# Patient Record
Sex: Male | Born: 1965 | Race: Black or African American | Hispanic: No | Marital: Single | Smoking: Current some day smoker
Health system: Southern US, Community
[De-identification: ages and names within clinical notes are randomized; demographics above are authoritative.]

## PROBLEM LIST (undated history)

## (undated) ENCOUNTER — Emergency Department (HOSPITAL_COMMUNITY): Payer: Self-pay | Source: Home / Self Care

## (undated) DIAGNOSIS — S9304XA Dislocation of right ankle joint, initial encounter: Secondary | ICD-10-CM

---

## 1997-08-23 ENCOUNTER — Emergency Department (HOSPITAL_COMMUNITY): Admission: EM | Admit: 1997-08-23 | Discharge: 1997-08-23 | Payer: Self-pay | Admitting: Emergency Medicine

## 1997-08-23 ENCOUNTER — Ambulatory Visit (HOSPITAL_BASED_OUTPATIENT_CLINIC_OR_DEPARTMENT_OTHER): Admission: RE | Admit: 1997-08-23 | Discharge: 1997-08-23 | Payer: Self-pay | Admitting: Orthopedic Surgery

## 1998-05-08 ENCOUNTER — Emergency Department (HOSPITAL_COMMUNITY): Admission: EM | Admit: 1998-05-08 | Discharge: 1998-05-08 | Payer: Self-pay | Admitting: Emergency Medicine

## 1998-05-08 ENCOUNTER — Encounter: Payer: Self-pay | Admitting: Emergency Medicine

## 1998-12-19 ENCOUNTER — Encounter: Payer: Self-pay | Admitting: *Deleted

## 1998-12-19 ENCOUNTER — Inpatient Hospital Stay (HOSPITAL_COMMUNITY): Admission: EM | Admit: 1998-12-19 | Discharge: 1998-12-23 | Payer: Self-pay | Admitting: *Deleted

## 1998-12-19 ENCOUNTER — Encounter: Payer: Self-pay | Admitting: General Surgery

## 1998-12-20 ENCOUNTER — Encounter: Payer: Self-pay | Admitting: Surgery

## 1998-12-20 ENCOUNTER — Encounter: Payer: Self-pay | Admitting: General Surgery

## 1998-12-21 ENCOUNTER — Encounter: Payer: Self-pay | Admitting: General Surgery

## 1998-12-22 ENCOUNTER — Encounter: Payer: Self-pay | Admitting: Surgery

## 1998-12-23 ENCOUNTER — Encounter: Payer: Self-pay | Admitting: Surgery

## 2007-03-18 ENCOUNTER — Emergency Department (HOSPITAL_COMMUNITY): Admission: EM | Admit: 2007-03-18 | Discharge: 2007-03-18 | Payer: Self-pay | Admitting: Emergency Medicine
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
## 2012-03-10 ENCOUNTER — Emergency Department (HOSPITAL_COMMUNITY)
Admission: EM | Admit: 2012-03-10 | Discharge: 2012-03-10 | Disposition: A | Discharge status: Home / Self Care | Payer: Self-pay | Attending: Emergency Medicine | Admitting: Emergency Medicine

## 2012-03-10 ENCOUNTER — Encounter (HOSPITAL_COMMUNITY): Payer: Self-pay | Admitting: Emergency Medicine

## 2012-03-10 DIAGNOSIS — H00019 Hordeolum externum unspecified eye, unspecified eyelid: Secondary | ICD-10-CM | POA: Insufficient documentation

## 2012-03-10 MED ORDER — ERYTHROMYCIN 5 MG/GM OP OINT
TOPICAL_OINTMENT | Freq: Four times a day (QID) | OPHTHALMIC | Status: DC
  Administered 2012-03-10: 11:00:00 via OPHTHALMIC
  Filled 2012-03-10: qty 1

## 2012-03-10 NOTE — ED Provider Notes (Signed)
History     CSN: [id number]  Arrival date & time 03/10/12  3086   First MD Initiated Contact with Patient 03/10/12 1000      Chief Complaint  Patient presents with  . Eye Pain    (Consider location/radiation/quality/duration/timing/severity/associated sxs/prior treatment) HPI Comments: 47 year old male presents emergency department complaining of right eyelid swelling worsening for the past 2 weeks. States he's been having intermittent pain on the right eyelid, worse with palpation. He has been trying to apply warm compresses without any change. No discharge. Denies difficulty seeing or any visual disturbance. No eye pain itself. Currently pain rated 4 out of 10 described as achy and dull. He has not tried any over-the-counter medications for his pain. Denies fever or chills.  Patient is a 47 y.o. male presenting with eye pain. The history is provided by the patient.  Eye Pain This is a new problem. The current episode started 1 to 4 weeks ago. The problem occurs constantly. The problem has been unchanged. Pertinent negatives include no visual change. Nothing aggravates the symptoms.    History reviewed. No pertinent past medical history.  History reviewed. No pertinent past surgical history.  No family history on file.  History  Substance Use Topics  . Smoking status: Never Smoker   . Smokeless tobacco: Not on file  . Alcohol Use: No      Review of Systems  Eyes: Negative for pain, discharge, redness and visual disturbance.  All other systems reviewed and are negative.    Allergies  Review of patient's allergies indicates no known allergies.  Home Medications  No current outpatient prescriptions on file.  BP 142/78  Pulse 98  Temp(Src) 97.8 F (36.6 C) (Oral)  SpO2 97%  Physical Exam  Nursing note and vitals reviewed. Constitutional: He is oriented to person, place, and time. He appears well-developed and well-nourished. No distress.  HENT:  Head:  Normocephalic and atraumatic.  Mouth/Throat: Oropharynx is clear and moist.  Eyes: Conjunctivae and EOM are normal. Pupils are equal, round, and reactive to light. Right eye exhibits hordeolum (tender to palpation).  Neck: Normal range of motion.  Cardiovascular: Normal rate, regular rhythm and normal heart sounds.   Pulmonary/Chest: Effort normal and breath sounds normal.  Musculoskeletal: Normal range of motion. He exhibits no edema.  Lymphadenopathy:    He has no cervical adenopathy.  Neurological: He is alert and oriented to person, place, and time.  Skin: Skin is warm and dry.  Psychiatric: He has a normal mood and affect. His behavior is normal.    ED Course  Procedures (including critical care time)  Labs Reviewed - No data to display No results found.   1. Hordeolum       MDM  47 year old male with external hordeolum. No visual disturbance. His eye itself is normal. Erythromycin ophthalmic ointment given. Advised warm compresses. He will followup with ophthalmology. Return precautions discussed. Patient states understanding of plan and is agreeable.       Trevor Mace, PA-C 03/10/12 1056

## 2012-03-10 NOTE — ED Notes (Signed)
Onset 2 weeks ago right eye lid swelling and has been constant with pain intermittent. Pain currently 4/10 achy dull.

## 2012-03-13 NOTE — ED Provider Notes (Signed)
Medical screening examination/treatment/procedure(s) were performed by non-physician practitioner and as supervising physician I was immediately available for consultation/collaboration.   Joya Gaskins, MD 03/13/12 [phone]

## 2018-05-25 ENCOUNTER — Emergency Department (HOSPITAL_COMMUNITY): Payer: No Typology Code available for payment source

## 2018-05-25 ENCOUNTER — Other Ambulatory Visit: Payer: Self-pay

## 2018-05-25 ENCOUNTER — Emergency Department (HOSPITAL_COMMUNITY)
Admission: EM | Admit: 2018-05-25 | Discharge: 2018-05-26 | Disposition: A | Discharge status: Home / Self Care | Payer: No Typology Code available for payment source | Attending: Emergency Medicine | Admitting: Emergency Medicine

## 2018-05-25 ENCOUNTER — Encounter (HOSPITAL_COMMUNITY): Payer: Self-pay

## 2018-05-25 DIAGNOSIS — S82851A Displaced trimalleolar fracture of right lower leg, initial encounter for closed fracture: Secondary | ICD-10-CM

## 2018-05-25 DIAGNOSIS — Y9355 Activity, bike riding: Secondary | ICD-10-CM | POA: Insufficient documentation

## 2018-05-25 DIAGNOSIS — S9304XA Dislocation of right ankle joint, initial encounter: Secondary | ICD-10-CM

## 2018-05-25 DIAGNOSIS — Y907 Blood alcohol level of 200-239 mg/100 ml: Secondary | ICD-10-CM | POA: Diagnosis not present

## 2018-05-25 DIAGNOSIS — M5030 Other cervical disc degeneration, unspecified cervical region: Secondary | ICD-10-CM | POA: Diagnosis not present

## 2018-05-25 DIAGNOSIS — Y999 Unspecified external cause status: Secondary | ICD-10-CM | POA: Diagnosis not present

## 2018-05-25 DIAGNOSIS — F10920 Alcohol use, unspecified with intoxication, uncomplicated: Secondary | ICD-10-CM | POA: Diagnosis not present

## 2018-05-25 DIAGNOSIS — R220 Localized swelling, mass and lump, head: Secondary | ICD-10-CM | POA: Diagnosis not present

## 2018-05-25 DIAGNOSIS — Q899 Congenital malformation, unspecified: Secondary | ICD-10-CM

## 2018-05-25 DIAGNOSIS — Y929 Unspecified place or not applicable: Secondary | ICD-10-CM | POA: Diagnosis not present

## 2018-05-25 DIAGNOSIS — S99911A Unspecified injury of right ankle, initial encounter: Secondary | ICD-10-CM | POA: Diagnosis present

## 2018-05-25 LAB — CBC
HCT: 42.4 % (ref 39.0–52.0)
Hemoglobin: 14.5 g/dL (ref 13.0–17.0)
MCH: 32.3 pg (ref 26.0–34.0)
MCHC: 34.2 g/dL (ref 30.0–36.0)
MCV: 94.4 fL (ref 80.0–100.0)
Platelets: 127 10*3/uL — ABNORMAL LOW (ref 150–400)
RBC: 4.49 MIL/uL (ref 4.22–5.81)
RDW: 13.2 % (ref 11.5–15.5)
WBC: 5.7 10*3/uL (ref 4.0–10.5)
nRBC: 0 % (ref 0.0–0.2)

## 2018-05-25 LAB — BASIC METABOLIC PANEL
Anion gap: 13 (ref 5–15)
BUN: 11 mg/dL (ref 6–20)
CO2: 19 mmol/L — ABNORMAL LOW (ref 22–32)
Calcium: 8.7 mg/dL — ABNORMAL LOW (ref 8.9–10.3)
Chloride: 99 mmol/L (ref 98–111)
Creatinine, Ser: 0.97 mg/dL (ref 0.61–1.24)
GFR calc Af Amer: >60 mL/min (ref >60)
GFR calc non Af Amer: >60 mL/min (ref >60)
Glucose, Bld: 96 mg/dL (ref 70–99)
Potassium: 4.1 mmol/L (ref 3.5–5.1)
Sodium: 131 mmol/L — ABNORMAL LOW (ref 135–145)

## 2018-05-25 LAB — ETHANOL: Alcohol, Ethyl (B): 218 mg/dL — ABNORMAL HIGH (ref <10)

## 2018-05-25 MED ORDER — ETOMIDATE 2 MG/ML IV SOLN
INTRAVENOUS | Status: AC | PRN
  Administered 2018-05-25: 12 mg via INTRAVENOUS

## 2018-05-25 MED ORDER — FENTANYL CITRATE (PF) 100 MCG/2ML IJ SOLN
100.0000 ug | Freq: Once | INTRAMUSCULAR | Status: AC
  Administered 2018-05-25: 100 ug via INTRAVENOUS
  Filled 2018-05-25: qty 2

## 2018-05-25 MED ORDER — FENTANYL CITRATE (PF) 100 MCG/2ML IJ SOLN: 100.0000 ug | Freq: Once | INTRAMUSCULAR | Status: DC

## 2018-05-25 MED ORDER — SODIUM CHLORIDE 0.9 % IV SOLN
INTRAVENOUS | Status: AC | PRN
  Administered 2018-05-25: 1000 mL via INTRAVENOUS

## 2018-05-25 MED ORDER — ETOMIDATE 2 MG/ML IV SOLN
20.0000 mg | Freq: Once | INTRAVENOUS | Status: DC
  Filled 2018-05-25: qty 10
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
## 2018-05-25 NOTE — ED Notes (Signed)
Patient transported to CT 

## 2018-05-25 NOTE — ED Notes (Signed)
Patient transported to X-ray 
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
## 2018-05-26 MED ORDER — OXYCODONE-ACETAMINOPHEN 5-325 MG PO TABS: 1.0000 | ORAL_TABLET | ORAL | 0 refills | Status: DC | PRN

## 2018-05-26 MED ORDER — METHOCARBAMOL 500 MG PO TABS
500.0000 mg | ORAL_TABLET | Freq: Once | ORAL | Status: AC
  Administered 2018-05-26: 500 mg via ORAL
  Filled 2018-05-26: qty 1

## 2018-05-26 MED ORDER — KETOROLAC TROMETHAMINE 30 MG/ML IJ SOLN
30.0000 mg | Freq: Once | INTRAMUSCULAR | Status: AC
  Administered 2018-05-26: 30 mg via INTRAVENOUS
  Filled 2018-05-26: qty 1

## 2018-05-26 MED ORDER — OXYCODONE HCL 5 MG PO TABS
5.0000 mg | ORAL_TABLET | Freq: Once | ORAL | Status: AC
  Administered 2018-05-26: 5 mg via ORAL
  Filled 2018-05-26: qty 1
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
## 2018-05-26 NOTE — ED Notes (Signed)
Patient is resting comfortably. 

## 2018-06-02 ENCOUNTER — Other Ambulatory Visit: Payer: Self-pay | Admitting: Orthopedic Surgery

## 2018-06-03 ENCOUNTER — Ambulatory Visit (HOSPITAL_BASED_OUTPATIENT_CLINIC_OR_DEPARTMENT_OTHER)
Admission: RE | Admit: 2018-06-03 | Discharge: 2018-06-03 | Disposition: A | Discharge status: Home / Self Care | Payer: No Typology Code available for payment source | Source: Ambulatory Visit | Attending: Orthopedic Surgery | Admitting: Orthopedic Surgery

## 2018-06-03 ENCOUNTER — Other Ambulatory Visit: Payer: Self-pay

## 2018-06-03 ENCOUNTER — Encounter (HOSPITAL_BASED_OUTPATIENT_CLINIC_OR_DEPARTMENT_OTHER): Payer: Self-pay | Admitting: *Deleted

## 2018-06-03 ENCOUNTER — Encounter (HOSPITAL_BASED_OUTPATIENT_CLINIC_OR_DEPARTMENT_OTHER)
Admission: RE | Disposition: A | Discharge status: Home / Self Care | Payer: Self-pay | Source: Ambulatory Visit | Attending: Orthopedic Surgery

## 2018-06-03 ENCOUNTER — Ambulatory Visit (HOSPITAL_BASED_OUTPATIENT_CLINIC_OR_DEPARTMENT_OTHER): Payer: No Typology Code available for payment source | Admitting: Certified Registered"

## 2018-06-03 DIAGNOSIS — S82851A Displaced trimalleolar fracture of right lower leg, initial encounter for closed fracture: Secondary | ICD-10-CM | POA: Diagnosis not present

## 2018-06-03 DIAGNOSIS — F17200 Nicotine dependence, unspecified, uncomplicated: Secondary | ICD-10-CM | POA: Insufficient documentation

## 2018-06-03 DIAGNOSIS — Z79899 Other long term (current) drug therapy: Secondary | ICD-10-CM | POA: Insufficient documentation

## 2018-06-03 DIAGNOSIS — S9304XA Dislocation of right ankle joint, initial encounter: Secondary | ICD-10-CM | POA: Diagnosis present

## 2018-06-03 DIAGNOSIS — S93491A Sprain of other ligament of right ankle, initial encounter: Secondary | ICD-10-CM | POA: Insufficient documentation

## 2018-06-03 HISTORY — DX: Dislocation of right ankle joint, initial encounter: S93.04XA

## 2018-06-03 HISTORY — PX: ORIF ANKLE FRACTURE: SHX5408

## 2018-06-03 SURGERY — OPEN REDUCTION INTERNAL FIXATION (ORIF) ANKLE FRACTURE
Anesthesia: General | Site: Ankle | Laterality: Right

## 2018-06-03 MED ORDER — EPHEDRINE SULFATE 50 MG/ML IJ SOLN
INTRAMUSCULAR | Status: DC | PRN
  Administered 2018-06-03 (×3): 10 mg via INTRAVENOUS

## 2018-06-03 MED ORDER — ONDANSETRON HCL 4 MG/2ML IJ SOLN
INTRAMUSCULAR | Status: DC | PRN
  Administered 2018-06-03: 4 mg via INTRAVENOUS

## 2018-06-03 MED ORDER — MIDAZOLAM HCL 2 MG/2ML IJ SOLN
1.0000 mg | INTRAMUSCULAR | Status: DC | PRN
  Administered 2018-06-03: 2 mg via INTRAVENOUS

## 2018-06-03 MED ORDER — LIDOCAINE 2% (20 MG/ML) 5 ML SYRINGE
INTRAMUSCULAR | Status: AC
  Filled 2018-06-03: qty 15

## 2018-06-03 MED ORDER — PROPOFOL 10 MG/ML IV BOLUS
INTRAVENOUS | Status: DC | PRN
  Administered 2018-06-03: 170 mg via INTRAVENOUS

## 2018-06-03 MED ORDER — LIDOCAINE 2% (20 MG/ML) 5 ML SYRINGE
INTRAMUSCULAR | Status: DC | PRN
  Administered 2018-06-03: 80 mg via INTRAVENOUS

## 2018-06-03 MED ORDER — MIDAZOLAM HCL 2 MG/2ML IJ SOLN
INTRAMUSCULAR | Status: AC
  Filled 2018-06-03: qty 2

## 2018-06-03 MED ORDER — MEPERIDINE HCL 25 MG/ML IJ SOLN: 6.2500 mg | INTRAMUSCULAR | Status: DC | PRN

## 2018-06-03 MED ORDER — PROPOFOL 500 MG/50ML IV EMUL
INTRAVENOUS | Status: DC | PRN
  Administered 2018-06-03: 25 ug/kg/min via INTRAVENOUS

## 2018-06-03 MED ORDER — CHLORHEXIDINE GLUCONATE 4 % EX LIQD: 60.0000 mL | Freq: Once | CUTANEOUS | Status: DC

## 2018-06-03 MED ORDER — LACTATED RINGERS IV SOLN
INTRAVENOUS | Status: DC
  Administered 2018-06-03 (×2): via INTRAVENOUS

## 2018-06-03 MED ORDER — FENTANYL CITRATE (PF) 100 MCG/2ML IJ SOLN
INTRAMUSCULAR | Status: AC
  Filled 2018-06-03: qty 2

## 2018-06-03 MED ORDER — ROPIVACAINE HCL 5 MG/ML IJ SOLN
INTRAMUSCULAR | Status: DC | PRN
  Administered 2018-06-03: 50 mL via PERINEURAL

## 2018-06-03 MED ORDER — OXYCODONE HCL 5 MG PO TABS: 5.0000 mg | ORAL_TABLET | Freq: Once | ORAL | Status: DC | PRN

## 2018-06-03 MED ORDER — CEFAZOLIN SODIUM-DEXTROSE 2-4 GM/100ML-% IV SOLN
2.0000 g | INTRAVENOUS | Status: AC
  Administered 2018-06-03: 13:00:00 2 g via INTRAVENOUS

## 2018-06-03 MED ORDER — HYDROMORPHONE HCL 1 MG/ML IJ SOLN: 0.2500 mg | INTRAMUSCULAR | Status: DC | PRN

## 2018-06-03 MED ORDER — SCOPOLAMINE 1 MG/3DAYS TD PT72: 1.0000 | MEDICATED_PATCH | Freq: Once | TRANSDERMAL | Status: DC | PRN

## 2018-06-03 MED ORDER — FENTANYL CITRATE (PF) 100 MCG/2ML IJ SOLN
50.0000 ug | INTRAMUSCULAR | Status: AC | PRN
  Administered 2018-06-03 (×2): 50 ug via INTRAVENOUS
  Administered 2018-06-03: 100 ug via INTRAVENOUS

## 2018-06-03 MED ORDER — OXYCODONE HCL 5 MG/5ML PO SOLN: 5.0000 mg | Freq: Once | ORAL | Status: DC | PRN

## 2018-06-03 MED ORDER — DEXAMETHASONE SODIUM PHOSPHATE 10 MG/ML IJ SOLN
INTRAMUSCULAR | Status: DC | PRN
  Administered 2018-06-03: 10 mg via INTRAVENOUS

## 2018-06-03 MED ORDER — CEFAZOLIN SODIUM-DEXTROSE 2-4 GM/100ML-% IV SOLN
INTRAVENOUS | Status: AC
  Filled 2018-06-03: qty 100

## 2018-06-03 MED ORDER — PROMETHAZINE HCL 25 MG/ML IJ SOLN: 6.2500 mg | INTRAMUSCULAR | Status: DC | PRN

## 2018-06-03 SURGICAL SUPPLY — 94 items
ANKLE SYNDEMOSIS ZIPTIGHT (Ankle) ×3 IMPLANT
BANDAGE ACE 4X5 VEL STRL LF (GAUZE/BANDAGES/DRESSINGS) ×3 IMPLANT
BANDAGE ACE 6X5 VEL STRL LF (GAUZE/BANDAGES/DRESSINGS) ×3 IMPLANT
BANDAGE ESMARK 6X9 LF (GAUZE/BANDAGES/DRESSINGS) ×1 IMPLANT
BIT DRILL 110X2.5XQCK CNCT (BIT) IMPLANT
BIT DRILL 2.5 (BIT) ×3
BIT DRILL 2.7XCANN QCK CNCT (BIT) IMPLANT
BIT DRILL CANN 2.7 (BIT) ×2
BIT DRILL CANN 2.7MM (BIT) ×1
BIT DRILL QC 110 3.5 (BIT) ×1
BIT DRILL QC 110 3.5MM (BIT) IMPLANT
BIT DRL 110X2.5XQCK CNCT (BIT) ×1
BIT DRL 2.7XCANN QCK CNCT (BIT) ×1
BLADE SURG 15 STRL LF DISP TIS (BLADE) ×3 IMPLANT
BLADE SURG 15 STRL SS (BLADE) ×9
BNDG CMPR 9X6 STRL LF SNTH (GAUZE/BANDAGES/DRESSINGS) ×1
BNDG COHESIVE 4X5 TAN STRL (GAUZE/BANDAGES/DRESSINGS) ×3 IMPLANT
BNDG ESMARK 6X9 LF (GAUZE/BANDAGES/DRESSINGS) ×3
CANISTER SUCT 1200ML W/VALVE (MISCELLANEOUS) ×3 IMPLANT
CLOSURE STERI-STRIP 1/2X4 (GAUZE/BANDAGES/DRESSINGS) ×1
CLSR STERI-STRIP ANTIMIC 1/2X4 (GAUZE/BANDAGES/DRESSINGS) ×1 IMPLANT
COVER BACK TABLE REUSABLE LG (DRAPES) ×3 IMPLANT
COVER WAND RF STERILE (DRAPES) IMPLANT
CUFF TOURN SGL QUICK 34 (TOURNIQUET CUFF)
CUFF TRNQT CYL 34X4.125X (TOURNIQUET CUFF) IMPLANT
DECANTER SPIKE VIAL GLASS SM (MISCELLANEOUS) IMPLANT
DRAPE C-ARM 42X72 X-RAY (DRAPES) IMPLANT
DRAPE C-ARMOR (DRAPES) ×2 IMPLANT
DRAPE EXTREMITY T 121X128X90 (DISPOSABLE) ×3 IMPLANT
DRAPE IMP U-DRAPE 54X76 (DRAPES) ×3 IMPLANT
DRAPE INCISE IOBAN 66X45 STRL (DRAPES) ×3 IMPLANT
DRAPE OEC MINIVIEW 54X84 (DRAPES) IMPLANT
DRAPE U-SHAPE 47X51 STRL (DRAPES) ×3 IMPLANT
DRILL BIT QC 110 3.5MM (BIT) ×3
DRSG ADAPTIC 3X8 NADH LF (GAUZE/BANDAGES/DRESSINGS) IMPLANT
DRSG PAD ABDOMINAL 8X10 ST (GAUZE/BANDAGES/DRESSINGS) ×6 IMPLANT
DURAPREP 26ML APPLICATOR (WOUND CARE) ×3 IMPLANT
ELECT REM PT RETURN 9FT ADLT (ELECTROSURGICAL) ×3
ELECTRODE REM PT RTRN 9FT ADLT (ELECTROSURGICAL) ×1 IMPLANT
GAUZE SPONGE 4X4 12PLY STRL (GAUZE/BANDAGES/DRESSINGS) ×3 IMPLANT
GLOVE BIO SURGEON STRL SZ 6 (GLOVE) ×2 IMPLANT
GLOVE BIO SURGEON STRL SZ8 (GLOVE) ×3 IMPLANT
GLOVE BIOGEL M 6.5 STRL (GLOVE) ×3 IMPLANT
GLOVE BIOGEL PI IND STRL 8 (GLOVE) ×2 IMPLANT
GLOVE BIOGEL PI INDICATOR 8 (GLOVE) ×4
GLOVE ECLIPSE 6.5 STRL STRAW (GLOVE) ×2 IMPLANT
GLOVE ORTHO TXT STRL SZ7.5 (GLOVE) ×3 IMPLANT
GOWN STRL REUS W/ TWL LRG LVL3 (GOWN DISPOSABLE) ×1 IMPLANT
GOWN STRL REUS W/ TWL XL LVL3 (GOWN DISPOSABLE) ×2 IMPLANT
GOWN STRL REUS W/TWL LRG LVL3 (GOWN DISPOSABLE) ×3
GOWN STRL REUS W/TWL XL LVL3 (GOWN DISPOSABLE) ×6
K-WIRE ACE 1.6X6 (WIRE) ×3
KWIRE ACE 1.6X6 (WIRE) ×1 IMPLANT
NEEDLE HYPO 25X1 1.5 SAFETY (NEEDLE) IMPLANT
NS IRRIG 1000ML POUR BTL (IV SOLUTION) ×3 IMPLANT
PACK BASIN DAY SURGERY FS (CUSTOM PROCEDURE TRAY) ×3 IMPLANT
PAD CAST 4YDX4 CTTN HI CHSV (CAST SUPPLIES) ×2 IMPLANT
PADDING CAST COTTON 4X4 STRL (CAST SUPPLIES) ×6
PENCIL BUTTON HOLSTER BLD 10FT (ELECTRODE) ×3 IMPLANT
PLATE 7HOLE 1/3 TUBULAR (Plate) ×2 IMPLANT
SCREW CANC 2.5XFT 16X4XST SM (Screw) ×1 IMPLANT
SCREW CANC 4.0X16 (Screw) ×3 IMPLANT
SCREW CANCELLOUS 4.0X18 (Screw) ×2 IMPLANT
SCREW CANN 1/3 THRD RVRS CT (Screw) ×1 IMPLANT
SCREW CANNULATED 4.0X40 (Screw) ×3 IMPLANT
SCREW CORT 2.5X20X3.5XST SM (Screw) ×1 IMPLANT
SCREW CORTICAL 3.5 16MM (Screw) ×6 IMPLANT
SCREW CORTICAL 3.5X20 (Screw) ×3 IMPLANT
SHEET MEDIUM DRAPE 40X70 STRL (DRAPES) IMPLANT
SLEEVE SCD COMPRESS KNEE MED (MISCELLANEOUS) ×3 IMPLANT
SPLINT FAST PLASTER 5X30 (CAST SUPPLIES) ×2
SPLINT PLASTER CAST FAST 5X30 (CAST SUPPLIES) IMPLANT
SPONGE LAP 4X18 RFD (DISPOSABLE) ×3 IMPLANT
STAPLER VISISTAT 35W (STAPLE) IMPLANT
SUCTION FRAZIER HANDLE 10FR (MISCELLANEOUS) ×2
SUCTION TUBE FRAZIER 10FR DISP (MISCELLANEOUS) ×1 IMPLANT
SUT ETHILON 3 0 PS 1 (SUTURE) IMPLANT
SUT ETHILON 4 0 PS 2 18 (SUTURE) IMPLANT
SUT MNCRL AB 4-0 PS2 18 (SUTURE) IMPLANT
SUT VIC AB 0 CT1 27 (SUTURE) ×3
SUT VIC AB 0 CT1 27XBRD ANBCTR (SUTURE) IMPLANT
SUT VIC AB 2-0 SH 18 (SUTURE) IMPLANT
SUT VIC AB 3-0 SH 27 (SUTURE)
SUT VIC AB 3-0 SH 27X BRD (SUTURE) IMPLANT
SUT VICRYL 3-0 CR8 SH (SUTURE) ×2 IMPLANT
SYR BULB 3OZ (MISCELLANEOUS) ×3 IMPLANT
SYR CONTROL 10ML LL (SYRINGE) IMPLANT
SYSTEM FIXATN ANKL SYNDESMOSIS (Ankle) IMPLANT
TOWEL GREEN STERILE FF (TOWEL DISPOSABLE) ×3 IMPLANT
TUBE CONNECTING 20'X1/4 (TUBING) ×1
TUBE CONNECTING 20X1/4 (TUBING) ×2 IMPLANT
UNDERPAD 30X30 (UNDERPADS AND DIAPERS) ×3 IMPLANT
WASHER SM (Washer) ×2 IMPLANT
YANKAUER SUCT BULB TIP NO VENT (SUCTIONS) ×3 IMPLANT

## 2018-06-03 NOTE — Discharge Instructions (Signed)
Diet: As you were doing prior to hospitalization   Shower:  May shower but keep the wounds dry, use an occlusive plastic wrap, NO SOAKING IN TUB.  If the bandage gets wet, change with a clean dry gauze.  If you have a splint on, leave the splint in place and keep the splint dry with a plastic bag.  Dressing:  You may change your dressing 3-5 days after surgery, unless you have a splint.  If you have a splint, then just leave the splint in place and we will change your bandages during your first follow-up appointment.    If you had hand or foot surgery, we will plan to remove your stitches in about 2 weeks in the office.  For all other surgeries, there are sticky tapes (steri-strips) on your wounds and all the stitches are absorbable.  Leave the steri-strips in place when changing your dressings, they will peel off with time, usually 2-3 weeks.  Activity:  Increase activity slowly as tolerated, but follow the weight bearing instructions below.  The rules on driving is that you can not be taking narcotics while you drive, and you must feel in control of the vehicle.    Weight Bearing:   No weight on right leg, keep elevated at all times.    To prevent constipation: you may use a stool softener such as -  Colace (over the counter) 100 mg by mouth twice a day  Drink plenty of fluids (prune juice may be helpful) and high fiber foods Miralax (over the counter) for constipation as needed.    Itching:  If you experience itching with your medications, try taking only a single pain pill, or even half a pain pill at a time.  You may take up to 10 pain pills per day, and you can also use benadryl over the counter for itching or also to help with sleep.   Precautions:  If you experience chest pain or shortness of breath - call 911 immediately for transfer to the hospital emergency department!!  If you develop a fever greater that 101 F, purulent drainage from wound, increased redness or drainage from wound,  or calf pain -- Call the office at [phone]                                                Follow- Up Appointment:  Please call for an appointment to [place] [phone]     Post Anesthesia Home Care Instructions  Activity: Get plenty of rest for the remainder of the day. A responsible individual must stay with you for 24 hours following the procedure.  For the next 24 hours, DO NOT: -Drive a car -Advertising copywriterperate machinery -Drink alcoholic beverages -Take any medication unless instructed by your physician -Make any legal decisions or sign important papers.  Meals: Start with liquid foods such as gelatin or soup. Progress to regular foods as tolerated. Avoid greasy, spicy, heavy foods. If nausea and/or vomiting occur, drink only clear liquids until the nausea and/or vomiting subsides. Call your physician if vomiting continues.  Special Instructions/Symptoms: Your throat may feel dry or sore from the anesthesia or the breathing tube placed in your throat during surgery. If this causes discomfort, gargle with warm salt water. The discomfort should disappear within 24 hours.  If you had a scopolamine patch  placed behind your ear for the management of post- operative nausea and/or vomiting:  1. The medication in the patch is effective for 72 hours, after which it should be removed.  Wrap patch in a tissue and discard in the trash. Wash hands thoroughly with soap and water. 2. You may remove the patch earlier than 72 hours if you experience unpleasant side effects which may include dry mouth, dizziness or visual disturbances. 3. Avoid touching the patch. Wash your hands with soap and water after contact with the patch.     Regional Anesthesia Blocks  1. Numbness or the inability to move the "blocked" extremity may last from 3-48 hours after placement. The length of time depends on the medication injected and your individual response to the medication. If the numbness  is not going away after 48 hours, call your surgeon.  2. The extremity that is blocked will need to be protected until the numbness is gone and the  Strength has returned. Because you cannot feel it, you will need to take extra care to avoid injury. Because it may be weak, you may have difficulty moving it or using it. You may not know what position it is in without looking at it while the block is in effect.  3. For blocks in the legs and feet, returning to weight bearing and walking needs to be done carefully. You will need to wait until the numbness is entirely gone and the strength has returned. You should be able to move your leg and foot normally before you try and bear weight or walk. You will need someone to be with you when you first try to ensure you do not fall and possibly risk injury.  4. Bruising and tenderness at the needle site are common side effects and will resolve in a few days.  5. Persistent numbness or new problems with movement should be communicated to the surgeon or the [facility] [phone] [facility] [phone]).

## 2018-06-03 NOTE — Transfer of Care (Signed)
Immediate Anesthesia Transfer of Care Note  Patient: Russell [name]  Procedure(s) Performed: OPEN REDUCTION INTERNAL FIXATION (ORIF) TRIMALLEOLAR ANKLE FRACTURE WITH SYNDESMOSIS (Right Ankle)  Patient Location: PACU  Anesthesia Type:GA combined with regional for post-op pain  Level of Consciousness: drowsy and patient cooperative  Airway & Oxygen Therapy: Patient Spontanous Breathing and Patient connected to nasal cannula oxygen  Post-op Assessment: Report given to RN and Post -op Vital signs reviewed and stable  Post vital signs: Reviewed and stable  Last Vitals:  Vitals Value Taken Time  BP    Temp    Pulse 83 06/03/2018  2:56 PM  Resp    SpO2 100 % 06/03/2018  2:56 PM  Vitals shown include unvalidated device data.  Last Pain:  Vitals:   06/03/18 1123  TempSrc: Oral  PainSc: 10-Worst pain ever         Complications: No apparent anesthesia complications
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
## 2018-06-03 NOTE — Anesthesia Procedure Notes (Signed)
Anesthesia Regional Block: Adductor canal block   Pre-Anesthetic Checklist: ,, timeout performed, Correct Patient, Correct Site, Correct Laterality, Correct Procedure, Correct Position, site marked, Risks and benefits discussed,  Surgical consent,  Pre-op evaluation,  At surgeon's request and post-op pain management  Laterality: Right  Prep: chloraprep       Needles:  Injection technique: Single-shot  Needle Type: Stimiplex     Needle Length: 9cm  Needle Gauge: 21     Additional Needles:   Procedures:,,,, ultrasound used (permanent image in chart),,,,  Narrative:  Start time: 06/03/2018 12:02 PM End time: 06/03/2018 12:07 PM Injection made incrementally with aspirations every 5 mL.  Performed by: Personally  Anesthesiologist: Lowella Curb, MD
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
## 2018-06-03 NOTE — Progress Notes (Signed)
Assisted Dr. Miller with right, ultrasound guided, popliteal, adductor canal block. Side rails up, monitors on throughout procedure. See vital signs in flow sheet. Tolerated Procedure well. 

## 2018-06-03 NOTE — Anesthesia Procedure Notes (Signed)
Procedure Name: LMA Insertion Date/Time: 06/03/2018 1:10 PM Performed by: Gar Gibbon, CRNA Pre-anesthesia Checklist: Patient identified, Emergency Drugs available, Suction available and Patient being monitored Patient Re-evaluated:Patient Re-evaluated prior to induction Oxygen Delivery Method: Circle system utilized Preoxygenation: Pre-oxygenation with 100% oxygen Induction Type: IV induction Ventilation: Mask ventilation without difficulty LMA: LMA inserted LMA Size: 4.0 Number of attempts: 1 Airway Equipment and Method: Bite block Placement Confirmation: positive ETCO2 Tube secured with: Tape Dental Injury: Teeth and Oropharynx as per pre-operative assessment

## 2018-06-03 NOTE — Anesthesia Procedure Notes (Signed)
Anesthesia Regional Block: Popliteal block   Pre-Anesthetic Checklist: ,, timeout performed, Correct Patient, Correct Site, Correct Laterality, Correct Procedure, Correct Position, site marked, Risks and benefits discussed,  Surgical consent,  Pre-op evaluation,  At surgeon's request and post-op pain management  Laterality: Right  Prep: chloraprep       Needles:  Injection technique: Single-shot  Needle Type: Stimiplex     Needle Length: 9cm  Needle Gauge: 21     Additional Needles:   Procedures:,,,, ultrasound used (permanent image in chart),,,,  Narrative:  Start time: 06/03/2018 12:03 PM End time: 06/03/2018 12:08 PM Injection made incrementally with aspirations every 5 mL.  Performed by: Personally  Anesthesiologist: Lowella Curb, MD

## 2018-06-03 NOTE — Anesthesia Preprocedure Evaluation (Signed)
Anesthesia Evaluation  Patient identified by MRN, date of birth, ID band Patient awake    Reviewed: Allergy & Precautions, NPO status , Patient's Chart, lab work & pertinent test results  Airway Mallampati: II  TM Distance: >3 FB Neck ROM: Full    Dental no notable dental hx.    Pulmonary neg pulmonary ROS, Current Smoker,    Pulmonary exam normal breath sounds clear to auscultation       Cardiovascular negative cardio ROS Normal cardiovascular exam Rhythm:Regular Rate:Normal     Neuro/Psych negative neurological ROS  negative psych ROS   GI/Hepatic negative GI ROS, Neg liver ROS,   Endo/Other  negative endocrine ROS  Renal/GU negative Renal ROS  negative genitourinary   Musculoskeletal negative musculoskeletal ROS (+)   Abdominal   Peds negative pediatric ROS (+)  Hematology negative hematology ROS (+)   Anesthesia Other Findings   Reproductive/Obstetrics negative OB ROS                             Anesthesia Physical Anesthesia Plan  ASA: II  Anesthesia Plan: General   Post-op Pain Management:  Regional for Post-op pain   Induction: Intravenous  PONV Risk Score and Plan: 1 and Ondansetron  Airway Management Planned: LMA  Additional Equipment:   Intra-op Plan:   Post-operative Plan: Extubation in OR  Informed Consent: I have reviewed the patients History and Physical, chart, labs and discussed the procedure including the risks, benefits and alternatives for the proposed anesthesia with the patient or authorized representative who has indicated his/her understanding and acceptance.     Dental advisory given  Plan Discussed with: CRNA  Anesthesia Plan Comments:         Anesthesia Quick Evaluation

## 2018-06-03 NOTE — Anesthesia Postprocedure Evaluation (Signed)
Anesthesia Post Note  Patient: Russell [name]  Procedure(s) Performed: OPEN REDUCTION INTERNAL FIXATION (ORIF) TRIMALLEOLAR ANKLE FRACTURE WITH SYNDESMOSIS (Right Ankle)     Patient location during evaluation: PACU Anesthesia Type: General Level of consciousness: awake and alert Pain management: pain level controlled Vital Signs Assessment: post-procedure vital signs reviewed and stable Respiratory status: spontaneous breathing, nonlabored ventilation and respiratory function stable Cardiovascular status: blood pressure returned to baseline and stable Postop Assessment: no apparent nausea or vomiting Anesthetic complications: no    Last Vitals:  Vitals:   06/03/18 1500 06/03/18 1515  BP: 108/64 120/72  Pulse: 77 73  Resp: 13 12  Temp:    SpO2: 100% 100%    Last Pain:  Vitals:   06/03/18 1515  TempSrc:   PainSc: Asleep                 Lowella Curb

## 2018-06-04 ENCOUNTER — Encounter (HOSPITAL_BASED_OUTPATIENT_CLINIC_OR_DEPARTMENT_OTHER): Payer: Self-pay | Admitting: Orthopedic Surgery

## 2019-12-04 ENCOUNTER — Encounter (HOSPITAL_COMMUNITY): Payer: Self-pay | Admitting: Emergency Medicine

## 2019-12-04 ENCOUNTER — Ambulatory Visit (HOSPITAL_COMMUNITY)
Admission: EM | Admit: 2019-12-04 | Discharge: 2019-12-04 | Disposition: A | Discharge status: Home / Self Care | Payer: Self-pay | Attending: Urgent Care | Admitting: Urgent Care

## 2019-12-04 ENCOUNTER — Other Ambulatory Visit: Payer: Self-pay

## 2019-12-04 DIAGNOSIS — K1379 Other lesions of oral mucosa: Secondary | ICD-10-CM

## 2019-12-04 DIAGNOSIS — B029 Zoster without complications: Secondary | ICD-10-CM

## 2019-12-04 DIAGNOSIS — R519 Headache, unspecified: Secondary | ICD-10-CM

## 2019-12-04 DIAGNOSIS — R21 Rash and other nonspecific skin eruption: Secondary | ICD-10-CM

## 2019-12-04 MED ORDER — NAPROXEN 500 MG PO TABS: 500.0000 mg | ORAL_TABLET | Freq: Two times a day (BID) | ORAL | 0 refills | Status: DC

## 2019-12-04 MED ORDER — VALACYCLOVIR HCL 1 G PO TABS: 1000.0000 mg | ORAL_TABLET | Freq: Three times a day (TID) | ORAL | 0 refills | Status: DC

## 2019-12-04 NOTE — ED Triage Notes (Signed)
Pt presents with right sided tooth pain xs 3 days. Pt also complains of rash on face xs 3 days. States "took pills that a friend gave him for pain and rash started after taking medication"

## 2019-12-04 NOTE — ED Provider Notes (Signed)
[facility]   MRN: [id number] DOB: 1965-09-30  Subjective:   ODYN TURKO is a 54 y.o. male presenting for 2-3 day history of acute onset right-sided dental pain.  Patient states that he took "pills that a friend gave him throat pain" and developed a rash thereafter.  Denies difficulty chewing but admits that the rash is painful and not itchy.  Denies ear pain, eye pain, cough, sore throat.  Patient did have chickenpox as a child.  No current facility-administered medications for this encounter.  Current Outpatient Medications:    acetaminophen (TYLENOL) 500 MG tablet, Take 1,000 mg by mouth every 6 (six) hours as needed., Disp: , Rfl:    oxyCODONE-acetaminophen (PERCOCET) 5-325 MG tablet, Take 1 tablet by mouth every 4 (four) hours as needed., Disp: 20 tablet, Rfl: 0   No Known Allergies  Past Medical History:  Diagnosis Date   Closed dislocation of right ankle 06/03/2018     Past Surgical History:  Procedure Laterality Date   ORIF ANKLE FRACTURE Right 06/03/2018   Procedure: OPEN REDUCTION INTERNAL FIXATION (ORIF) TRIMALLEOLAR ANKLE FRACTURE WITH SYNDESMOSIS;  Surgeon: Teryl Lucy, MD;  Location: [facility];  Service: Orthopedics;  Laterality: Right;    History reviewed. No pertinent family history.  Social History   Tobacco Use   Smoking status: Current Some Day Smoker   Smokeless tobacco: Never Used  Vaping Use   Vaping Use: Never used  Substance Use Topics   Alcohol use: Yes    Comment: beer daily   Drug use: Yes    Types: Marijuana    Comment: last was last week    ROS   Objective:   Vitals: BP 140/77 (BP Location: Left Arm)    Pulse 84    Temp 99.3 F (37.4 C) (Oral)    Resp 18    SpO2 100%   Physical Exam Constitutional:      General: He is not in acute distress.    Appearance: Normal appearance. He is well-developed and normal weight. He is not ill-appearing, toxic-appearing or diaphoretic.  HENT:      Head: Normocephalic and atraumatic.     Right Ear: Tympanic membrane, ear canal and external ear normal. There is no impacted cerumen.     Left Ear: Tympanic membrane, ear canal and external ear normal. There is no impacted cerumen.     Nose: Nose normal. No congestion or rhinorrhea.     Mouth/Throat:     Mouth: Mucous membranes are moist.     Dentition: Dental caries present. No dental tenderness, gingival swelling or dental abscesses.     Pharynx: Oropharynx is clear. No oropharyngeal exudate or posterior oropharyngeal erythema.  Eyes:     General: No scleral icterus.       Right eye: No discharge.        Left eye: No discharge.     Extraocular Movements: Extraocular movements intact.     Conjunctiva/sclera: Conjunctivae normal.     Pupils: Pupils are equal, round, and reactive to light.  Cardiovascular:     Rate and Rhythm: Normal rate.  Pulmonary:     Effort: Pulmonary effort is normal.  Musculoskeletal:     Cervical back: Normal range of motion and neck supple. No rigidity. No muscular tenderness.  Skin:    General: Skin is warm and dry.     Comments: Multiple clusters of vesicular lesions over right side of lower face extending internally into the right side of  his mouth not crossing the midline but comes right up to it.  He has 2 separate clusters over the superior surface of his scalp bordering the ear and temporal area on the right side.  Neurological:     General: No focal deficit present.     Mental Status: He is alert and oriented to person, place, and time.  Psychiatric:        Mood and Affect: Mood normal.        Behavior: Behavior normal.        Thought Content: Thought content normal.        Judgment: Judgment normal.             Assessment and Plan :   PDMP not reviewed this encounter.  1. Herpes zoster without complication   2. Facial pain   3. Oral pain   4. Facial rash     Suspect the patient is experiencing pain from his shingles rash as  opposed to a dental source.  Counseled that he could try to contact the dental practice to establish care and obtain a consult, information provided to him.  Patient reported that he had limited resources and wants to limit the amount of medications that he would take.  As such I emphasized need to start Valtrex, naproxen. Counseled patient on potential for adverse effects with medications prescribed/recommended today, ER and return-to-clinic precautions discussed, patient verbalized understanding.    Wallis Bamberg, PA-C 12/04/19 1530

## 2019-12-04 NOTE — Discharge Instructions (Addendum)
[facility] [phone] extension [phone]  Dr[facility] Civils [phone] [address]  [facility] [phone] [address]  [facility] [phone] extension [phone] [place], [place], [address] First come first serve for the first 10 clients.  May do simple extractions only, no wisdom teeth or surgery.  You may try the second for Thursday of the month starting at 6:30 AM.  [employer] You may call the school to see if they are still helping to provide dental care for emergent cases.  
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
## 2022-10-15 ENCOUNTER — Ambulatory Visit (INDEPENDENT_AMBULATORY_CARE_PROVIDER_SITE_OTHER): Payer: Medicaid Other

## 2022-10-15 ENCOUNTER — Encounter (HOSPITAL_COMMUNITY): Payer: Self-pay

## 2022-10-15 ENCOUNTER — Ambulatory Visit (HOSPITAL_COMMUNITY)
Admission: EM | Admit: 2022-10-15 | Discharge: 2022-10-15 | Disposition: A | Discharge status: Home / Self Care | Payer: Medicaid Other | Attending: Sports Medicine | Admitting: Sports Medicine

## 2022-10-15 DIAGNOSIS — S52202A Unspecified fracture of shaft of left ulna, initial encounter for closed fracture: Secondary | ICD-10-CM

## 2022-10-15 DIAGNOSIS — S5292XA Unspecified fracture of left forearm, initial encounter for closed fracture: Secondary | ICD-10-CM

## 2022-10-15 NOTE — Discharge Instructions (Signed)
You have 2 fractures in the ulna bone of your left forearm.  You are placed in a immobilizing sugar-tong splint today and need to keep this arm immobilized until your transition to a cast.  The timeframe to transition to a cast is in the next 5 to 7 days.  I have referred you to [facility] their contact information is below.  If you have not heard anything from them today or tomorrow please call them to schedule a follow-up visit for early next week to transition to a cast.  I recommend taking Tylenol and ibuprofen from a pain control standpoint.  [facility] Address: [address], [place], [place] [address] Phone: [phone]
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
## 2022-10-15 NOTE — Progress Notes (Signed)
Orthopedic Tech Progress Note Patient Details:  Russell [name] 1965-07-26 [id number]  Ortho Devices Type of Ortho Device: Sugartong splint, Arm sling Ortho Device/Splint Location: LUE Ortho Device/Splint Interventions: Ordered, Application, Adjustment   Post Interventions Patient Tolerated: Well Instructions Provided: Care of device  [name] 10/15/2022, 1:37 PM

## 2022-12-09 ENCOUNTER — Emergency Department (HOSPITAL_BASED_OUTPATIENT_CLINIC_OR_DEPARTMENT_OTHER)
Admission: EM | Admit: 2022-12-09 | Discharge: 2022-12-09 | Discharge status: Against Medical Advice | Payer: Commercial Managed Care - HMO | Attending: Emergency Medicine | Admitting: Emergency Medicine

## 2022-12-09 ENCOUNTER — Other Ambulatory Visit: Payer: Self-pay

## 2022-12-09 ENCOUNTER — Encounter (HOSPITAL_BASED_OUTPATIENT_CLINIC_OR_DEPARTMENT_OTHER): Payer: Self-pay | Admitting: Emergency Medicine

## 2022-12-09 DIAGNOSIS — R059 Cough, unspecified: Secondary | ICD-10-CM | POA: Insufficient documentation

## 2022-12-09 DIAGNOSIS — R0602 Shortness of breath: Secondary | ICD-10-CM | POA: Insufficient documentation

## 2022-12-09 DIAGNOSIS — Z5321 Procedure and treatment not carried out due to patient leaving prior to being seen by health care provider: Secondary | ICD-10-CM | POA: Diagnosis not present

## 2022-12-09 NOTE — ED Triage Notes (Signed)
Short of breath has been going on for weeks. No fevers, pt has coughing , nonproductive.

## 2022-12-09 NOTE — ED Triage Notes (Signed)
Pt would not continue questions for triage. Pt states he wants to be in and out,.

## 2023-02-03 ENCOUNTER — Other Ambulatory Visit: Payer: Self-pay

## 2023-02-03 ENCOUNTER — Emergency Department (HOSPITAL_COMMUNITY): Payer: Commercial Managed Care - HMO

## 2023-02-03 ENCOUNTER — Inpatient Hospital Stay (HOSPITAL_COMMUNITY)
Admission: EM | Admit: 2023-02-03 | Discharge: 2023-04-04 | DRG: 974 | Disposition: A | Discharge status: Home / Self Care | Payer: Commercial Managed Care - HMO | Attending: Internal Medicine | Admitting: Internal Medicine

## 2023-02-03 ENCOUNTER — Inpatient Hospital Stay (HOSPITAL_COMMUNITY): Payer: Commercial Managed Care - HMO

## 2023-02-03 ENCOUNTER — Encounter (HOSPITAL_COMMUNITY): Payer: Self-pay

## 2023-02-03 DIAGNOSIS — E871 Hypo-osmolality and hyponatremia: Secondary | ICD-10-CM | POA: Diagnosis present

## 2023-02-03 DIAGNOSIS — Z560 Unemployment, unspecified: Secondary | ICD-10-CM

## 2023-02-03 DIAGNOSIS — G9341 Metabolic encephalopathy: Secondary | ICD-10-CM | POA: Diagnosis present

## 2023-02-03 DIAGNOSIS — I700 Atherosclerosis of aorta: Secondary | ICD-10-CM | POA: Diagnosis present

## 2023-02-03 DIAGNOSIS — I50810 Right heart failure, unspecified: Secondary | ICD-10-CM | POA: Diagnosis present

## 2023-02-03 DIAGNOSIS — R6521 Severe sepsis with septic shock: Secondary | ICD-10-CM

## 2023-02-03 DIAGNOSIS — A528 Late syphilis, latent: Secondary | ICD-10-CM | POA: Diagnosis present

## 2023-02-03 DIAGNOSIS — R319 Hematuria, unspecified: Secondary | ICD-10-CM | POA: Insufficient documentation

## 2023-02-03 DIAGNOSIS — R569 Unspecified convulsions: Secondary | ICD-10-CM

## 2023-02-03 DIAGNOSIS — I272 Pulmonary hypertension, unspecified: Secondary | ICD-10-CM | POA: Diagnosis not present

## 2023-02-03 DIAGNOSIS — F1020 Alcohol dependence, uncomplicated: Secondary | ICD-10-CM

## 2023-02-03 DIAGNOSIS — L210 Seborrhea capitis: Secondary | ICD-10-CM | POA: Diagnosis present

## 2023-02-03 DIAGNOSIS — A403 Sepsis due to Streptococcus pneumoniae: Secondary | ICD-10-CM | POA: Diagnosis present

## 2023-02-03 DIAGNOSIS — B182 Chronic viral hepatitis C: Secondary | ICD-10-CM | POA: Diagnosis present

## 2023-02-03 DIAGNOSIS — B1920 Unspecified viral hepatitis C without hepatic coma: Secondary | ICD-10-CM | POA: Diagnosis not present

## 2023-02-03 DIAGNOSIS — F1410 Cocaine abuse, uncomplicated: Secondary | ICD-10-CM | POA: Diagnosis present

## 2023-02-03 DIAGNOSIS — E440 Moderate protein-calorie malnutrition: Secondary | ICD-10-CM | POA: Insufficient documentation

## 2023-02-03 DIAGNOSIS — K7460 Unspecified cirrhosis of liver: Secondary | ICD-10-CM | POA: Diagnosis present

## 2023-02-03 DIAGNOSIS — J13 Pneumonia due to Streptococcus pneumoniae: Secondary | ICD-10-CM | POA: Diagnosis not present

## 2023-02-03 DIAGNOSIS — Z1152 Encounter for screening for COVID-19: Secondary | ICD-10-CM

## 2023-02-03 DIAGNOSIS — R768 Other specified abnormal immunological findings in serum: Secondary | ICD-10-CM

## 2023-02-03 DIAGNOSIS — D638 Anemia in other chronic diseases classified elsewhere: Secondary | ICD-10-CM | POA: Diagnosis present

## 2023-02-03 DIAGNOSIS — I110 Hypertensive heart disease with heart failure: Secondary | ICD-10-CM | POA: Diagnosis present

## 2023-02-03 DIAGNOSIS — E785 Hyperlipidemia, unspecified: Secondary | ICD-10-CM | POA: Diagnosis present

## 2023-02-03 DIAGNOSIS — F0282 Dementia in other diseases classified elsewhere, unspecified severity, with psychotic disturbance: Secondary | ICD-10-CM | POA: Diagnosis present

## 2023-02-03 DIAGNOSIS — F0390 Unspecified dementia without behavioral disturbance: Secondary | ICD-10-CM | POA: Diagnosis not present

## 2023-02-03 DIAGNOSIS — E872 Acidosis, unspecified: Secondary | ICD-10-CM | POA: Diagnosis present

## 2023-02-03 DIAGNOSIS — B953 Streptococcus pneumoniae as the cause of diseases classified elsewhere: Secondary | ICD-10-CM

## 2023-02-03 DIAGNOSIS — I5189 Other ill-defined heart diseases: Secondary | ICD-10-CM

## 2023-02-03 DIAGNOSIS — D696 Thrombocytopenia, unspecified: Secondary | ICD-10-CM | POA: Diagnosis not present

## 2023-02-03 DIAGNOSIS — Z5900 Homelessness unspecified: Secondary | ICD-10-CM | POA: Diagnosis not present

## 2023-02-03 DIAGNOSIS — Z5901 Sheltered homelessness: Secondary | ICD-10-CM

## 2023-02-03 DIAGNOSIS — G9340 Encephalopathy, unspecified: Secondary | ICD-10-CM | POA: Diagnosis present

## 2023-02-03 DIAGNOSIS — Z5320 Procedure and treatment not carried out because of patient's decision for unspecified reasons: Secondary | ICD-10-CM | POA: Diagnosis present

## 2023-02-03 DIAGNOSIS — D6959 Other secondary thrombocytopenia: Secondary | ICD-10-CM | POA: Diagnosis present

## 2023-02-03 DIAGNOSIS — A530 Latent syphilis, unspecified as early or late: Secondary | ICD-10-CM | POA: Diagnosis present

## 2023-02-03 DIAGNOSIS — I2721 Secondary pulmonary arterial hypertension: Secondary | ICD-10-CM | POA: Diagnosis present

## 2023-02-03 DIAGNOSIS — F17210 Nicotine dependence, cigarettes, uncomplicated: Secondary | ICD-10-CM | POA: Diagnosis not present

## 2023-02-03 DIAGNOSIS — H1089 Other conjunctivitis: Secondary | ICD-10-CM | POA: Diagnosis present

## 2023-02-03 DIAGNOSIS — A419 Sepsis, unspecified organism: Secondary | ICD-10-CM | POA: Diagnosis present

## 2023-02-03 DIAGNOSIS — N179 Acute kidney failure, unspecified: Secondary | ICD-10-CM | POA: Diagnosis present

## 2023-02-03 DIAGNOSIS — B351 Tinea unguium: Secondary | ICD-10-CM

## 2023-02-03 DIAGNOSIS — R451 Restlessness and agitation: Secondary | ICD-10-CM

## 2023-02-03 DIAGNOSIS — G319 Degenerative disease of nervous system, unspecified: Secondary | ICD-10-CM | POA: Diagnosis not present

## 2023-02-03 DIAGNOSIS — J154 Pneumonia due to other streptococci: Secondary | ICD-10-CM | POA: Diagnosis not present

## 2023-02-03 DIAGNOSIS — J189 Pneumonia, unspecified organism: Principal | ICD-10-CM | POA: Diagnosis present

## 2023-02-03 DIAGNOSIS — L219 Seborrheic dermatitis, unspecified: Secondary | ICD-10-CM | POA: Diagnosis present

## 2023-02-03 DIAGNOSIS — Q631 Lobulated, fused and horseshoe kidney: Secondary | ICD-10-CM | POA: Diagnosis not present

## 2023-02-03 DIAGNOSIS — R7881 Bacteremia: Secondary | ICD-10-CM | POA: Diagnosis not present

## 2023-02-03 DIAGNOSIS — B20 Human immunodeficiency virus [HIV] disease: Secondary | ICD-10-CM | POA: Diagnosis present

## 2023-02-03 DIAGNOSIS — Z79899 Other long term (current) drug therapy: Secondary | ICD-10-CM

## 2023-02-03 DIAGNOSIS — Z801 Family history of malignant neoplasm of trachea, bronchus and lung: Secondary | ICD-10-CM

## 2023-02-03 DIAGNOSIS — Q809 Congenital ichthyosis, unspecified: Secondary | ICD-10-CM | POA: Diagnosis not present

## 2023-02-03 DIAGNOSIS — W19XXXA Unspecified fall, initial encounter: Secondary | ICD-10-CM | POA: Diagnosis present

## 2023-02-03 DIAGNOSIS — Z6820 Body mass index (BMI) 20.0-20.9, adult: Secondary | ICD-10-CM

## 2023-02-03 DIAGNOSIS — K8020 Calculus of gallbladder without cholecystitis without obstruction: Secondary | ICD-10-CM | POA: Diagnosis present

## 2023-02-03 DIAGNOSIS — I10 Essential (primary) hypertension: Secondary | ICD-10-CM | POA: Diagnosis not present

## 2023-02-03 DIAGNOSIS — F0280 Dementia in other diseases classified elsewhere without behavioral disturbance: Secondary | ICD-10-CM | POA: Diagnosis not present

## 2023-02-03 LAB — CBC WITH DIFFERENTIAL/PLATELET
Abs Immature Granulocytes: 0.07 10*3/uL (ref 0.00–0.07)
Basophils Absolute: 0 10*3/uL (ref 0.0–0.1)
Basophils Relative: 0 %
Eosinophils Absolute: 0 10*3/uL (ref 0.0–0.5)
Eosinophils Relative: 0 %
HCT: 47 % (ref 39.0–52.0)
Hemoglobin: 16.1 g/dL (ref 13.0–17.0)
Immature Granulocytes: 1 %
Lymphocytes Relative: 6 %
Lymphs Abs: 0.5 10*3/uL — ABNORMAL LOW (ref 0.7–4.0)
MCH: 35.5 pg — ABNORMAL HIGH (ref 26.0–34.0)
MCHC: 34.3 g/dL (ref 30.0–36.0)
MCV: 103.5 fL — ABNORMAL HIGH (ref 80.0–100.0)
Monocytes Absolute: 0.8 10*3/uL (ref 0.1–1.0)
Monocytes Relative: 9 %
Neutro Abs: 7.3 10*3/uL (ref 1.7–7.7)
Neutrophils Relative %: 84 %
Platelets: 58 10*3/uL — ABNORMAL LOW (ref 150–400)
RBC: 4.54 MIL/uL (ref 4.22–5.81)
RDW: 14 % (ref 11.5–15.5)
WBC: 8.7 10*3/uL (ref 4.0–10.5)
nRBC: 0 % (ref 0.0–0.2)

## 2023-02-03 LAB — COMPREHENSIVE METABOLIC PANEL
ALT: 24 U/L (ref 0–44)
AST: 85 U/L — ABNORMAL HIGH (ref 15–41)
Albumin: 2.2 g/dL — ABNORMAL LOW (ref 3.5–5.0)
Alkaline Phosphatase: 80 U/L (ref 38–126)
Anion gap: 11 (ref 5–15)
BUN: 16 mg/dL (ref 6–20)
CO2: 14 mmol/L — ABNORMAL LOW (ref 22–32)
Calcium: 8.2 mg/dL — ABNORMAL LOW (ref 8.9–10.3)
Chloride: 106 mmol/L (ref 98–111)
Creatinine, Ser: 1.45 mg/dL — ABNORMAL HIGH (ref 0.61–1.24)
GFR, Estimated: 56 mL/min — ABNORMAL LOW (ref >60)
Glucose, Bld: 108 mg/dL — ABNORMAL HIGH (ref 70–99)
Potassium: 4 mmol/L (ref 3.5–5.1)
Sodium: 131 mmol/L — ABNORMAL LOW (ref 135–145)
Total Bilirubin: 2.9 mg/dL — ABNORMAL HIGH (ref 0.0–1.2)
Total Protein: 10.6 g/dL — ABNORMAL HIGH (ref 6.5–8.1)

## 2023-02-03 LAB — RAPID URINE DRUG SCREEN, HOSP PERFORMED
Amphetamines: NOT DETECTED
Barbiturates: NOT DETECTED
Benzodiazepines: NOT DETECTED
Cocaine: NOT DETECTED
Opiates: NOT DETECTED
Tetrahydrocannabinol: NOT DETECTED

## 2023-02-03 LAB — URINALYSIS, W/ REFLEX TO CULTURE (INFECTION SUSPECTED)
Bilirubin Urine: NEGATIVE
Glucose, UA: NEGATIVE mg/dL
Ketones, ur: NEGATIVE mg/dL
Leukocytes,Ua: NEGATIVE
Nitrite: NEGATIVE
Protein, ur: 100 mg/dL — AB
Specific Gravity, Urine: >1.046 — ABNORMAL HIGH (ref 1.005–1.030)
pH: 5 (ref 5.0–8.0)

## 2023-02-03 LAB — I-STAT CG4 LACTIC ACID, ED
Lactic Acid, Venous: 5 mmol/L (ref 0.5–1.9)
Lactic Acid, Venous: 5 mmol/L (ref 0.5–1.9)
Lactic Acid, Venous: 2.7 mmol/L (ref 0.5–1.9)
Lactic Acid, Venous: 1.8 mmol/L (ref 0.5–1.9)

## 2023-02-03 LAB — TECHNOLOGIST SMEAR REVIEW: Plt Morphology: DECREASED

## 2023-02-03 LAB — HEPATITIS B SURFACE ANTIGEN: Hepatitis B Surface Ag: NONREACTIVE

## 2023-02-03 LAB — HIV ANTIBODY (ROUTINE TESTING W REFLEX): HIV Screen 4th Generation wRfx: REACTIVE — AB

## 2023-02-03 LAB — CRYPTOCOCCAL ANTIGEN: Crypto Ag: NEGATIVE

## 2023-02-03 LAB — SALICYLATE LEVEL: Salicylate Lvl: <7 mg/dL — ABNORMAL LOW (ref 7.0–30.0)

## 2023-02-03 LAB — IMMATURE PLATELET FRACTION: Immature Platelet Fraction: 11.3 % — ABNORMAL HIGH (ref 1.2–8.6)

## 2023-02-03 LAB — ETHANOL: Alcohol, Ethyl (B): <10 mg/dL (ref <10)

## 2023-02-03 LAB — BETA-HYDROXYBUTYRIC ACID: Beta-Hydroxybutyric Acid: 0.11 mmol/L (ref 0.05–0.27)

## 2023-02-03 LAB — TSH: TSH: 2.154 u[IU]/mL (ref 0.350–4.500)

## 2023-02-03 LAB — OSMOLALITY: Osmolality: 303 mosm/kg — ABNORMAL HIGH (ref 275–295)

## 2023-02-03 LAB — ACETAMINOPHEN LEVEL: Acetaminophen (Tylenol), Serum: <10 ug/mL — ABNORMAL LOW (ref 10–30)

## 2023-02-03 MED ORDER — SODIUM CHLORIDE 0.9 % IV SOLN
2.0000 g | Freq: Two times a day (BID) | INTRAVENOUS | Status: DC
  Administered 2023-02-04: 2 g via INTRAVENOUS
  Filled 2023-02-03: qty 12.5

## 2023-02-03 MED ORDER — LORAZEPAM 1 MG PO TABS: 1.0000 mg | ORAL_TABLET | ORAL | Status: DC | PRN

## 2023-02-03 MED ORDER — LACTATED RINGERS IV BOLUS
1000.0000 mL | Freq: Once | INTRAVENOUS | Status: AC
  Administered 2023-02-03: 1000 mL via INTRAVENOUS

## 2023-02-03 MED ORDER — SODIUM CHLORIDE 0.9% FLUSH
3.0000 mL | Freq: Two times a day (BID) | INTRAVENOUS | Status: DC
  Administered 2023-02-03 – 2023-02-26 (×40): 3 mL via INTRAVENOUS

## 2023-02-03 MED ORDER — FOLIC ACID 1 MG PO TABS
1.0000 mg | ORAL_TABLET | Freq: Every day | ORAL | Status: DC
  Administered 2023-02-04 – 2023-04-03 (×58): 1 mg via ORAL
  Filled 2023-02-03 (×59): qty 1

## 2023-02-03 MED ORDER — THIAMINE HCL 100 MG/ML IJ SOLN
100.0000 mg | Freq: Every day | INTRAMUSCULAR | Status: DC
  Administered 2023-02-03 – 2023-02-09 (×2): 100 mg via INTRAVENOUS
  Filled 2023-02-03 (×5): qty 2

## 2023-02-03 MED ORDER — SODIUM CHLORIDE 0.9% FLUSH: 3.0000 mL | INTRAVENOUS | Status: DC | PRN

## 2023-02-03 MED ORDER — LACTATED RINGERS IV SOLN: INTRAVENOUS | Status: AC

## 2023-02-03 MED ORDER — FENTANYL CITRATE PF 50 MCG/ML IJ SOSY: 25.0000 ug | PREFILLED_SYRINGE | Freq: Once | INTRAMUSCULAR | Status: DC

## 2023-02-03 MED ORDER — IOHEXOL 350 MG/ML SOLN
75.0000 mL | Freq: Once | INTRAVENOUS | Status: AC | PRN
  Administered 2023-02-03: 75 mL via INTRAVENOUS

## 2023-02-03 MED ORDER — LORAZEPAM 2 MG/ML IJ SOLN
1.0000 mg | Freq: Once | INTRAMUSCULAR | Status: AC | PRN
  Administered 2023-02-03: 1 mg via INTRAVENOUS
  Filled 2023-02-03: qty 1

## 2023-02-03 MED ORDER — THIAMINE MONONITRATE 100 MG PO TABS
100.0000 mg | ORAL_TABLET | Freq: Every day | ORAL | Status: DC
  Administered 2023-02-04 – 2023-02-23 (×18): 100 mg via ORAL
  Filled 2023-02-03 (×20): qty 1

## 2023-02-03 MED ORDER — VANCOMYCIN HCL 1.5 G IV SOLR
1500.0000 mg | Freq: Once | INTRAVENOUS | Status: AC
  Administered 2023-02-03: 1500 mg via INTRAVENOUS
  Filled 2023-02-03: qty 30

## 2023-02-03 MED ORDER — LORAZEPAM 2 MG/ML IJ SOLN
1.0000 mg | INTRAMUSCULAR | Status: DC | PRN
  Administered 2023-02-03 – 2023-02-04 (×3): 2 mg via INTRAVENOUS
  Administered 2023-02-04: 1 mg via INTRAVENOUS
  Filled 2023-02-03 (×4): qty 1

## 2023-02-03 MED ORDER — VANCOMYCIN HCL IN DEXTROSE 1-5 GM/200ML-% IV SOLN: 1000.0000 mg | INTRAVENOUS | Status: DC

## 2023-02-03 MED ORDER — SODIUM CHLORIDE 0.9 % IV SOLN
2.0000 g | Freq: Once | INTRAVENOUS | Status: AC
  Administered 2023-02-03: 2 g via INTRAVENOUS
  Filled 2023-02-03: qty 12.5

## 2023-02-03 MED ORDER — ADULT MULTIVITAMIN W/MINERALS CH
1.0000 | ORAL_TABLET | Freq: Every day | ORAL | Status: DC
  Administered 2023-02-04 – 2023-04-03 (×53): 1 via ORAL
  Filled 2023-02-03 (×58): qty 1

## 2023-02-03 NOTE — Progress Notes (Signed)
 EEG complete - results pending
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
## 2023-02-03 NOTE — H&P (Addendum)
 Date: 02/03/2023               Patient Name:  Russell [name] MRN: [id number]  DOB: 1965-02-07 Age / Sex: 58 y.o., male   PCP: Patient, No Pcp Per              Medical Service: Internal Medicine Teaching Service              Attending Physician: Dr. Rosan, [place] BROCKS, DO    First Contact: Damien Gin, MS3 Pager: [phone]  Second Contact: Dr. Damien Lease, DO Pager: [phone]  Third Contact Dr. Ozell Kung, MD Pager: [phone]       After Hours (After 5p/  First Contact Pager: [phone]  weekends / holidays): Second Contact Pager: [phone]   Chief Complaint: Altered Mental Status   History of Present Illness: Russell [name] is a 58 y.o. male with possible remote cocaine and alcohol use and no other known significant past medical history presenting with altered mental status.    Patient seen and evaluated while resting in bed. He was unable to provide history given his altered mental status.   Our team called patient's sister, Russell [name] and obtained the following history. Per sister, he has been acting strange for the past couple days. She said he has been staying with her on and off for 3 years as she reports she does not want him to be living on the streets. She said he starting having problem holding his stools a few days ago and has not had issues with incontinence before. She said at baseline, he watches tv, does daily house chores like taking out the trash, doesn't really go anywhere and has been having trouble with breathing. She said he was able to take out the trash yesterday and started talking erratic yesterday.   When asked, she said his last time being really normal has been a minute. This morning, she said he went to the back door and she heard a thump and then found him on the floor around 9:30 AM. She said he was coughing on and off all night the night prior. She said he coughs some at baseline. She said last week was when he was more normal (Wednesday or Thursday). On a  good day, she said he sometimes still says things that are strange. She said he doesn't like to get his feet wet in the shower. She doesn't think he has been this sick before.   Of note, she said that he has not been to a doctor recently and that he just received Medicaid.   Meds: None reported per sister No outpatient medications have been marked as taking for the 02/03/23 encounter [facility] Encounter).   Allergies: Allergies as of 02/03/2023   (No Known Allergies)   Past Medical History:  Diagnosis Date   Closed dislocation of right ankle 06/03/2018   Patient's sister reports no history of surgeries, heart disease, stroke or kidney problems. She reports no liver or lung disease or history of cancer that she knows of. No mental health disorders known per sister.   Family History: Patient's sister reports patient's uncle had lung cancer and passed away about 3 years ago.  Social History: Patient's sister reports he used to drink alcohol and use crack/cocaine. She said he has not drank heavily or used drugs recently. Sister reports he has been unemployed for the past 4 years.  Review of Systems: ROS was unable to be obtained given patient's altered mental status.  Physical Exam: Blood pressure (!) 190/113, pulse (!) 114, temperature 97.9 F (36.6 C), temperature source Oral, resp. rate 17, SpO2 97%. General: disheveled, appears uncomfortable, moving in bed, urine present on pants Cardiovascular: regular rhythm, tachycardic, no murmurs, rubs or gallops Pulmonary: normal work of breathing, diffuse crackles bilaterally Abdominal: soft, non-tender  Skin: warm, very dry skin on bilateral legs Extremities: able to move all four extremities Feet: unkept, long toe nails Neurological: alert but unable to follow commands, not oriented to person, place or time  EKG: personally reviewed my interpretation is sinus tachycardia  CXR: personally reviewed my interpretation is increased interstitial  markings  CT Chest Abdomen Pelvis  IMPRESSION: 1. Left lower lobe Pneumonia. No pleural effusion. 2. No other acute or inflammatory process identified in the chest,abdomen, or pelvis. 3. Cholelithiasis. Horseshoe kidney (normal variant). Aortic Atherosclerosis (ICD10-I70.0).  CT Head w/o Contrast  IMPRESSION: 1. No evidence of acute intracranial abnormality. 2. Mild chronic small vessel ischemic disease. 3. Moderately advanced cerebral atrophy.  Pertinent Labs CBC    Component Value Date/Time   WBC 8.7 02/03/2023 1013   RBC 4.54 02/03/2023 1013   HGB 16.1 02/03/2023 1013   HCT 47.0 02/03/2023 1013   PLT 58 (L) 02/03/2023 1013   MCV 103.5 (H) 02/03/2023 1013   MCH 35.5 (H) 02/03/2023 1013   MCHC 34.3 02/03/2023 1013   RDW 14.0 02/03/2023 1013   LYMPHSABS 0.5 (L) 02/03/2023 1013   MONOABS 0.8 02/03/2023 1013   EOSABS 0.0 02/03/2023 1013   BASOSABS 0.0 02/03/2023 1013      Latest Ref Rng & Units 02/03/2023   10:13 AM 05/25/2018    9:15 PM  CMP  Glucose 70 - 99 mg/dL 891  96   BUN 6 - 20 mg/dL 16  11   Creatinine 9.38 - 1.24 mg/dL 8.54  9.02   Sodium 864 - 145 mmol/L 131  131   Potassium 3.5 - 5.1 mmol/L 4.0  4.1   Chloride 98 - 111 mmol/L 106  99   CO2 22 - 32 mmol/L 14  19   Calcium  8.9 - 10.3 mg/dL 8.2  8.7   Total Protein 6.5 - 8.1 g/dL 89.3    Total Bilirubin 0.0 - 1.2 mg/dL 2.9    Alkaline Phos 38 - 126 U/L 80    AST 15 - 41 U/L 85    ALT 0 - 44 U/L 24      UA Hgb urine dipstick: Large Protein: 100 Spec Gravity: >1.046  Tox, Urine: Unremarkable  Acetaminophen  level: <10 Salicylate level: <7.0 Beta-hydroxybutyric acid: 0.11 TSH: 2.154  Assessment & Plan by Problem: Principal Problem:   Sepsis (HCC) Active Problems:   Fall   Encephalopathy   Community acquired pneumonia   Thrombocytopenia (HCC)  Russell [name] is a 58 y.o. male with possible remote cocaine and alcohol use and no other known significant past medical history presenting with altered  mental status and admitted for encephalopathy.    Encephalopathy  Sepsis rule out Community acquired pneumonia Patient unable to provide history information or follow simple commands; however, the team was able to talk with his sister. Differential diagnosis includes infectious, substance-induced, metabolic or electrolyte abnormalities. Patient hyponatremic but do not think this caused a seizure as patient's presentation does not appear to be post-ictal. Patient is not hypoglycemic on exam, so metabolic less likely. High suspicion for infectious etiology given patient's pneumonia revealed on CT chest. High index of suspicion for meningitis and encephalitis. Legionella and syphilis also considered.  Patient's sister does not think he drank much alcohol or used drugs recently, so substance-induced etiology less likely. Alcohol levels low on admission; however alcohol withdrawal is possible. Urine tox screen negative. Isopropyl alcohol poisoning on the differential as patient does not have an anion gap. Will obtain additional toxic, infectious and metabolic labs as seen below.   Plan: - LR bolus - IV cefepime  - IV Vancomycin  - Legionella urinary antigen - Step pneumoniae urinary antigen - Ammonia level - Acetone, ethanol, isopropyl and methanol labs - EEG - CIWA monitoring with Ativan  - High-dose thiamine  - Folate - Multivitamin - Blood cultures pending - Immature platelet fraction - HIV  - RPR - Osmolality  Thrombocytopenia Patient's platelets 58. Suspect thrombocytopenia is secondary to sepsis. Will continue to trend CBC.   Code: Full VTE Prophylaxis: None Diet: NPO Fluids: LR bolus  Dispo: Admit patient to Inpatient with expected length of stay greater than 2 midnights.  Signed: Saunders Damien MATSU, Medical Student 02/03/2023, 4:57 PM  Pager: [phone]  Attestation for Student Documentation:  I personally was present and performed or re-performed the history, physical exam and  medical decision-making activities of this service and have verified that the service and findings are accurately documented in the student's note.  58 year old with remote history of cocaine use disorder and housing instability presents from his sister's house, where he is currently living, with deteriorating mental status over the course of last 4 to 5 days, admitted for encephalopathy and possible sepsis.  Encephalopathy Really dense delirium at present, unable to attend to conversation.  Does not answer questions appropriately.  Very agitated.  Suspect this is due to septic encephalopathy, but I think a withdrawal syndrome is also on the differential.  Of note, when we reassessed him after empiric treatment for alcohol withdrawal, he was more comfortable, not tachypneic, heart rate was better.  I think lower on the differential is a meningitis or encephalitis as there is really a paucity of infectious signs in this person.  Will continue treating infection, simultaneously with empiric treatment for alcohol withdrawal symptom via symptom triggered Ativan .  Will also test for volatile alcohols and acetaminophen  and salicylate.  Sepsis Infectious source is pneumonia as seen on CT.  He was tachycardic, tachypneic, and delirious on admission.  Vancomycin  and cefepime  were started in the emergency department, which we will continue at least until his clinical status improves.  Admit to progressive floor with cardiac monitoring.  Community-acquired pneumonia Report of cough and the imaging findings support this.  He is also noted to have pretty coarse breath sounds on exam.  No fever or leukocytosis.  Treat with broad-spectrum antibiotics for now, per above.  The consolidation on the chest CT is somewhat masslike.  I think it would be worthwhile to follow-up after he is better to ensure there is no underlying mass.  Thrombocytopenia May be due to sepsis.  Will get an immature platelet fraction and blood  smear to evaluate for this.  Doubt DIC or other consumptive/destructive platelet process.  Fall The presenting concern, did not appear to be injured from this.  Was essentially found down by his sister in his bedroom but think this is related to encephalopathy.  Ozell Kung MD 02/03/2023, 6:21 PM

## 2023-02-03 NOTE — Procedures (Signed)
 Patient Name: Russell [name]  MRN: [id number]  Epilepsy Attending: Arlin MALVA Krebs  Referring Physician/Provider: Norrine Sharper, MD  Date: 02/03/2023 Duration: 23.21 mins  Patient history: 58yo M with ams getting eeg to evaluate for seizure  Level of alertness: Awake  AEDs during EEG study: Ativan   Technical aspects: This EEG study was done with scalp electrodes positioned according to the 10-20 International system of electrode placement. Electrical activity was reviewed with band pass filter of 1-70Hz , sensitivity of 7 uV/mm, display speed of 22mm/sec with a 60Hz  notched filter applied as appropriate. EEG data were recorded continuously and digitally stored.  Video monitoring was available and reviewed as appropriate.  Description: The posterior dominant rhythm consists of 7.5 Hz activity of moderate voltage (25-35 uV) seen predominantly in posterior head regions, symmetric and reactive to eye opening and eye closing. EEG showed continuous generalized 5 to 7 Hz theta slowing. Hyperventilation and photic stimulation were not performed.     ABNORMALITY - Continuous slow, generalized  IMPRESSION: This study is suggestive of moderate diffuse encephalopathy. No seizures or epileptiform discharges were seen throughout the recording.  Russell [name]

## 2023-02-03 NOTE — Progress Notes (Signed)
 ED Pharmacy Antibiotic Sign Off An antibiotic consult was received from an ED provider for vancomycin  and cefepime  per pharmacy dosing for sepsis. A chart review was completed to assess appropriateness.   The following one time order(s) were placed:  Vancomycin  1500 mg IV x 1 Cefepime  2g IV x 1  Further antibiotic and/or antibiotic pharmacy consults should be ordered by the admitting provider if indicated.   Thank you for allowing pharmacy to be a part of this patient's care.   Dorn Poot, [facility]  Clinical Pharmacist 02/03/23 10:42 AM

## 2023-02-03 NOTE — Progress Notes (Signed)
 Pharmacy Antibiotic Note  Russell [name] is a 58 y.o. male admitted on 02/03/2023 with sepsis, possibly 2/2 pneumonia.  Pharmacy has been consulted for vancomycin  and cefepime  dosing. Received 2g cefepime  and 1500mg  vancomycin  ~1200 today.  Plan: Vancomycin  1000mg  IV every 24 hours (eAUC 528.3, Scr 1.45). Goal AUC 400-550. Obtain vanc levels as indicated Start cefepime  2g IV Q12H F/u renal function, clinical progression, cultures, and MRSA swab  Temp (24hrs), Avg:98.4 F (36.9 C), Min:97.9 F (36.6 C), Max:98.9 F (37.2 C)  Recent Labs  Lab 02/03/23 1013 02/03/23 1026 02/03/23 1514  WBC 8.7  --   --   CREATININE 1.45*  --   --   LATICACIDVEN  --  5.0* 5.0*    CrCl cannot be calculated (Unknown ideal weight.).    No Known Allergies  Thank you for allowing pharmacy to be a part of this patient's care.  Harlene VEAR Mandril 02/03/2023 6:13 PM

## 2023-02-03 NOTE — ED Notes (Signed)
Got patient into a gown on the monitor patient is resting with call bell in reach and family at bedside ?

## 2023-02-03 NOTE — Hospital Course (Addendum)
 Patient presented to the ED via EMS after sister found him after a fall on the ground with altered mental status. Blood cultures grew streptococcus pneumoniae, RPR reactive with 1:2 titer. T. Pallidum antibody resulted negative. HIV Screen 4th Generation reactive with HIV 1 Ab reactive. CD4 count of 116. Patient transitioned to ceftriaxone , biktarvy  and bactrim . Patient's mentation improved. Despite resolution of his severe sepsis, the patient remained confused, alert to only self and place. This is thought to be likely related to his chronic untreated HIV. Psychiatry assessed him and he does not have capacity to make his own decisions. Attempted to leave AMA and was agitated on 1/15. Repeat blood cultures, CXR and respiratory panel were negative. Pt was IVC'ed due to encephalopathy and risk to self with reduced safety awareness. He requires assistance with all ADLs and IDLs.  Started on Zyprexa  and has remained easily redirectable since. IVC was discontinued.   HIV/AIDS Last CD4 count 03/19/2023 - 167. Will continue Biktarvy  and continue bactrim  for PJP prophylaxis. Will need to follow with ID. Scheduled to see Dr. Efrain on 04/22/2023 @ 10:45am.    Reduced right ventricular systolic fxn Pulmonary HTN Per cardiology pulmonary hypertension/RV dysfunction-most likely etiology is HIV with possible contribution from history of substance abuse. Workup including PE study, V/Q scan, and autoimmune workup was negative. Per cardiology - not necessary to follow up, not a candidate for cath at this time.   HCV Early Cirrhosis  Thrombocytopenia Patient is positive for hepatitis C with a viral load of 19,300,000 and imaging showing early cirrhosis. Patient will require HCC monitoring and outpatient follow up with ID. Thrombocytopenia was stable during hospitalization.  Cognitive impairment c/f HIV Dementia Significant cognitive impairment and limited orientation. He has been pleasant and redirectable following  acute stabilization of his medical commodities and  initation of antipsychotic medications. ALF placement was perused, but unsuccessfully due to insurance / logistical reasons. Recommended to continue Zyprexa  5 mg BID with additional 10 mg at bedtime. Social work as has up patient with ACTT team and resources to assist patient in community.   Anemia of Chronic Disease Hemoglobin stable around 12.  Aortic atherosclerosis  HLD Finding found on CT A/P on admission. Lipid panel unremarkable other than low HDL of 14. Crestor  5 mg started. Recommend outpatient follow-up.  HTN Well controlled on amlodipine  5 mg.   Cholelithiasis without cholecystitis  Horseshoe kidney Incidental finding found on CT A/P on admission.

## 2023-02-03 NOTE — ED Notes (Signed)
 Pt sister Revonda Standard requesting update on POC attending MD notified.   Revonda Standard [phone]

## 2023-02-03 NOTE — ED Notes (Signed)
 EEG at bedside.

## 2023-02-03 NOTE — ED Provider Notes (Signed)
 [place] EMERGENCY DEPARTMENT AT [facility] Provider Note  CSN: [id number] Arrival date & time: 02/03/23 9044  Chief Complaint(s) No chief complaint on file.  HPI Russell [name] is a 58 y.o. male with no significant past medical history who presents emergency room for evaluation of altered mental status.  History obtained from patient's sister who states that over the last 72 hours patient's behavior has changed.  He has become more erratic, asking about family members who are deceased, urinating and defecating on himself.  States that he does have a previous history of cocaine use but has not used in a very long time.  On arrival, patient is encephalopathic attempting to pull cardiac leads off but will answer yes and no to questions.  Additional history unable to obtain in the setting of patient's current altered mental status.  Patient arrives tachycardic and hypertensive.   Past Medical History Past Medical History:  Diagnosis Date   Closed dislocation of right ankle 06/03/2018   Patient Active Problem List   Diagnosis Date Noted   Closed dislocation of right ankle 06/03/2018   Home Medication(s) Prior to Admission medications   Medication Sig Start Date End Date Taking? Authorizing Provider  acetaminophen  (TYLENOL ) 500 MG tablet Take 1,000 mg by mouth every 6 (six) hours as needed.    [provider]  naproxen  (NAPROSYN ) 500 MG tablet Take 1 tablet (500 mg total) by mouth 2 (two) times daily with a meal. 12/04/19   Christopher Savannah, PA-C  oxyCODONE -acetaminophen  (PERCOCET) 5-325 MG tablet Take 1 tablet by mouth every 4 (four) hours as needed. 05/26/18   Jarold Olam HERO, PA-C  valACYclovir  (VALTREX ) 1000 MG tablet Take 1 tablet (1,000 mg total) by mouth 3 (three) times daily. 12/04/19   Christopher Savannah, PA-C                                                                                                                                    Past Surgical History Past Surgical  History:  Procedure Laterality Date   ORIF ANKLE FRACTURE Right 06/03/2018   Procedure: OPEN REDUCTION INTERNAL FIXATION (ORIF) TRIMALLEOLAR ANKLE FRACTURE WITH SYNDESMOSIS;  Surgeon: Josefina Chew, MD;  Location: [facility];  Service: Orthopedics;  Laterality: Right;   Family History History reviewed. No pertinent family history.  Social History Social History   Tobacco Use   Smoking status: Some Days   Smokeless tobacco: Never  Vaping Use   Vaping status: Never Used  Substance Use Topics   Alcohol use: Yes    Comment: 6 pack a day   Drug use: Yes    Types: Marijuana    Comment: daily   Allergies Patient has no known allergies.  Review of Systems Review of Systems  Unable to perform ROS: Mental status change    Physical Exam Vital Signs  I have reviewed the triage vital signs BP (!) 182/122 (BP Location: Left  Arm)   Pulse (!) 121   Temp 98.9 F (37.2 C) (Axillary)   Resp 16   SpO2 97%   Physical Exam Constitutional:      General: He is not in acute distress.    Appearance: Normal appearance.  HENT:     Head: Normocephalic and atraumatic.     Nose: No congestion or rhinorrhea.  Eyes:     General:        Right eye: No discharge.        Left eye: No discharge.     Extraocular Movements: Extraocular movements intact.     Pupils: Pupils are equal, round, and reactive to light.  Cardiovascular:     Rate and Rhythm: Regular rhythm. Tachycardia present.     Heart sounds: No murmur heard. Pulmonary:     Effort: No respiratory distress.     Breath sounds: No wheezing or rales.  Abdominal:     General: There is no distension.     Tenderness: There is no abdominal tenderness.  Musculoskeletal:        General: Normal range of motion.     Cervical back: Normal range of motion.  Skin:    General: Skin is warm and dry.  Neurological:     General: No focal deficit present.     Mental Status: He is alert. He is disoriented.     ED Results and  Treatments Labs (all labs ordered are listed, but only abnormal results are displayed) Labs Reviewed  I-STAT CG4 LACTIC ACID, ED - Abnormal; Notable for the following components:      Result Value   Lactic Acid, Venous 5.0 (*)    All other components within normal limits  CULTURE, BLOOD (ROUTINE X 2)  CULTURE, BLOOD (ROUTINE X 2)  COMPREHENSIVE METABOLIC PANEL  CBC WITH DIFFERENTIAL/PLATELET  URINALYSIS, W/ REFLEX TO CULTURE (INFECTION SUSPECTED)  RAPID URINE DRUG SCREEN, HOSP PERFORMED  ETHANOL                                                                                                                          Radiology No results found.  Pertinent labs & imaging results that were available during my care of the patient were reviewed by me and considered in my medical decision making (see MDM for details).  Medications Ordered in ED Medications  lactated ringers  bolus 1,000 mL (has no administration in time range)  LORazepam  (ATIVAN ) injection 1 mg (has no administration in time range)  Procedures .Critical Care  Performed by: Albertina Dixon, MD Authorized by: Albertina Dixon, MD   Critical care provider statement:    Critical care time (minutes):  30   Critical care was necessary to treat or prevent imminent or life-threatening deterioration of the following conditions:  Sepsis   Critical care was time spent personally by me on the following activities:  Development of treatment plan with patient or surrogate, discussions with consultants, evaluation of patient's response to treatment, examination of patient, ordering and review of laboratory studies, ordering and review of radiographic studies, ordering and performing treatments and interventions, pulse oximetry, re-evaluation of patient's condition and review of old charts   (including  critical care time)  Medical Decision Making / ED Course   This patient presents to the ED for concern of altered mental status, this involves an extensive number of treatment options, and is a complaint that carries with it a high risk of complications and morbidity.  The differential diagnosis includes infection, metabolic/toxic encephalopathy, hypoglycemia, malperfusion, hypoxia, trauma or other intracranial process  MDM: Patient seen emerged part for evaluation of altered mental status.  Physical exam reveals a tachycardia and disoriented patient but is otherwise unremarkable.  Laboratory evaluation with a significant lactate elevation to 5.0 and fluid resuscitation begun.,  Creatinine 1.54, albumin  2.2, total bili 2.9.  Blood cultures obtained and broad-spectrum antibiotics initiated as patient does meet SIRS criteria.  Imaging including CT head, CT chest abdomen pelvis showing left lower lobe pneumonia.  Patient will require hospital admission for altered mental status, elevated lactate in the setting of a new pneumonia.   Additional history obtained: -Additional history obtained from sister -External records from outside source obtained and reviewed including: Chart review including previous notes, labs, imaging, consultation notes   Lab Tests: -I ordered, reviewed, and interpreted labs.   The pertinent results include:   Labs Reviewed  I-STAT CG4 LACTIC ACID, ED - Abnormal; Notable for the following components:      Result Value   Lactic Acid, Venous 5.0 (*)    All other components within normal limits  CULTURE, BLOOD (ROUTINE X 2)  CULTURE, BLOOD (ROUTINE X 2)  COMPREHENSIVE METABOLIC PANEL  CBC WITH DIFFERENTIAL/PLATELET  URINALYSIS, W/ REFLEX TO CULTURE (INFECTION SUSPECTED)  RAPID URINE DRUG SCREEN, HOSP PERFORMED  ETHANOL      EKG   EKG Interpretation Date/Time:  Tuesday February 03 2023 10:26:03 EST Ventricular Rate:  118 PR Interval:  176 QRS Duration:  104 QT  Interval:  324 QTC Calculation: 454 R Axis:   115  Text Interpretation: Sinus tachycardia Possible Left atrial enlargement Right axis deviation Pulmonary disease pattern Abnormal ECG When compared with ECG of 03-Feb-2023 10:25, PREVIOUS ECG IS PRESENT Confirmed by Aletha Allebach ([id number]) on 02/03/2023 10:35:55 AM         Imaging Studies ordered: I ordered imaging studies including chest x-ray, CT head, CT chest abdomen pelvis I independently visualized and interpreted imaging. I agree with the radiologist interpretation   Medicines ordered and prescription drug management: Meds ordered this encounter  Medications   lactated ringers  bolus 1,000 mL   LORazepam  (ATIVAN ) injection 1 mg    -I have reviewed the patients home medicines and have made adjustments as needed  Critical interventions Fluids, antibiotics   Cardiac Monitoring: The patient was maintained on a cardiac monitor.  I personally viewed and interpreted the cardiac monitored which showed an underlying rhythm of: Sinus tachycardia  Social Determinants of Health:  Factors impacting patients care include:  Distant history of cocaine use   Reevaluation: After the interventions noted above, I reevaluated the patient and found that they have :improved  Co morbidities that complicate the patient evaluation  Past Medical History:  Diagnosis Date   Closed dislocation of right ankle 06/03/2018      Dispostion: I considered admission for this patient, and patient require hospital admission for pneumonia and concern for bacteremia with altered mental status     Final Clinical Impression(s) / ED Diagnoses Final diagnoses:  None     @PCDICTATION @    Albertina Dixon, MD 02/03/23 1728

## 2023-02-04 ENCOUNTER — Encounter (HOSPITAL_COMMUNITY): Payer: Self-pay | Admitting: Internal Medicine

## 2023-02-04 ENCOUNTER — Telehealth (HOSPITAL_COMMUNITY): Payer: Self-pay | Admitting: Pharmacy Technician

## 2023-02-04 ENCOUNTER — Other Ambulatory Visit (HOSPITAL_COMMUNITY): Payer: Self-pay

## 2023-02-04 DIAGNOSIS — B953 Streptococcus pneumoniae as the cause of diseases classified elsewhere: Secondary | ICD-10-CM

## 2023-02-04 DIAGNOSIS — J13 Pneumonia due to Streptococcus pneumoniae: Secondary | ICD-10-CM

## 2023-02-04 DIAGNOSIS — B182 Chronic viral hepatitis C: Secondary | ICD-10-CM

## 2023-02-04 DIAGNOSIS — F1020 Alcohol dependence, uncomplicated: Secondary | ICD-10-CM | POA: Diagnosis not present

## 2023-02-04 DIAGNOSIS — B20 Human immunodeficiency virus [HIV] disease: Secondary | ICD-10-CM | POA: Diagnosis not present

## 2023-02-04 DIAGNOSIS — Z5900 Homelessness unspecified: Secondary | ICD-10-CM

## 2023-02-04 DIAGNOSIS — A403 Sepsis due to Streptococcus pneumoniae: Secondary | ICD-10-CM | POA: Diagnosis not present

## 2023-02-04 DIAGNOSIS — Q631 Lobulated, fused and horseshoe kidney: Secondary | ICD-10-CM

## 2023-02-04 DIAGNOSIS — I700 Atherosclerosis of aorta: Secondary | ICD-10-CM | POA: Diagnosis present

## 2023-02-04 DIAGNOSIS — K8020 Calculus of gallbladder without cholecystitis without obstruction: Secondary | ICD-10-CM

## 2023-02-04 DIAGNOSIS — F17210 Nicotine dependence, cigarettes, uncomplicated: Secondary | ICD-10-CM

## 2023-02-04 DIAGNOSIS — R7881 Bacteremia: Secondary | ICD-10-CM | POA: Diagnosis present

## 2023-02-04 DIAGNOSIS — A530 Latent syphilis, unspecified as early or late: Secondary | ICD-10-CM | POA: Diagnosis present

## 2023-02-04 DIAGNOSIS — G319 Degenerative disease of nervous system, unspecified: Secondary | ICD-10-CM

## 2023-02-04 HISTORY — DX: Lobulated, fused and horseshoe kidney: Q63.1

## 2023-02-04 HISTORY — DX: Streptococcus pneumoniae as the cause of diseases classified elsewhere: B95.3

## 2023-02-04 HISTORY — DX: Calculus of gallbladder without cholecystitis without obstruction: K80.20

## 2023-02-04 HISTORY — DX: Atherosclerosis of aorta: I70.0

## 2023-02-04 HISTORY — DX: Degenerative disease of nervous system, unspecified: G31.9

## 2023-02-04 LAB — VOLATILES,BLD-ACETONE,ETHANOL,ISOPROP,METHANOL
Acetone, blood: <0.01 g/dL (ref 0.000–0.010)
Ethanol, blood: <0.01 g/dL (ref 0.000–0.010)
Isopropanol, blood: <0.01 g/dL (ref 0.000–0.010)
Methanol, blood: <0.01 g/dL (ref 0.000–0.010)

## 2023-02-04 LAB — TSH: TSH: 4.14 u[IU]/mL (ref 0.350–4.500)

## 2023-02-04 LAB — BLOOD CULTURE ID PANEL (REFLEXED) - BCID2

## 2023-02-04 LAB — CBC
HCT: 38.2 % — ABNORMAL LOW (ref 39.0–52.0)
Hemoglobin: 13.1 g/dL (ref 13.0–17.0)
MCH: 33.9 pg (ref 26.0–34.0)
MCHC: 34.3 g/dL (ref 30.0–36.0)
MCV: 99 fL (ref 80.0–100.0)
Platelets: 46 10*3/uL — ABNORMAL LOW (ref 150–400)
RBC: 3.86 MIL/uL — ABNORMAL LOW (ref 4.22–5.81)
RDW: 13.7 % (ref 11.5–15.5)
WBC: 8.9 10*3/uL (ref 4.0–10.5)
nRBC: 0 % (ref 0.0–0.2)

## 2023-02-04 LAB — BASIC METABOLIC PANEL
Anion gap: 7 (ref 5–15)
BUN: 18 mg/dL (ref 6–20)
CO2: 18 mmol/L — ABNORMAL LOW (ref 22–32)
Calcium: 7.6 mg/dL — ABNORMAL LOW (ref 8.9–10.3)
Chloride: 108 mmol/L (ref 98–111)
Creatinine, Ser: 1.38 mg/dL — ABNORMAL HIGH (ref 0.61–1.24)
GFR, Estimated: 59 mL/min — ABNORMAL LOW (ref >60)
Glucose, Bld: 94 mg/dL (ref 70–99)
Potassium: 3.7 mmol/L (ref 3.5–5.1)
Sodium: 133 mmol/L — ABNORMAL LOW (ref 135–145)

## 2023-02-04 LAB — CD4/CD8 (T-HELPER/T-SUPPRESSOR CELL)
CD4 absolute: 116 /uL — ABNORMAL LOW (ref 400–1790)
CD4%: 11.84 % — ABNORMAL LOW (ref 33–65)
CD8 T Cell Abs: 702 /uL (ref 190–1000)
CD8tox: 71.63 % — ABNORMAL HIGH (ref 12–40)
Ratio: 0.17 — ABNORMAL LOW (ref 1.0–3.0)
Total lymphocyte count: 979 /uL — ABNORMAL LOW (ref 1000–4000)

## 2023-02-04 LAB — RPR
RPR Ser Ql: REACTIVE — AB
RPR Titer: 1:2 {titer}

## 2023-02-04 LAB — STREP PNEUMONIAE URINARY ANTIGEN: Strep Pneumo Urinary Antigen: NEGATIVE

## 2023-02-04 LAB — HEPATITIS A ANTIBODY, TOTAL: hep A Total Ab: NONREACTIVE

## 2023-02-04 LAB — AMMONIA: Ammonia: 36 umol/L — ABNORMAL HIGH (ref 9–35)

## 2023-02-04 MED ORDER — BICTEGRAVIR-EMTRICITAB-TENOFOV 50-200-25 MG PO TABS
1.0000 | ORAL_TABLET | Freq: Every day | ORAL | Status: DC
  Administered 2023-02-04 – 2023-04-03 (×58): 1 via ORAL
  Filled 2023-02-04 (×59): qty 1

## 2023-02-04 MED ORDER — SODIUM CHLORIDE 0.9 % IV SOLN
2.0000 g | INTRAVENOUS | Status: DC
  Administered 2023-02-04 – 2023-02-06 (×3): 2 g via INTRAVENOUS
  Filled 2023-02-04 (×3): qty 20

## 2023-02-04 NOTE — Discharge Instructions (Addendum)

## 2023-02-04 NOTE — Evaluation (Signed)
 Occupational Therapy Evaluation Patient Details Name: Russell [name] MRN: [id number] DOB: [birth year] Today's Date: 02/04/2023   History of Present Illness Pt is a 58 yo male who presented to [facility] ED on 02/03/23 with a chief complaint of fall and AMS, being worked up for sepsis possibly due to pneumonia. PMH remote cocaine use and alcohol use.   Clinical Impression   Pt admitted for above, does get agitated easily especially with questions. Pt demonstrating very poor ability to sequence and limited initiation in ADLs, currently needing Max A for all ADLs except feeding. Pt also required Max A to get to EOB, not able to stand due to pt not processing well enough to safely attempted, even attempted to facilitate the movement but it was not registering for pt despite repetitive discussions on effort to stand. OT to continue following pt acutely to address deficits and help transition to next level of care. Patient would benefit from post acute skilled rehab facility with <3 hours of therapy and 24/7 support       If plan is discharge home, recommend the following: Supervision due to cognitive status;Direct supervision/assist for financial management;Direct supervision/assist for medications management;A lot of help with walking and/or transfers;A lot of help with bathing/dressing/bathroom;Assistance with cooking/housework    Functional Status Assessment  Patient has had a recent decline in their functional status and demonstrates the ability to make significant improvements in function in a reasonable and predictable amount of time.  Equipment Recommendations  Other (comment) (TBD with further assesement)    Recommendations for Other Services       Precautions / Restrictions Precautions Precautions: Fall Restrictions Weight Bearing Restrictions Per Provider Order: No      Mobility Bed Mobility Overal bed mobility: Needs Assistance Bed Mobility: Supine to Sit, Sit to Supine     Supine to  sit: Max assist Sit to supine: Max assist   General bed mobility comments: minimal initiation from pt, not sequencing tasks. Places himself in long sitting but not following command to get to EOB    Transfers                   General transfer comment: Pt not initiating any effort to stand, tried to give pt multiple cues but none of them clicking. deferred stand from stretcher as it would simply involve forceful jerking of pt into standing and could likely just agitate him if he is unaware of what is going on      Balance Overall balance assessment: Needs assistance Sitting-balance support: Feet unsupported, No upper extremity supported Sitting balance-Leahy Scale: Fair                                     ADL either performed or assessed with clinical judgement   ADL                                         General ADL Comments: Pt very limited on eval, he demonstrates some capacity to initiate ADLs but due to impaired processing and sequencing he needs Max A for ADLs (including UBD). Pt fixated on removing crumbs from EOB     Vision         Perception         Praxis  Pertinent Vitals/Pain Pain Assessment Pain Assessment: 0-10 Pain Score: 4  Pain Location: BLEs and BUEs with movement to EOB Pain Descriptors / Indicators: Moaning, Grimacing Pain Intervention(s): Limited activity within patient's tolerance, Monitored during session     Extremity/Trunk Assessment Upper Extremity Assessment Upper Extremity Assessment: Generalized weakness;Difficult to assess due to impaired cognition   Lower Extremity Assessment Lower Extremity Assessment: Defer to PT evaluation       Communication Communication Communication: No apparent difficulties Cueing Techniques: Verbal cues;Tactile cues   Cognition Arousal: Alert Behavior During Therapy: Agitated, Flat affect Overall Cognitive Status: Impaired/Different from baseline Area  of Impairment: Following commands, Memory, Safety/judgement, Awareness, Problem solving                     Memory: Decreased short-term memory Following Commands: Follows one step commands inconsistently, Follows one step commands with increased time Safety/Judgement: Decreased awareness of safety, Decreased awareness of deficits Awareness: Intellectual Problem Solving: Difficulty sequencing, Slow processing, Decreased initiation, Requires verbal cues, Requires tactile cues General Comments: Pt reports he just wants to get out of here, agitated with questions     General Comments  VSs per monitor    Exercises     Shoulder Instructions      Home Living Family/patient expects to be discharged to:: Private residence Living Arrangements: Other relatives;Children (sister) Available Help at Discharge: Family;Available PRN/intermittently Type of Home: House                           Additional Comments: pt confused, reports he is tired of all of the questions. pt getting increasingly agitated with further questions regarding home seutp      Prior Functioning/Environment               Mobility Comments: Pt reports he does not know          OT Problem List: Decreased strength;Decreased cognition      OT Treatment/Interventions: Self-care/ADL training;Therapeutic exercise;Therapeutic activities;Patient/family education;Cognitive remediation/compensation;DME and/or AE instruction;Balance training    OT Goals(Current goals can be found in the care plan section) Acute Rehab OT Goals OT Goal Formulation: Patient unable to participate in goal setting Time For Goal Achievement: 02/18/23 Potential to Achieve Goals: Fair ADL Goals Pt Will Perform Grooming: sitting;with set-up Pt Will Perform Upper Body Dressing: sitting;with supervision Pt Will Perform Lower Body Dressing: with supervision;sit to/from stand Pt Will Transfer to Toilet: bedside commode;with  contact guard assist;stand pivot transfer Additional ADL Goal #1: Pt will follow 90% of verbal commands during functional activity to demonstrate improving cognition  OT Frequency: Min 1X/week    Co-evaluation              AM-PAC OT 6 Clicks Daily Activity     Outcome Measure Help from another person eating meals?: None Help from another person taking care of personal grooming?: A Lot Help from another person toileting, which includes using toliet, bedpan, or urinal?: A Lot Help from another person bathing (including washing, rinsing, drying)?: A Lot Help from another person to put on and taking off regular upper body clothing?: A Lot Help from another person to put on and taking off regular lower body clothing?: A Lot 6 Click Score: 14   End of Session Equipment Utilized During Treatment: Gait belt Nurse Communication: Mobility status (not following commands to get OOB, bed pan rec)  Activity Tolerance: Other (comment) (limited by poor cognition) Patient left: in bed;with call bell/phone within reach;with  bed alarm set  OT Visit Diagnosis: Other symptoms and signs involving cognitive function                Time: 1118-1140 OT Time Calculation (min): 22 min Charges:  OT General Charges $OT Visit: 1 Visit OT Evaluation $OT Eval Low Complexity: 1 Low  02/04/2023  AB, OTR/L  Acute Rehabilitation Services  Office: [phone]   Curtistine JONETTA Das 02/04/2023, 1:21 PM

## 2023-02-04 NOTE — Plan of Care (Signed)

## 2023-02-04 NOTE — Evaluation (Signed)
 Clinical/Bedside Swallow Evaluation Patient Details  Name: Russell [name] MRN: [id number] Date of Birth: [birth year]  Today's Date: 02/04/2023 Time: SLP Start Time (ACUTE ONLY): 0855 SLP Stop Time (ACUTE ONLY): 0908 SLP Time Calculation (min) (ACUTE ONLY): 13 min  Past Medical History:  Past Medical History:  Diagnosis Date   Closed dislocation of right ankle 06/03/2018   Past Surgical History:  Past Surgical History:  Procedure Laterality Date   ORIF ANKLE FRACTURE Right 06/03/2018   Procedure: OPEN REDUCTION INTERNAL FIXATION (ORIF) TRIMALLEOLAR ANKLE FRACTURE WITH SYNDESMOSIS;  Surgeon: Josefina Chew, MD;  Location: [facility];  Service: Orthopedics;  Laterality: Right;   HPI:  Russell [name] is a 58 y.o. male who presented to ED following a fall at home with altered mental status. Pt with possible remote cocaine and alcohol use and no other known significant past medical history.    Assessment / Plan / Recommendation  Clinical Impression  Pt presents with functional swallowing as assessed clincally. Pt tolerated all consistencies trialed without any clinical s/s of aspiration, including serial straw sips of thin liquid.  Oral clearance of solids seemingly adequate based on limited visualization of oral cavity.  Pt is seemingly very confused and did not follow directions.  CXR is concerning for LLL pna.    Pt would likely not participate in MBS at this time given how difficult it was to get him to accept PO trials today.  Recommend SLP follow for tolerance.   Formal cognitive-linguistic assessment to follow.  Pt appears to have deficits with attention and orientation.  Baseline level of function unknown.  SLP Visit Diagnosis: Dysphagia, unspecified (R13.10)    Aspiration Risk  No limitations    Diet Recommendation Regular;Thin liquid    Liquid Administration via: Cup;Straw Medication Administration: Whole meds with liquid (as tolerated) Compensations: Slow rate;Small  sips/bites Postural Changes: Seated upright at 90 degrees    Other  Recommendations Oral Care Recommendations: Oral care BID    Recommendations for follow up therapy are one component of a multi-disciplinary discharge planning process, led by the attending physician.  Recommendations may be updated based on patient status, additional functional criteria and insurance authorization.  Follow up Recommendations No SLP follow up      Assistance Recommended at Discharge  N/A  Functional Status Assessment Patient has had a recent decline in their functional status and demonstrates the ability to make significant improvements in function in a reasonable and predictable amount of time.  Frequency and Duration min 2x/week  2 weeks       Prognosis Prognosis for improved oropharyngeal function: Good      Swallow Study   General Date of Onset: 02/03/23 HPI: Russell [name] is a 58 y.o. male who presented to ED following a fall at home with altered mental status. Pt with possible remote cocaine and alcohol use and no other known significant past medical history. Type of Study: Bedside Swallow Evaluation Previous Swallow Assessment: none Diet Prior to this Study: NPO Temperature Spikes Noted: No History of Recent Intubation: No Behavior/Cognition: Alert;Requires cueing;Doesn't follow directions;Distractible;Confused Oral Cavity Assessment:  (Unabl to assess) Oral Care Completed by SLP: No Oral Cavity - Dentition: Adequate natural dentition Vision: Functional for self-feeding Self-Feeding Abilities: Able to feed self Patient Positioning: Upright in bed Baseline Vocal Quality: Normal Volitional Cough: Cognitively unable to elicit Volitional Swallow: Unable to elicit    Oral/Motor/Sensory Function Overall Oral Motor/Sensory Function:  (Unable to assess)   Ice Chips Ice chips: Not tested  Thin Liquid Thin Liquid: Within functional limits Presentation: Straw    Nectar Thick Nectar Thick  Liquid: Not tested   Honey Thick Honey Thick Liquid: Not tested   Puree Puree: Within functional limits Presentation: Spoon   Solid     Solid: Within functional limits Presentation: Self Fed (SLP fed)      Anette FORBES Grippe, MA, CCC-SLP Acute Rehabilitation Services Office: [phone] 02/04/2023,9:21 AM

## 2023-02-04 NOTE — Progress Notes (Signed)
 CSW added substance abuse resources to patient's AVS.  Edwin Dada, MSW, LCSW Transitions of Care  Clinical Social Worker II [phone]

## 2023-02-04 NOTE — ED Notes (Signed)
 ED TO INPATIENT HANDOFF REPORT  ED Nurse Name and Phone #: [phone] Everson Mott RN   S Name/Age/Gender Russell [name] 58 y.o. male Room/Bed: 045C/045C  Code Status   Code Status: Full Code  Home/SNF/Other Home Patient oriented to: self, place, time, and situation Is this baseline? Yes   Triage Complete: Triage complete  Chief Complaint Sepsis [facility]) [A41.9]  Triage Note Patient arrived by [facility] from home after his sister called for fall. Patient reportedly not himself x 2-3 days. Doesn't take daily meds. Patient will acknowledge to name and state name but confused to time and place. Denies pain   Allergies No Known Allergies  Level of Care/Admitting Diagnosis ED Disposition     ED Disposition  Admit   Condition  --   Comment  Hospital Area: [facility] [[id number]]  Level of Care: Progressive [102]  Admit to Progressive based on following criteria: MULTISYSTEM THREATS such as stable sepsis, metabolic/electrolyte imbalance with or without encephalopathy that is responding to early treatment.  May admit patient to [facility] or [facility] if equivalent level of care is available:: No  Covid Evaluation: Asymptomatic - no recent exposure (last 10 days) testing not required  Diagnosis: Sepsis [facility]) [[id number]]  Admitting Physician: ROSAN DAYTON JAYSON LORINE  Attending Physician: ROSAN DAYTON JAYSON [[id number]]  Certification:: I certify this patient will need inpatient services for at least 2 midnights  Expected Medical Readiness: 02/06/2023          B Medical/Surgery History Past Medical History:  Diagnosis Date   Closed dislocation of right ankle 06/03/2018   Past Surgical History:  Procedure Laterality Date   ORIF ANKLE FRACTURE Right 06/03/2018   Procedure: OPEN REDUCTION INTERNAL FIXATION (ORIF) TRIMALLEOLAR ANKLE FRACTURE WITH SYNDESMOSIS;  Surgeon: Josefina Chew, MD;  Location: [facility];  Service: Orthopedics;  Laterality: Right;     A IV  Location/Drains/Wounds Patient Lines/Drains/Airways Status     Active Line/Drains/Airways     Name Placement date Placement time Site Days   Peripheral IV 02/03/23 20 G Right Antecubital 02/03/23  1210  Antecubital  1   Peripheral IV 02/04/23 20 G Anterior;Right Forearm 02/04/23  --  Forearm  less than 1   External Urinary Catheter 02/04/23  --  --  less than 1            Intake/Output Last 24 hours  Intake/Output Summary (Last 24 hours) at 02/04/2023 1126 Last data filed at 02/04/2023 0616 Gross per 24 hour  Intake 2913.62 ml  Output --  Net 2913.62 ml    Labs/Imaging Results for orders placed or performed during the hospital encounter of 02/03/23 (from the past 48 hours)  Comprehensive metabolic panel     Status: Abnormal   Collection Time: 02/03/23 10:13 AM  Result Value Ref Range   Sodium 131 (L) 135 - 145 mmol/L   Potassium 4.0 3.5 - 5.1 mmol/L   Chloride 106 98 - 111 mmol/L   CO2 14 (L) 22 - 32 mmol/L   Glucose, Bld 108 (H) 70 - 99 mg/dL    Comment: Glucose reference range applies only to samples taken after fasting for at least 8 hours.   BUN 16 6 - 20 mg/dL   Creatinine, Ser 8.54 (H) 0.61 - 1.24 mg/dL   Calcium  8.2 (L) 8.9 - 10.3 mg/dL   Total Protein 89.3 (H) 6.5 - 8.1 g/dL   Albumin  2.2 (L) 3.5 - 5.0 g/dL   AST 85 (H) 15 - 41 U/L  ALT 24 0 - 44 U/L   Alkaline Phosphatase 80 38 - 126 U/L   Total Bilirubin 2.9 (H) 0.0 - 1.2 mg/dL   GFR, Estimated 56 (L) >60 mL/min    Comment: (NOTE) Calculated using the CKD-EPI Creatinine Equation (2021)    Anion gap 11 5 - 15    Comment: Performed at [facility], [address] [place], [place] [address]  CBC with Differential     Status: Abnormal   Collection Time: 02/03/23 10:13 AM  Result Value Ref Range   WBC 8.7 4.0 - 10.5 K/uL   RBC 4.54 4.22 - 5.81 MIL/uL   Hemoglobin 16.1 13.0 - 17.0 g/dL   HCT 52.9 60.9 - 47.9 %   MCV 103.5 (H) 80.0 - 100.0 fL   MCH 35.5 (H) 26.0 - 34.0 pg   MCHC 34.3 30.0 - 36.0  g/dL   RDW 85.9 88.4 - 84.4 %   Platelets 58 (L) 150 - 400 K/uL    Comment: Immature Platelet Fraction may be clinically indicated, consider ordering this additional test [id number] REPEATED TO VERIFY PLATELET COUNT CONFIRMED BY SMEAR    nRBC 0.0 0.0 - 0.2 %   Neutrophils Relative % 84 %   Neutro Abs 7.3 1.7 - 7.7 K/uL   Lymphocytes Relative 6 %   Lymphs Abs 0.5 (L) 0.7 - 4.0 K/uL   Monocytes Relative 9 %   Monocytes Absolute 0.8 0.1 - 1.0 K/uL   Eosinophils Relative 0 %   Eosinophils Absolute 0.0 0.0 - 0.5 K/uL   Basophils Relative 0 %   Basophils Absolute 0.0 0.0 - 0.1 K/uL   Immature Granulocytes 1 %   Abs Immature Granulocytes 0.07 0.00 - 0.07 K/uL    Comment: Performed at [facility], [address] [place], [place] [address]  I-Stat Lactic Acid, ED     Status: Abnormal   Collection Time: 02/03/23 10:26 AM  Result Value Ref Range   Lactic Acid, Venous 5.0 (HH) 0.5 - 1.9 mmol/L   Comment NOTIFIED PHYSICIAN   Blood culture (routine x 2)     Status: None (Preliminary result)   Collection Time: 02/03/23 10:40 AM   Specimen: BLOOD LEFT FOREARM  Result Value Ref Range   Specimen Description BLOOD LEFT FOREARM    Special Requests      BOTTLES DRAWN AEROBIC AND ANAEROBIC Blood Culture adequate volume   Culture      NO GROWTH < 24 HOURS Performed at [facility], [address] [place], [place] [address]    Report Status PENDING   Blood culture (routine x 2)     Status: None (Preliminary result)   Collection Time: 02/03/23 10:45 AM   Specimen: BLOOD  Result Value Ref Range   Specimen Description BLOOD RIGHT ANTECUBITAL    Special Requests      BOTTLES DRAWN AEROBIC AND ANAEROBIC Blood Culture results may not be optimal due to an inadequate volume of blood received in culture bottles   Culture  Setup Time      GRAM POSITIVE COCCI IN PAIRS AEROBIC BOTTLE ONLY CRITICAL RESULT CALLED TO, READ BACK BY AND VERIFIED WITH: V BRYK,PHARMD@0420  02/04/23 MK Performed  at [facility], [address] [place], [place] [address]    Culture GRAM POSITIVE COCCI IN PAIRS    Report Status PENDING   Blood Culture ID Panel (Reflexed)     Status: Abnormal   Collection Time: 02/03/23 10:45 AM  Result Value Ref  Range   Enterococcus faecalis NOT DETECTED NOT DETECTED   Enterococcus Faecium NOT DETECTED NOT DETECTED   Listeria monocytogenes NOT DETECTED NOT DETECTED   Staphylococcus species NOT DETECTED NOT DETECTED   Staphylococcus aureus (BCID) NOT DETECTED NOT DETECTED   Staphylococcus epidermidis NOT DETECTED NOT DETECTED   Staphylococcus lugdunensis NOT DETECTED NOT DETECTED   Streptococcus species DETECTED (A) NOT DETECTED    Comment: CRITICAL RESULT CALLED TO, READ BACK BY AND VERIFIED WITH: V BRYK,PHARMD@0420  02/04/23 MK    Streptococcus agalactiae NOT DETECTED NOT DETECTED   Streptococcus pneumoniae DETECTED (A) NOT DETECTED    Comment: CRITICAL RESULT CALLED TO, READ BACK BY AND VERIFIED WITH: V BRYK,PHARMD@0420  02/04/23 MK    Streptococcus pyogenes NOT DETECTED NOT DETECTED   A.calcoaceticus-baumannii NOT DETECTED NOT DETECTED   Bacteroides fragilis NOT DETECTED NOT DETECTED   Enterobacterales NOT DETECTED NOT DETECTED   Enterobacter cloacae complex NOT DETECTED NOT DETECTED   Escherichia coli NOT DETECTED NOT DETECTED   Klebsiella aerogenes NOT DETECTED NOT DETECTED   Klebsiella oxytoca NOT DETECTED NOT DETECTED   Klebsiella pneumoniae NOT DETECTED NOT DETECTED   Proteus species NOT DETECTED NOT DETECTED   Salmonella species NOT DETECTED NOT DETECTED   Serratia marcescens NOT DETECTED NOT DETECTED   Haemophilus influenzae NOT DETECTED NOT DETECTED   Neisseria meningitidis NOT DETECTED NOT DETECTED   Pseudomonas aeruginosa NOT DETECTED NOT DETECTED   Stenotrophomonas maltophilia NOT DETECTED NOT DETECTED   Candida albicans NOT DETECTED NOT DETECTED   Candida auris NOT DETECTED NOT DETECTED   Candida glabrata NOT DETECTED NOT DETECTED    Candida krusei NOT DETECTED NOT DETECTED   Candida parapsilosis NOT DETECTED NOT DETECTED   Candida tropicalis NOT DETECTED NOT DETECTED   Cryptococcus neoformans/gattii NOT DETECTED NOT DETECTED    Comment: Performed at [facility], [address], [place], [place] [address]  Ethanol     Status: None   Collection Time: 02/03/23 11:07 AM  Result Value Ref Range   Alcohol, Ethyl (B) <10 <10 mg/dL    Comment: (NOTE) Lowest detectable limit for serum alcohol is 10 mg/dL.  For medical purposes only. Performed at [facility], [address] [place], [place] [address]   Urinalysis, w/ Reflex to Culture (Infection Suspected) -Urine, Clean Catch     Status: Abnormal   Collection Time: 02/03/23  2:55 PM  Result Value Ref Range   Specimen Source URINE, CLEAN CATCH    Color, Urine AMBER (A) YELLOW    Comment: BIOCHEMICALS MAY BE AFFECTED BY COLOR   APPearance CLEAR CLEAR   Specific Gravity, Urine >1.046 (H) 1.005 - 1.030   pH 5.0 5.0 - 8.0   Glucose, UA NEGATIVE NEGATIVE mg/dL   Hgb urine dipstick LARGE (A) NEGATIVE   Bilirubin Urine NEGATIVE NEGATIVE   Ketones, ur NEGATIVE NEGATIVE mg/dL   Protein, ur 899 (A) NEGATIVE mg/dL   Nitrite NEGATIVE NEGATIVE   Leukocytes,Ua NEGATIVE NEGATIVE   RBC / HPF 21-50 0 - 5 RBC/hpf   WBC, UA 0-5 0 - 5 WBC/hpf    Comment:        Reflex urine culture not performed if WBC <=10, OR if Squamous epithelial cells >5. If Squamous epithelial cells >5 suggest recollection.    Bacteria, UA RARE (A) NONE SEEN   Squamous Epithelial / HPF 0-5 0 - 5 /HPF   Mucus PRESENT     Comment: Performed at [facility], [address] [place], [place] [address]  Rapid  urine drug screen (hospital performed)     Status: None   Collection Time: 02/03/23  2:55 PM  Result Value Ref Range   Opiates NONE DETECTED NONE DETECTED   Cocaine NONE DETECTED NONE DETECTED   Benzodiazepines NONE DETECTED NONE DETECTED   Amphetamines NONE DETECTED NONE  DETECTED   Tetrahydrocannabinol NONE DETECTED NONE DETECTED   Barbiturates NONE DETECTED NONE DETECTED    Comment: (NOTE) DRUG SCREEN FOR MEDICAL PURPOSES ONLY.  IF CONFIRMATION IS NEEDED FOR ANY PURPOSE, NOTIFY LAB WITHIN 5 DAYS.  LOWEST DETECTABLE LIMITS FOR URINE DRUG SCREEN Drug Class                     Cutoff (ng/mL) Amphetamine and metabolites    1000 Barbiturate and metabolites    200 Benzodiazepine                 200 Opiates and metabolites        300 Cocaine and metabolites        300 THC                            50 Performed at [facility], [address], [place], [place] [address]   HIV Antibody (routine testing w rflx)     Status: Abnormal   Collection Time: 02/03/23  3:03 PM  Result Value Ref Range   HIV Screen 4th Generation wRfx Reactive (A) Non Reactive    Comment: RESULT REPEATED AND VERIFIED (NOTE) Reactive result does not distinguish HIV-1 p24 antigen, HIV-1  antibody, HIV-2 antibody, and HIV-1 group O antibody. Results  reactive by HIV Antigen/Antibody EIA must be confirmed. Sent for  confirmation. Performed at [facility], [address] [place], [place] [address]   RPR     Status: Abnormal   Collection Time: 02/03/23  3:03 PM  Result Value Ref Range   RPR Ser Ql Reactive (A) NON REACTIVE    Comment: SENT FOR CONFIRMATION   RPR Titer 1:2     Comment: Performed at [facility], [address], [place], [place] [address]  Technologist smear review     Status: None   Collection Time: 02/03/23  3:03 PM  Result Value Ref Range   WBC MORPHOLOGY MORPHOLOGY UNREMARKABLE    RBC MORPHOLOGY MORPHOLOGY UNREMARKABLE    Plt Morphology DECREASED    Clinical Information Thrombocytopenia     Comment: Performed at [facility], [address], [place], [place] [address]  Immature Platelet Fraction     Status: Abnormal   Collection Time: 02/03/23  3:03 PM  Result Value Ref Range   Immature Platelet Fraction 11.3 (H) 1.2 - 8.6 %     Comment:        An elevated IPF indicates increased platelet production. A low platelet count with an elevated IPF may be associated with peripheral platelet destruction (e.g. DIC, ITP) or bone marrow recovery (e.g. after chemotherapy or transplant). A low platelet count with a low or non- elevated IPF is consistent with a platelet production disorder. Performed at [facility], [address] [place], [place] [address]   Osmolality     Status: Abnormal   Collection Time: 02/03/23  3:03 PM  Result Value Ref Range   Osmolality 303 (H) 275 - 295 mOsm/kg    Comment: Performed at [facility], [address] [place], [place] [address]  TSH  Status: None   Collection Time: 02/03/23  3:03 PM  Result Value Ref Range   TSH 2.154 0.350 - 4.500 uIU/mL    Comment: Performed by a 3rd Generation assay with a functional sensitivity of <=0.01 uIU/mL. Performed at [facility], [address] [place], [place] [address]   I-Stat Lactic Acid, ED     Status: Abnormal   Collection Time: 02/03/23  3:14 PM  Result Value Ref Range   Lactic Acid, Venous 5.0 (HH) 0.5 - 1.9 mmol/L   Comment NOTIFIED PHYSICIAN   Acetaminophen  level     Status: Abnormal   Collection Time: 02/03/23  3:41 PM  Result Value Ref Range   Acetaminophen  (Tylenol ), Serum <10 (L) 10 - 30 ug/mL    Comment: (NOTE) Therapeutic concentrations vary significantly. A range of 10-30 ug/mL  may be an effective concentration for many patients. However, some  are best treated at concentrations outside of this range. Acetaminophen  concentrations >150 ug/mL at 4 hours after ingestion  and >50 ug/mL at 12 hours after ingestion are often associated with  toxic reactions.  Performed at [facility], [address] [place], [place] [address]   Salicylate level     Status: Abnormal   Collection Time: 02/03/23  3:41 PM  Result Value Ref Range   Salicylate Lvl <7.0 (L) 7.0 - 30.0 mg/dL    Comment: Performed at  [facility], [address], [place], [place] [address]  Beta-hydroxybutyric acid     Status: None   Collection Time: 02/03/23  3:41 PM  Result Value Ref Range   Beta-Hydroxybutyric Acid 0.11 0.05 - 0.27 mmol/L    Comment: Performed at [facility], [address], [place], [place] [address]  I-Stat CG4 Lactic Acid     Status: Abnormal   Collection Time: 02/03/23  8:17 PM  Result Value Ref Range   Lactic Acid, Venous 2.7 (HH) 0.5 - 1.9 mmol/L   Comment NOTIFIED PHYSICIAN   Volatiles,Blood (acetone,ethanol,isoprop,methanol)     Status: None   Collection Time: 02/03/23  9:58 PM  Result Value Ref Range   Acetone, blood <0.010 0.000 - 0.010 g/dL    Comment: (NOTE) This test was developed and its performance characteristics determined by [facility]. It has not been cleared or approved by the Food and Drug Administration.                                Detection Limit = 0.010 Performed At: [facility] [address] [place], [place] [id number] Jennette Shorter MD Ey:[id number]    Ethanol, blood <0.010 0.000 - 0.010 g/dL    Comment: (NOTE) Reported result of <0.010 for all analytes on blood volatiles panel for patient Russell [name] at [facility] on 02/04/23. Spoke to [employer]. in lab at 04:31 on 02/04/23. This test was developed and its performance characteristics determined by [facility]. It has not been cleared or approved by the Food and Drug Administration.                                Detection Limit = 0.010    Isopropanol, blood <0.010 0.000 - 0.010 g/dL    Comment: (NOTE) This test was developed and its performance characteristics determined by [facility]. It has not been cleared or approved by the Food and Drug Administration.  Detection Limit = 0.010    Methanol, blood <0.010 0.000 - 0.010 g/dL    Comment: (NOTE) This test was developed and its performance characteristics determined by [facility]. It has not been cleared or  approved by the Food and Drug Administration.                                Detection Limit = 0.010   Cd4/cd8 (t-helper/t-suppressor cell)     Status: Abnormal   Collection Time: 02/03/23  9:58 PM  Result Value Ref Range   Total lymphocyte count 979 (L) 1,000 - 4,000 /uL   CD4% 11.84 (L) 33 - 65 %   CD4 absolute 116 (L) 400 - 1,790 /uL   CD8tox 71.63 (H) 12 - 40 %   CD8 T Cell Abs 702 190 - 1,000 /uL   Ratio 0.17 (L) 1.0 - 3.0    Comment: Performed at [facility], [address], [place], [place] [address]  Hepatitis B surface antigen     Status: None   Collection Time: 02/03/23  9:58 PM  Result Value Ref Range   Hepatitis B Surface Ag NON REACTIVE NON REACTIVE    Comment: Performed at [facility], [address] [place], [place] [address]  Cryptococcal antigen     Status: None   Collection Time: 02/03/23  9:58 PM  Result Value Ref Range   Crypto Ag NEGATIVE NEGATIVE   Cryptococcal Ag Titer NOT INDICATED NOT INDICATED    Comment: Performed at [facility], [address], [place], [place] [address]  I-Stat CG4 Lactic Acid     Status: None   Collection Time: 02/03/23 10:08 PM  Result Value Ref Range   Lactic Acid, Venous 1.8 0.5 - 1.9 mmol/L  Ammonia     Status: Abnormal   Collection Time: 02/04/23  5:29 AM  Result Value Ref Range   Ammonia 36 (H) 9 - 35 umol/L    Comment: Performed at [facility], [address] [place], [place] [address]  TSH     Status: None   Collection Time: 02/04/23  5:29 AM  Result Value Ref Range   TSH 4.140 0.350 - 4.500 uIU/mL    Comment: Performed by a 3rd Generation assay with a functional sensitivity of <=0.01 uIU/mL. Performed at [facility], [address], [place], [place] [address]   CBC     Status: Abnormal   Collection Time: 02/04/23  5:29 AM  Result Value Ref Range   WBC 8.9 4.0 - 10.5 K/uL   RBC 3.86 (L) 4.22 - 5.81 MIL/uL   Hemoglobin 13.1 13.0 - 17.0 g/dL   HCT 61.7 (L) 60.9 - 47.9  %   MCV 99.0 80.0 - 100.0 fL   MCH 33.9 26.0 - 34.0 pg   MCHC 34.3 30.0 - 36.0 g/dL   RDW 86.2 88.4 - 84.4 %   Platelets 46 (L) 150 - 400 K/uL    Comment: Immature Platelet Fraction may be clinically indicated, consider ordering this additional test [id number] REPEATED TO VERIFY    nRBC 0.0 0.0 - 0.2 %    Comment: Performed at [facility], [address], [place], [place] [address]  Basic metabolic panel     Status: Abnormal   Collection Time: 02/04/23  5:29 AM  Result Value Ref Range   Sodium 133 (L) 135 - 145 mmol/L  Potassium 3.7 3.5 - 5.1 mmol/L   Chloride 108 98 - 111 mmol/L   CO2 18 (L) 22 - 32 mmol/L   Glucose, Bld 94 70 - 99 mg/dL    Comment: Glucose reference range applies only to samples taken after fasting for at least 8 hours.   BUN 18 6 - 20 mg/dL   Creatinine, Ser 8.61 (H) 0.61 - 1.24 mg/dL   Calcium  7.6 (L) 8.9 - 10.3 mg/dL   GFR, Estimated 59 (L) >60 mL/min    Comment: (NOTE) Calculated using the CKD-EPI Creatinine Equation (2021)    Anion gap 7 5 - 15    Comment: Performed at [facility], [address] [place], [place] [address]   EEG adult Result Date: 02/03/2023 Shelton Arlin KIDD, MD     02/03/2023  3:44 PM Patient Name: Russell [name] MRN: [id number] Epilepsy Attending: Arlin KIDD Shelton Referring Physician/Provider: Norrine Sharper, MD Date: 02/03/2023 Duration: 23.21 mins Patient history: 58yo M with ams getting eeg to evaluate for seizure Level of alertness: Awake AEDs during EEG study: Ativan  Technical aspects: This EEG study was done with scalp electrodes positioned according to the 10-20 International system of electrode placement. Electrical activity was reviewed with band pass filter of 1-70Hz , sensitivity of 7 uV/mm, display speed of 68mm/sec with a 60Hz  notched filter applied as appropriate. EEG data were recorded continuously and digitally stored.  Video monitoring was available and reviewed as appropriate. Description: The posterior dominant  rhythm consists of 7.5 Hz activity of moderate voltage (25-35 uV) seen predominantly in posterior head regions, symmetric and reactive to eye opening and eye closing. EEG showed continuous generalized 5 to 7 Hz theta slowing. Hyperventilation and photic stimulation were not performed.   ABNORMALITY - Continuous slow, generalized IMPRESSION: This study is suggestive of moderate diffuse encephalopathy. No seizures or epileptiform discharges were seen throughout the recording. Arlin KIDD Shelton   CT CHEST ABDOMEN PELVIS W CONTRAST Result Date: 02/03/2023 CLINICAL DATA:  58 year old male with sepsis.  Delirium. EXAM: CT CHEST, ABDOMEN, AND PELVIS WITH CONTRAST TECHNIQUE: Multidetector CT imaging of the chest, abdomen and pelvis was performed following the standard protocol during bolus administration of intravenous contrast. RADIATION DOSE REDUCTION: This exam was performed according to the departmental dose-optimization program which includes automated exposure control, adjustment of the mA and/or kV according to patient size and/or use of iterative reconstruction technique. CONTRAST:  75mL OMNIPAQUE  IOHEXOL  350 MG/ML SOLN COMPARISON:  Portable chest 1045 hours today. FINDINGS: CT CHEST FINDINGS Cardiovascular: Calcified coronary artery atherosclerosis (series 505, image 37). Calcified aortic atherosclerosis. Cardiac size at the upper limits of normal. No pericardial effusion. Grossly patent central pulmonary arteries. Mediastinum/Nodes: Negative, no mediastinal mass or lymphadenopathy. Lungs/Pleura: Major airways are patent with mild respiratory motion. Right lung is largely clear. Minimal mosaic attenuation in the right lower lobe probably gas trapping and atelectasis. Confluent opacity in the left lower lobe anterior basal segment and medial basal segment, partially masslike but also indistinct and appears to be inflammatory (series 506, image 118). No left pleural effusion. Elsewhere the left lung is clear.  Musculoskeletal: Partially visible lower cervical spine degeneration. No acute or suspicious osseous abnormality identified in the chest. CT ABDOMEN PELVIS FINDINGS Hepatobiliary: Early phase of liver enhancement. No definite liver abnormality. Layering gravel type gallstones in the gallbladder on series 505, image 71. No pericholecystic inflammation. No bile duct enlargement. Pancreas: Negative. Spleen: Negative. Adrenals/Urinary Tract: Normal adrenal glands. 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 Symmetric renal enhancement,  kidneys appear nonobstructed. No delayed renal images. No nephrolithiasis identified. Diminutive urinary bladder. Stomach/Bowel: Nondilated large and small bowel with retained low-density stool in the distal colon. No oral contrast administered. Appendix not identified. No convincing large bowel inflammation. No free air or free fluid identified. Decompressed stomach. Duodenum motion artifact. Vascular/Lymphatic: Extensive Aortoiliac calcified atherosclerosis. Normal caliber abdominal aorta. Major arterial structures in the abdomen and pelvis remain patent. No lymphadenopathy. Portal venous system not evaluated, no delayed images provided. Reproductive: Negative. Other: No convincing pelvis free fluid. Musculoskeletal: Chronic severe lower lumbar disc and endplate degeneration maximal at L4-L5. No acute or suspicious osseous abnormality identified. IMPRESSION: 1. Left lower lobe Pneumonia. No pleural effusion. 2. No other acute or inflammatory process identified in the chest, abdomen, or pelvis. 3. Cholelithiasis. Horseshoe kidney (normal variant). Aortic Atherosclerosis (ICD10-I70.0). Electronically Signed   By: VEAR Hurst M.D.   On: 02/03/2023 11:58   CT Head Wo Contrast Result Date: 02/03/2023 CLINICAL DATA:  Delirium.  Sepsis. EXAM: CT HEAD WITHOUT CONTRAST TECHNIQUE: Contiguous axial images were obtained from the base of the skull through the vertex without intravenous  contrast. RADIATION DOSE REDUCTION: This exam was performed according to the departmental dose-optimization program which includes automated exposure control, adjustment of the mA and/or kV according to patient size and/or use of iterative reconstruction technique. COMPARISON:  Head CT 05/25/2018 FINDINGS: Brain: There is no evidence of an acute infarct, intracranial hemorrhage, mass, midline shift, or extra-axial fluid collection. Cerebral atrophy has progressed and is moderately advanced for age. Cerebral white matter hypodensities are nonspecific but compatible with mild chronic small vessel ischemic disease. Vascular: Calcified atherosclerosis at the skull base. No hyperdense vessel. Skull: No acute fracture or suspicious osseous lesion. Sinuses/Orbits: Mild mucosal thickening in the left sphenoid sinus. Clear mastoid air cells. Left cataract extraction. Other: None. IMPRESSION: 1. No evidence of acute intracranial abnormality. 2. Mild chronic small vessel ischemic disease. 3. Moderately advanced cerebral atrophy. Electronically Signed   By: Dasie Hamburg M.D.   On: 02/03/2023 11:55   DG Chest Portable 1 View Result Date: 02/03/2023 CLINICAL DATA:  Hypertension.  Altered mental status. EXAM: PORTABLE CHEST 1 VIEW COMPARISON:  None Available. FINDINGS: Low lung volume. Mild apparent increased interstitial markings are nonspecific. No frank pulmonary edema. Bilateral lung fields are clear. No dense consolidation or lung collapse. Bilateral costophrenic angles are clear. Normal cardio-mediastinal silhouette. No acute osseous abnormalities. The soft tissues are within normal limits. IMPRESSION: *Mild apparent increased interstitial markings, nonspecific. Otherwise no acute cardiopulmonary abnormality. Electronically Signed   By: Ree Molt M.D.   On: 02/03/2023 11:00    Pending Labs Unresulted Labs (From admission, onward)     Start     Ordered   02/04/23 1115  RNA, PCR (Graph) rfx/Geno EDI  Once,   R         02/04/23 1114   02/04/23 1115  GenoSure Integrase HIV EDI  Once,   R        02/04/23 1114   02/04/23 1115  Glucose 6 phosphate dehydrogenase  Once,   R        02/04/23 1114   02/04/23 0500  Hepatitis A antibody, total  Tomorrow morning,   R        02/03/23 2145   02/04/23 0500  Hepatitis B surface antibody,quantitative  Tomorrow morning,   R        02/03/23 2145   02/04/23 0500  HCV Ab w Reflex to Quant PCR  Tomorrow morning,  R        02/03/23 2145   02/03/23 2143  HIV-1 RNA quant-no reflex-bld  Once,   R        02/03/23 2145   02/03/23 1824  MRSA Next Gen by PCR, Nasal  (MRSA Screening)  Once,   R        02/03/23 1823   02/03/23 1503  HIV-1/2 AB - differentiation  Once,   AD        02/03/23 1503   02/03/23 1503  T.pallidum Ab, Total  Once,   AD        02/03/23 1503   02/03/23 1432  Legionella Pneumophila Serogp 1 Ur Ag  Once,   R        02/03/23 1432   02/03/23 1432  Strep pneumoniae urinary antigen  Once,   R        02/03/23 1432            Vitals/Pain Today's Vitals   02/04/23 1000 02/04/23 1024 02/04/23 1030 02/04/23 1102  BP: 113/89  116/80   Pulse:  78 77   Resp: (!) 21  19   Temp:    97.7 F (36.5 C)  TempSrc:    Oral  SpO2:   100%   Weight:      Height:      PainSc:        Isolation Precautions No active isolations  Medications Medications  sodium chloride  flush (NS) 0.9 % injection 3 mL (3 mLs Intravenous Given 02/03/23 2155)  sodium chloride  flush (NS) 0.9 % injection 3 mL (has no administration in time range)  thiamine  (VITAMIN B1) tablet 100 mg ( Oral See Alternative 02/03/23 1516)    Or  thiamine  (VITAMIN B1) injection 100 mg (100 mg Intravenous Given 02/03/23 1516)  folic acid  (FOLVITE ) tablet 1 mg (1 mg Oral Not Given 02/03/23 1455)  multivitamin with minerals tablet 1 tablet (0 tablets Oral Hold 02/03/23 1453)  LORazepam  (ATIVAN ) tablet 1-4 mg ( Oral See Alternative 02/04/23 0754)    Or  LORazepam  (ATIVAN ) injection 1-4 mg (1 mg Intravenous Given  02/04/23 0754)  lactated ringers  infusion ( Intravenous Infusion Verify 02/04/23 0616)  cefTRIAXone  (ROCEPHIN ) 2 g in sodium chloride  0.9 % 100 mL IVPB (0 g Intravenous Stopped 02/04/23 1011)  bictegravir-emtricitabine -tenofovir  AF (BIKTARVY ) 50-200-25 MG per tablet 1 tablet (has no administration in time range)  lactated ringers  bolus 1,000 mL ( Intravenous Stopped 02/03/23 1336)  LORazepam  (ATIVAN ) injection 1 mg (1 mg Intravenous Given 02/03/23 1514)  Vancomycin  (VANCOCIN ) 1,500 mg in sodium chloride  0.9 % 500 mL IVPB (0 mg Intravenous Stopped 02/03/23 1401)  ceFEPIme  (MAXIPIME ) 2 g in sodium chloride  0.9 % 100 mL IVPB (0 g Intravenous Stopped 02/03/23 1246)  iohexol  (OMNIPAQUE ) 350 MG/ML injection 75 mL (75 mLs Intravenous Contrast Given 02/03/23 1141)  lactated ringers  bolus 1,000 mL (0 mLs Intravenous Stopped 02/03/23 1929)    Mobility walks     Focused Assessments     R Recommendations: See Admitting Provider Note  Report given to:   Additional Notes: patient is alert oriented to self and place, Patient is not steady to be ambulated, he does not comprehend that he has to stay at the hospital to be treated, he keeps on stating that he needs to go home. Patient has been told multiple times that he is being admitted, but does not understand why even with every provider explaining to him.

## 2023-02-04 NOTE — Telephone Encounter (Signed)
 Pharmacy Patient Advocate Encounter  Insurance verification completed.    The patient is insured through [employer] and [facility] MEDICAID. Patient has [employer], may use a copay card, and/or apply for patient assistance if available.    Ran test claim for Biktarvyand the current 30 day co-pay is $0.00.   This test claim was processed through [facility]- copay amounts may vary at other pharmacies due to pharmacy/plan contracts, or as the patient moves through the different stages of their insurance plan.

## 2023-02-04 NOTE — Progress Notes (Signed)
 PHARMACY - PHYSICIAN COMMUNICATION CRITICAL VALUE ALERT - BLOOD CULTURE IDENTIFICATION (BCID)  Russell [name] is an 58 y.o. male who presented to [facility] on 02/03/2023 with a chief complaint of fall and confusion.  Assessment:  Admitted and started on ABX for sepsis and CAP, now growing Strep pneumo in 1 of 4 blood cx bottles.  Name of physician (or Provider) ContactedBETHA Nordmann MD and APatel DO  Current antibiotics: vancomycin  and cefepime   Changes to prescribed antibiotics recommended:  Pt is covered on current ABX; could narrow to ceftriaxone  if other source is ruled out.  Results for orders placed or performed during the hospital encounter of 02/03/23  Blood Culture ID Panel (Reflexed) (Collected: 02/03/2023 10:45 AM)  Result Value Ref Range   Enterococcus faecalis NOT DETECTED NOT DETECTED   Enterococcus Faecium NOT DETECTED NOT DETECTED   Listeria monocytogenes NOT DETECTED NOT DETECTED   Staphylococcus species NOT DETECTED NOT DETECTED   Staphylococcus aureus (BCID) NOT DETECTED NOT DETECTED   Staphylococcus epidermidis NOT DETECTED NOT DETECTED   Staphylococcus lugdunensis NOT DETECTED NOT DETECTED   Streptococcus species DETECTED (A) NOT DETECTED   Streptococcus agalactiae NOT DETECTED NOT DETECTED   Streptococcus pneumoniae DETECTED (A) NOT DETECTED   Streptococcus pyogenes NOT DETECTED NOT DETECTED   A.calcoaceticus-baumannii NOT DETECTED NOT DETECTED   Bacteroides fragilis NOT DETECTED NOT DETECTED   Enterobacterales NOT DETECTED NOT DETECTED   Enterobacter cloacae complex NOT DETECTED NOT DETECTED   Escherichia coli NOT DETECTED NOT DETECTED   Klebsiella aerogenes NOT DETECTED NOT DETECTED   Klebsiella oxytoca NOT DETECTED NOT DETECTED   Klebsiella pneumoniae NOT DETECTED NOT DETECTED   Proteus species NOT DETECTED NOT DETECTED   Salmonella species NOT DETECTED NOT DETECTED   Serratia marcescens NOT DETECTED NOT DETECTED   Haemophilus influenzae NOT DETECTED  NOT DETECTED   Neisseria meningitidis NOT DETECTED NOT DETECTED   Pseudomonas aeruginosa NOT DETECTED NOT DETECTED   Stenotrophomonas maltophilia NOT DETECTED NOT DETECTED   Candida albicans NOT DETECTED NOT DETECTED   Candida auris NOT DETECTED NOT DETECTED   Candida glabrata NOT DETECTED NOT DETECTED   Candida krusei NOT DETECTED NOT DETECTED   Candida parapsilosis NOT DETECTED NOT DETECTED   Candida tropicalis NOT DETECTED NOT DETECTED   Cryptococcus neoformans/gattii NOT DETECTED NOT DETECTED    Marvetta Dauphin, PharmD, BCPS  02/04/2023  4:44 AM

## 2023-02-04 NOTE — Progress Notes (Signed)
 Summary: Russell [name] is a 58 y.o. male with remote history of cocaine use disorder and housing instability presenting with deteriorating mental status over the course of last 4-5 days and admitted for encephalopathy and possible sepsis.  Hospital Day: 1  Subjective:  Patient seen and evaluated while resting in bed. Patient able to communicate and participate more in today's evaluation. Patient expressed frustration for being in hospital and a desire to leave the hospital. Importance of continuing antibiotics for his infection was discussed with patient. Patient denies headache or neck stiffness.   Objective:  Vital signs in last 24 hours: Vitals:   02/04/23 1300 02/04/23 1330 02/04/23 1339 02/04/23 1400  BP: 119/87 116/81 116/81 120/83  Pulse: 80 82 82 85  Resp: (!) 22 18  17   Temp:    (!) 96.5 F (35.8 C)  TempSrc:    Axillary  SpO2: 99% 98%  99%  Weight:      Height:       Physical Exam: General: disheveled, able to communicate more than yesterday, expressed frustration Cardiovascular: RRR, no murmurs, rubs or gallops Respiratory: normal work of breathing, diffuse crackles bilaterally Extremities: warm, dry skin on bilateral legs; able to move all four extremities  Neurological: alert and oriented to person and place   Weight change:   Intake/Output Summary (Last 24 hours) at 02/04/2023 1529 Last data filed at 02/04/2023 1401 Gross per 24 hour  Intake 3153.62 ml  Output --  Net 3153.62 ml   Pertinent Labs    Latest Ref Rng & Units 02/04/2023    5:29 AM 02/03/2023   10:13 AM 05/25/2018    9:15 PM  CBC  WBC 4.0 - 10.5 K/uL 8.9  8.7  5.7   Hemoglobin 13.0 - 17.0 g/dL 86.8  83.8  85.4   Hematocrit 39.0 - 52.0 % 38.2  47.0  42.4   Platelets 150 - 400 K/uL 46  58  127       Latest Ref Rng & Units 02/04/2023    5:29 AM 02/03/2023   10:13 AM 05/25/2018    9:15 PM  CMP  Glucose 70 - 99 mg/dL 94  891  96   BUN 6 - 20 mg/dL 18  16  11    Creatinine 0.61 - 1.24 mg/dL 8.61   8.54  9.02   Sodium 135 - 145 mmol/L 133  131  131   Potassium 3.5 - 5.1 mmol/L 3.7  4.0  4.1   Chloride 98 - 111 mmol/L 108  106  99   CO2 22 - 32 mmol/L 18  14  19    Calcium  8.9 - 10.3 mg/dL 7.6  8.2  8.7   Total Protein 6.5 - 8.1 g/dL  89.3    Total Bilirubin 0.0 - 1.2 mg/dL  2.9    Alkaline Phos 38 - 126 U/L  80    AST 15 - 41 U/L  85    ALT 0 - 44 U/L  24     TSH: 2.154 -> 4.140 Lactic Acid: 5 -> 2.7 -> 1.8 Osmolality: 303  Immature Platelet Fraction: 11.3  Lymphocyte Subsets CD4 absolute: 116 Total lymphocyte count: 979 CD 4%: 11.84 CD 8 tox: 71.63 CD8 T Cell Abs: 702 Ratio: 0.17  Blood Culture: Streptococcus species: Detected Streptococcus pneumoniae: Detected  RPR: Reactive RPR Titer: 1:2  HIV Screen 4th Gen: Reactive   Assessment/Plan:  Principal Problem:   Pneumococcal bacteremia Active Problems:   Sepsis (HCC)   Fall   Encephalopathy  Community acquired pneumonia   Thrombocytopenia (HCC)   Hematuria   Homelessness   Alcoholism (HCC)   AIDS (acquired immune deficiency syndrome) (HCC)   Positive RPR test   Cholelithiasis without cholecystitis   Aortic atherosclerosis (HCC)   Cerebral atrophy (HCC)   Horseshoe kidney  Septic encephalopathy Severe sepsis Pneumococcal bacteremia 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 Still appears confused but able to communicate during exam today. He expressed frustration with being in hospital. Team discussed with patient the importance of receiving antibiotics with his current infection. Given blood cultures grew strep pneumoniae, antibiotics transitioned to IV ceftriaxone  2 g. HIV test positive with reflex pending. Biktarvy  started for antiretroviral therapy as CD4 count resulted 116. Recommend starting Bactrim  for for PJP prophylaxis with CD4 counts <200. RPR reactive with titers 1:2. Infectious disease following and recommends IM PCN weekly x3 doses if true positive. T pallidium  antibody ordered. Appreciate ID recommendations. Lower suspicion for substance-induced or toxic etiology given unremarkable labs and infectious source identified.   Thrombocytopenia Platelets decreased to 46 from  58 yesterday. Still suspect thrombocytopenia is 2/2 sepsis; however, cannot rule this is due to HIV infection.   Cholelithiasis without cholecystitis  Finding found on CT A/P on admission. Stable. Patient does not endorse RUQ pain. Recommend following outpatient.  Horseshoe kidney Finding found on CT A/P on admission. Stable. Recommend following outpatient.   Aortic atherosclerosis Finding found on CT A/P on admission. Recommend following up with lipid panel outpatient.   Moderately advanced cerebral atrophy Finding found on CT A/P on admission. May be contributing to confusion; however, given infectious sources identified, less likely. Recommend following outpatient.  Code: Full VTE Prophylaxis: None Diet: Regular Fluids: LR   Dispo: Anticipate discharge in 1-2 days.   LOS: 1 day   Saunders Damien MATSU, Medical Student 02/04/2023, 3:29 PM

## 2023-02-04 NOTE — Consult Note (Signed)
 [facility]    Date of Admission:  02/03/2023     Total days of antibiotics 2               Reason for Consult: Pneumococcal Bacteremia / Positive HIV test   Referring Provider: Dr. Rosan  Primary Care Provider: Patient, No Pcp Per   ASSESSMENT:  Russell [name] is a 58 y/o AA male admitted with 72 hours of erratic behavior and fall and found to have Streptococcus pneumoniae bacteremia and pneumonia as well as a  positive 4th generation HIV test and positive RPR titer of 1:2 with confirmatory testing pending. Narrow antibiotics to ceftriaxone  for Pneumococcal bacteremia and pneumonia. HIV antibody and RNA levels drawn and pending for confirmation of HIV status. Will start Biktarvy  preemptively given his increased risk while awaiting confirmatory results. No history of syphilis per [facility] Department and will await confirmatory testing. If positive will need 3 weekly injections of 2.4 million units of Bicillin  or possibly doxycycline. Does endorse drinking a beer daily with possible risk of alcohol withdrawal and will defer to Internal Medicine for CIWA protocol if appropriate. Discussed plan of care including basics of HIV and treatment in the event he is positive. Remaining medical and supportive care per Internal Medicine.   PLAN:  Change antibiotics to ceftriaxone .  Await confirmatory testing of HIV and Syphilis.  Start Biktarvy . Remaining medical and supportive care per Internal Medicine Team.    Principal Problem:   Sepsis [facility]) Active Problems:   Fall   Encephalopathy   Community acquired pneumonia   Thrombocytopenia (HCC)   Hematuria    bictegravir-emtricitabine -tenofovir  AF  1 tablet Oral Daily   folic acid   1 mg Oral Daily   multivitamin with minerals  1 tablet Oral Daily   sodium chloride  flush  3 mL Intravenous Q12H   thiamine   100 mg Oral Daily   Or   thiamine   100 mg Intravenous Daily     HPI: Russell [name] is a 58 y.o. male  with no significant medical history and possible history of substance use (cocaine and alcohol) presenting to the hospital with altered mental and following a fall.   Per Russell [name] family he was observed being more erratic over the past 72 hours prior to admission including urinating and defecating on himself. Afebrile with no leukocytosis. CT head with no intracranial abnormalities. CT chest/abdomen/pelvis with left lower lobe pneumonia and no other acute or inflammatory processes. Creatinine elevated at 1.45 with eGFR 56. AST 85 with ALT normal. Blood cultures positive for Streptococcus pneumoniae bacteremia. HIV 4th generation initial test positive. RPR titer positive at 1:2 with confirmatory testing pending. Cryptococcal antigen and Hepatitis B surface antigen negative. Started on broad spectrum coverage with vancomycin  and cefepime  which has now been narrowed to ceftriaxone .   Russell [name] is confused as to why he is here and asking when he can get out of here and feels all of this is unnecessary. Denies recent drug use although consumes 1 beer daily and would like a beer. Not able to elicit any additional significant information from discussion.    Review of Systems: Review of Systems  Unable to perform ROS: Mental status change     Past Medical History:  Diagnosis Date   Closed dislocation of right ankle 06/03/2018    Social History   Tobacco Use   Smoking status: Some Days   Smokeless tobacco: Never  Vaping Use   Vaping status: Never  Used  Substance Use Topics   Alcohol use: Yes    Comment: 6 pack a day   Drug use: Yes    Types: Marijuana    Comment: daily    History reviewed. No pertinent family history.  No Known Allergies  OBJECTIVE: Blood pressure 113/89, pulse 86, temperature 97.6 F (36.4 C), temperature source Oral, resp. rate (!) 21, height 5' 6 (1.676 m), weight 57.8 kg, SpO2 98%.  Physical Exam Constitutional:      General: He is not in acute distress.     Appearance: He is well-developed.  Cardiovascular:     Rate and Rhythm: Normal rate and regular rhythm.     Heart sounds: Normal heart sounds.  Pulmonary:     Effort: Pulmonary effort is normal.     Breath sounds: Normal breath sounds.  Skin:    General: Skin is warm and dry.  Neurological:     Mental Status: He is alert. He is disoriented.     Lab Results Lab Results  Component Value Date   WBC 8.9 02/04/2023   HGB 13.1 02/04/2023   HCT 38.2 (L) 02/04/2023   MCV 99.0 02/04/2023   PLT 46 (L) 02/04/2023    Lab Results  Component Value Date   CREATININE 1.38 (H) 02/04/2023   BUN 18 02/04/2023   NA 133 (L) 02/04/2023   K 3.7 02/04/2023   CL 108 02/04/2023   CO2 18 (L) 02/04/2023    Lab Results  Component Value Date   ALT 24 02/03/2023   AST 85 (H) 02/03/2023   ALKPHOS 80 02/03/2023   BILITOT 2.9 (H) 02/03/2023     Microbiology: Recent Results (from the past 240 hours)  Blood culture (routine x 2)     Status: None (Preliminary result)   Collection Time: 02/03/23 10:40 AM   Specimen: BLOOD LEFT FOREARM  Result Value Ref Range Status   Specimen Description BLOOD LEFT FOREARM  Final   Special Requests   Final    BOTTLES DRAWN AEROBIC AND ANAEROBIC Blood Culture adequate volume   Culture   Final    NO GROWTH < 24 HOURS Performed at [facility], [address], [place], [place] [address]    Report Status PENDING  Incomplete  Blood culture (routine x 2)     Status: None (Preliminary result)   Collection Time: 02/03/23 10:45 AM   Specimen: BLOOD  Result Value Ref Range Status   Specimen Description BLOOD RIGHT ANTECUBITAL  Final   Special Requests   Final    BOTTLES DRAWN AEROBIC AND ANAEROBIC Blood Culture results may not be optimal due to an inadequate volume of blood received in culture bottles   Culture  Setup Time   Final    GRAM POSITIVE COCCI IN PAIRS AEROBIC BOTTLE ONLY CRITICAL RESULT CALLED TO, READ BACK BY AND VERIFIED WITH: V BRYK,PHARMD@[id number]   02/04/23 MK Performed at [facility], [address] [place], [place] [address]    Culture GRAM POSITIVE COCCI IN PAIRS  Final   Report Status PENDING  Incomplete  Blood Culture ID Panel (Reflexed)     Status: Abnormal   Collection Time: 02/03/23 10:45 AM  Result Value Ref Range Status   Enterococcus faecalis NOT DETECTED NOT DETECTED Final   Enterococcus Faecium NOT DETECTED NOT DETECTED Final   Listeria monocytogenes NOT DETECTED NOT DETECTED Final   Staphylococcus species NOT DETECTED NOT DETECTED Final   Staphylococcus aureus (BCID) NOT DETECTED NOT DETECTED Final   Staphylococcus  epidermidis NOT DETECTED NOT DETECTED Final   Staphylococcus lugdunensis NOT DETECTED NOT DETECTED Final   Streptococcus species DETECTED (A) NOT DETECTED Final    Comment: CRITICAL RESULT CALLED TO, READ BACK BY AND VERIFIED WITH: V BRYK,PHARMD@0420  02/04/23 MK    Streptococcus agalactiae NOT DETECTED NOT DETECTED Final   Streptococcus pneumoniae DETECTED (A) NOT DETECTED Final    Comment: CRITICAL RESULT CALLED TO, READ BACK BY AND VERIFIED WITH: V BRYK,PHARMD@0420  02/04/23 MK    Streptococcus pyogenes NOT DETECTED NOT DETECTED Final   A.calcoaceticus-baumannii NOT DETECTED NOT DETECTED Final   Bacteroides fragilis NOT DETECTED NOT DETECTED Final   Enterobacterales NOT DETECTED NOT DETECTED Final   Enterobacter cloacae complex NOT DETECTED NOT DETECTED Final   Escherichia coli NOT DETECTED NOT DETECTED Final   Klebsiella aerogenes NOT DETECTED NOT DETECTED Final   Klebsiella oxytoca NOT DETECTED NOT DETECTED Final   Klebsiella pneumoniae NOT DETECTED NOT DETECTED Final   Proteus species NOT DETECTED NOT DETECTED Final   Salmonella species NOT DETECTED NOT DETECTED Final   Serratia marcescens NOT DETECTED NOT DETECTED Final   Haemophilus influenzae NOT DETECTED NOT DETECTED Final   Neisseria meningitidis NOT DETECTED NOT DETECTED Final   Pseudomonas aeruginosa NOT DETECTED NOT DETECTED  Final   Stenotrophomonas maltophilia NOT DETECTED NOT DETECTED Final   Candida albicans NOT DETECTED NOT DETECTED Final   Candida auris NOT DETECTED NOT DETECTED Final   Candida glabrata NOT DETECTED NOT DETECTED Final   Candida krusei NOT DETECTED NOT DETECTED Final   Candida parapsilosis NOT DETECTED NOT DETECTED Final   Candida tropicalis NOT DETECTED NOT DETECTED Final   Cryptococcus neoformans/gattii NOT DETECTED NOT DETECTED Final    Comment: Performed at [facility], [address], [place], [place] [address]     Cathlyn July, NP [facility]  02/04/2023  10:19 AM

## 2023-02-04 NOTE — Evaluation (Signed)
 Speech Language Pathology Evaluation Patient Details Name: Russell [name] MRN: [id number] DOB: 25-Jul-1965 Today's Date: 02/04/2023 Time: 0925-0950 SLP Time Calculation (min) (ACUTE ONLY): 25 min  Problem List:  Patient Active Problem List   Diagnosis Date Noted   Sepsis (HCC) 02/03/2023   Fall 02/03/2023   Encephalopathy 02/03/2023   Community acquired pneumonia 02/03/2023   Thrombocytopenia (HCC) 02/03/2023   Hematuria 02/03/2023   Closed dislocation of right ankle 06/03/2018   Past Medical History:  Past Medical History:  Diagnosis Date   Closed dislocation of right ankle 06/03/2018   Past Surgical History:  Past Surgical History:  Procedure Laterality Date   ORIF ANKLE FRACTURE Right 06/03/2018   Procedure: OPEN REDUCTION INTERNAL FIXATION (ORIF) TRIMALLEOLAR ANKLE FRACTURE WITH SYNDESMOSIS;  Surgeon: Josefina Chew, MD;  Location: [facility];  Service: Orthopedics;  Laterality: Right;   HPI:  Russell [name] is a 58 y.o. male who presented to ED following a fall at home with altered mental status. Pt with possible remote cocaine and alcohol use and no other known significant past medical history.   Assessment / Plan / Recommendation Clinical Impression  Pt presents with severe-profound cognitive-linguistic deficits.  He scored 0 of 30 on the SLUMS.  Suspect poor effort/participation as pt frequently did not respond, but pt also exhibited inattention and poor memory.  Pt oriented to self only and city.  Could not name state.  He was not oriented to time.  With naming task, pt generated 3 items in one minute.  He was easily distracted and reorinted several times during this task.  Pt required redirection frequently during evaluation.  He did not benefit from cuing or repetition with memory, naming, attention, calculation, orientation, or visuospatial tasks.  Pt became increasing agitated of course of evaluation.  At present, pt does not appear able to care for himself and  will require 24 hour supervision.  Pt is not a good candidate for speech therapy given level of participation at this time.    Pt's speech is clear and without dysarthria.  His spontaneous speech was grammatically correct and without obvious word finding deficits, but was largely focused on leaving and avoiding participation in this assessment.  SLP will follow remotely for reassessment if confusion and level of engagement improve.    SLP Assessment  SLP Recommendation/Assessment:  (Re-evaluate upon improvement) SLP Visit Diagnosis: Cognitive communication deficit (R41.841)    Recommendations for follow up therapy are one component of a multi-disciplinary discharge planning process, led by the attending physician.  Recommendations may be updated based on patient status, additional functional criteria and insurance authorization.    Follow Up Recommendations  Long-term institutional care without follow-up therapy    Assistance Recommended at Discharge   Recommend 24/7 supervision  Functional Status Assessment Patient has had a recent decline in their functional status and demonstrates the ability to make significant improvements in function in a reasonable and predictable amount of time. (Baseline unknown, pt with severe impairments)  Frequency and Duration  (TBD)         SLP Evaluation Cognition  Overall Cognitive Status: No family/caregiver present to determine baseline cognitive functioning Arousal/Alertness: Awake/alert Orientation Level: Oriented to person Year:  (NR) Month:  (NR) Day of Week:  (NR) Attention: Focused;Sustained Focused Attention: Impaired Focused Attention Impairment: Verbal basic;Functional basic Sustained Attention: Impaired Sustained Attention Impairment: Verbal basic;Functional basic Memory: Impaired Memory Impairment: Storage deficit;Retrieval deficit Awareness: Impaired Problem Solving: Impaired Problem Solving Impairment: Verbal basic Executive  Function: Reasoning Reasoning:  Impaired Behaviors: Poor frustration tolerance Safety/Judgment: Impaired       Comprehension  Auditory Comprehension Overall Auditory Comprehension: Impaired Yes/No Questions: Impaired Basic Immediate Environment Questions: 0-24% accurate Commands: Impaired Conversation: Simple Visual Recognition/Discrimination Discrimination: Not tested Reading Comprehension Reading Status: Not tested    Expression Expression Primary Mode of Expression: Verbal Verbal Expression Overall Verbal Expression: Impaired Automatic Speech: Social Response;Name Level of Generative/Spontaneous Verbalization: Phrase;Sentence Repetition: Impaired Level of Impairment: Word level Naming: Impairment Responsive: 0-25% accurate Interfering Components: Attention   Oral / Motor  Oral Motor/Sensory Function Overall Oral Motor/Sensory Function:  (Could not test) Motor Speech Overall Motor Speech: Appears within functional limits for tasks assessed Respiration: Within functional limits Phonation: Normal Resonance: Within functional limits Articulation: Within functional limitis Intelligibility: Intelligible Motor Planning: Witnin functional limits Motor Speech Errors: Not applicable            Anette FORBES Grippe, MA, CCC-SLP Acute Rehabilitation Services Office: [phone] 02/04/2023, 9:58 AM

## 2023-02-05 ENCOUNTER — Inpatient Hospital Stay (HOSPITAL_COMMUNITY): Payer: Commercial Managed Care - HMO

## 2023-02-05 DIAGNOSIS — J13 Pneumonia due to Streptococcus pneumoniae: Secondary | ICD-10-CM | POA: Diagnosis not present

## 2023-02-05 DIAGNOSIS — A403 Sepsis due to Streptococcus pneumoniae: Secondary | ICD-10-CM | POA: Diagnosis not present

## 2023-02-05 DIAGNOSIS — Z5900 Homelessness unspecified: Secondary | ICD-10-CM | POA: Diagnosis not present

## 2023-02-05 DIAGNOSIS — F1020 Alcohol dependence, uncomplicated: Secondary | ICD-10-CM | POA: Diagnosis not present

## 2023-02-05 DIAGNOSIS — B953 Streptococcus pneumoniae as the cause of diseases classified elsewhere: Secondary | ICD-10-CM | POA: Diagnosis not present

## 2023-02-05 DIAGNOSIS — E440 Moderate protein-calorie malnutrition: Secondary | ICD-10-CM | POA: Insufficient documentation

## 2023-02-05 DIAGNOSIS — B20 Human immunodeficiency virus [HIV] disease: Secondary | ICD-10-CM | POA: Diagnosis not present

## 2023-02-05 DIAGNOSIS — B182 Chronic viral hepatitis C: Secondary | ICD-10-CM | POA: Diagnosis not present

## 2023-02-05 LAB — BASIC METABOLIC PANEL
Anion gap: 9 (ref 5–15)
BUN: 16 mg/dL (ref 6–20)
CO2: 15 mmol/L — ABNORMAL LOW (ref 22–32)
Calcium: 7.8 mg/dL — ABNORMAL LOW (ref 8.9–10.3)
Chloride: 109 mmol/L (ref 98–111)
Creatinine, Ser: 1.06 mg/dL (ref 0.61–1.24)
GFR, Estimated: >60 mL/min (ref >60)
Glucose, Bld: 85 mg/dL (ref 70–99)
Potassium: 3.8 mmol/L (ref 3.5–5.1)
Sodium: 133 mmol/L — ABNORMAL LOW (ref 135–145)

## 2023-02-05 LAB — HIV-1/2 AB - DIFFERENTIATION
HIV 1 Ab: REACTIVE — AB
HIV 2 Ab: UNDETERMINED

## 2023-02-05 LAB — CBC
HCT: 38.9 % — ABNORMAL LOW (ref 39.0–52.0)
Hemoglobin: 13.3 g/dL (ref 13.0–17.0)
MCH: 34 pg (ref 26.0–34.0)
MCHC: 34.2 g/dL (ref 30.0–36.0)
MCV: 99.5 fL (ref 80.0–100.0)
Platelets: 43 10*3/uL — ABNORMAL LOW (ref 150–400)
RBC: 3.91 MIL/uL — ABNORMAL LOW (ref 4.22–5.81)
RDW: 13.6 % (ref 11.5–15.5)
WBC: 3.6 10*3/uL — ABNORMAL LOW (ref 4.0–10.5)
nRBC: 0 % (ref 0.0–0.2)

## 2023-02-05 LAB — HEMATOLOGY COMMENTS:

## 2023-02-05 LAB — HCV RT-PCR, QUANT (NON-GRAPH)

## 2023-02-05 LAB — LIPID PANEL
Cholesterol: 120 mg/dL (ref 0–200)
HDL: 14 mg/dL — ABNORMAL LOW (ref >40)
LDL Cholesterol: 79 mg/dL (ref 0–99)
Total CHOL/HDL Ratio: 8.6 {ratio}
Triglycerides: 136 mg/dL (ref <150)
VLDL: 27 mg/dL (ref 0–40)

## 2023-02-05 LAB — HCV RNA (INTERNATIONAL UNITS)
HCV RNA (International Units): 19300000 [IU]/mL
HCV log10: 7.286 {Log_IU}/mL

## 2023-02-05 LAB — HIV-1 RNA QUANT-NO REFLEX-BLD
HIV 1 RNA Quant: 492000 {copies}/mL
LOG10 HIV-1 RNA: 5.692 {Log_copies}/mL

## 2023-02-05 LAB — HEPATITIS B SURFACE ANTIBODY, QUANTITATIVE: Hep B S AB Quant (Post): <3.5 m[IU]/mL — ABNORMAL LOW

## 2023-02-05 LAB — GLUCOSE 6 PHOSPHATE DEHYDROGENASE
G6PDH: 10.2 U/g{Hb} (ref 5.5–14.2)
Hemoglobin: 13.4 g/dL (ref 13.0–17.7)

## 2023-02-05 LAB — T.PALLIDUM AB, TOTAL: T Pallidum Abs: NONREACTIVE

## 2023-02-05 LAB — MAGNESIUM: Magnesium: 2 mg/dL (ref 1.7–2.4)

## 2023-02-05 LAB — HCV AB W REFLEX TO QUANT PCR: HCV Ab: REACTIVE — AB

## 2023-02-05 MED ORDER — PYRAZINAMIDE 500 MG PO TABS
1500.0000 mg | ORAL_TABLET | Freq: Every day | ORAL | Status: DC
  Filled 2023-02-05: qty 3

## 2023-02-05 MED ORDER — ROSUVASTATIN CALCIUM 5 MG PO TABS
5.0000 mg | ORAL_TABLET | Freq: Every day | ORAL | Status: DC
Start: 2023-02-05 — End: 2023-04-04
  Administered 2023-02-05 – 2023-04-03 (×57): 5 mg via ORAL
  Filled 2023-02-05 (×58): qty 1

## 2023-02-05 MED ORDER — SULFAMETHOXAZOLE-TRIMETHOPRIM 800-160 MG PO TABS: 1.0000 | ORAL_TABLET | Freq: Once | ORAL | Status: DC

## 2023-02-05 MED ORDER — GADOBUTROL 1 MMOL/ML IV SOLN
5.0000 mL | Freq: Once | INTRAVENOUS | Status: AC | PRN
  Administered 2023-02-05: 5 mL via INTRAVENOUS

## 2023-02-05 MED ORDER — ISONIAZID 300 MG PO TABS
300.0000 mg | ORAL_TABLET | Freq: Every day | ORAL | Status: DC
  Filled 2023-02-05: qty 1

## 2023-02-05 MED ORDER — SULFAMETHOXAZOLE-TRIMETHOPRIM 800-160 MG PO TABS
1.0000 | ORAL_TABLET | Freq: Every day | ORAL | Status: DC
  Administered 2023-02-05 – 2023-04-03 (×57): 1 via ORAL
  Filled 2023-02-05 (×59): qty 1

## 2023-02-05 MED ORDER — ETHAMBUTOL HCL 400 MG PO TABS
1200.0000 mg | ORAL_TABLET | Freq: Every day | ORAL | Status: DC
  Filled 2023-02-05: qty 3

## 2023-02-05 MED ORDER — ENSURE ENLIVE PO LIQD
237.0000 mL | Freq: Two times a day (BID) | ORAL | Status: DC
  Administered 2023-02-05 – 2023-04-03 (×83): 237 mL via ORAL

## 2023-02-05 MED ORDER — LORAZEPAM 0.5 MG PO TABS
0.5000 mg | ORAL_TABLET | Freq: Four times a day (QID) | ORAL | Status: AC | PRN
  Administered 2023-02-05 – 2023-02-07 (×4): 0.5 mg via ORAL
  Filled 2023-02-05 (×4): qty 1

## 2023-02-05 MED ORDER — RIFAMPIN 300 MG PO CAPS
10.0000 mg/kg/d | ORAL_CAPSULE | Freq: Every day | ORAL | Status: DC
  Filled 2023-02-05: qty 2

## 2023-02-05 NOTE — Progress Notes (Signed)
  Progress Note   Date: 02/05/2023  Patient Name: Russell [name] DOBOSZ        MRN#: [id number]   I was asked to clarify the significance of calcium  of 7.6,  this is actually normal as his albumin  is 2.2.  There is no hypocalcemia, corrected calcium  is 9  [place], Maciah Schweigert C, DO

## 2023-02-05 NOTE — Progress Notes (Signed)
 Initial Nutrition Assessment  DOCUMENTATION CODES:   Non-severe (moderate) malnutrition in context of chronic illness  INTERVENTION:  Continue regular diet Ensure Plus High Protein po BID, each supplement provides 350 kcal and 20 grams of protein.    NUTRITION DIAGNOSIS:   Moderate Malnutrition related to chronic illness as evidenced by estimated needs, moderate fat depletion, moderate muscle depletion.    GOAL:   Patient will meet greater than or equal to 90% of their needs    MONITOR:   PO intake, Supplement acceptance  REASON FOR ASSESSMENT:   Malnutrition Screening Tool    ASSESSMENT:  58 y.o. M, presented to ED for evaluation of altered mental status. Admitted with Pneumococcal bacteremia. PMH; remote history of ETOH and cocaine abuse. Patient non verbal at time of visit.  Lunch tray has not been touched.  RN reports 100% of breakfast consumed. Pt poor nutrition historian. Able to feed self. Suspected HIV, continues to be confused.    Admit weight: 57.8 kg     Average Meal Intake: 100% x 1 meal (per NT)  Nutritionally Relevant Medications: Scheduled Meds:  folic acid   1 mg Oral Daily   multivitamin with minerals  1 tablet Oral Daily   sulfamethoxazole -trimethoprim   1 tablet Oral Daily   thiamine   100 mg Oral Daily   Or   thiamine   100 mg Intravenous Daily    PRN Meds:.LORazepam , sodium chloride  flush  Labs Reviewed    NUTRITION - FOCUSED PHYSICAL EXAM:  Flowsheet Row Most Recent Value  Orbital Region Moderate depletion  Upper Arm Region Moderate depletion  Thoracic and Lumbar Region Moderate depletion  Buccal Region Mild depletion  Temple Region Mild depletion  Clavicle Bone Region Moderate depletion  Clavicle and Acromion Bone Region Moderate depletion  Scapular Bone Region No depletion  Dorsal Hand Unable to assess  Anterior Thigh Region Mild depletion  Posterior Calf Region Mild depletion  Edema (RD Assessment) None  Hair Reviewed   Eyes Reviewed  Mouth Reviewed  Skin Reviewed  Nails Reviewed       Diet Order:   Diet Order             Diet regular Room service appropriate? No; Fluid consistency: Thin  Diet effective now                   EDUCATION NEEDS:   Not appropriate for education at this time  Skin:  Skin Assessment: Reviewed RN Assessment  Last BM:  1/9 type 6  Height:   Ht Readings from Last 1 Encounters:  02/03/23 5' 6 (1.676 m)    Weight:   Wt Readings from Last 1 Encounters:  02/03/23 57.8 kg    Ideal Body Weight:     BMI:  Body mass index is 20.57 kg/m.  Estimated Nutritional Needs:   Kcal:  1750-2050 kcal  Protein:  75-90 g  Fluid:  78ml/kcal    Jenna Pew RDN, LDN Clinical Dietitian   If unable to reach, please contact RD Inpatient secure chat group between 8 am-4 pm daily

## 2023-02-05 NOTE — Evaluation (Signed)
 Physical Therapy Evaluation Patient Details Name: Russell [name] MRN: [id number] DOB: 07-24-1965 Today's Date: 02/05/2023  History of Present Illness  Pt is a 58 yo male who presented to [facility] ED on 02/03/23 with a chief complaint of fall and AMS, being worked up for sepsis possibly due to pneumonia. PMH remote cocaine use and alcohol use.  Clinical Impression  Pt admitted with above diagnosis. Limited by confusion, restlessness. Required max assist for bed mobility and partial stand. Internally distracted, required multimodal cues throughout. Patient will benefit from continued inpatient follow up therapy, <3 hours/day.  Pt currently with functional limitations due to the deficits listed below (see PT Problem List). Pt will benefit from acute skilled PT to increase their independence and safety with mobility to allow discharge.           If plan is discharge home, recommend the following: A lot of help with bathing/dressing/bathroom;A lot of help with walking and/or transfers;Assistance with cooking/housework;Direct supervision/assist for medications management;Direct supervision/assist for financial management;Assist for transportation;Help with stairs or ramp for entrance;Supervision due to cognitive status   Can travel by private vehicle   No    Equipment Recommendations Other (comment) (TBD next venue)  Recommendations for Other Services       Functional Status Assessment Patient has had a recent decline in their functional status and demonstrates the ability to make significant improvements in function in a reasonable and predictable amount of time.     Precautions / Restrictions Precautions Precautions: Fall Restrictions Weight Bearing Restrictions Per Provider Order: No      Mobility  Bed Mobility Overal bed mobility: Needs Assistance Bed Mobility: Supine to Sit, Sit to Supine     Supine to sit: Max assist Sit to supine: Max assist   General bed mobility comments:  Multimodal cues to facilitate. Somewhat restless and moving in non-productive ways in bed. Difficulty following commands without direct tactile guidance.    Transfers Overall transfer level: Needs assistance Equipment used: 1 person hand held assist Transfers: Sit to/from Stand Sit to Stand: Max assist           General transfer comment: Max assist to facilitate rise, only partially stands then sits back down. Difficulty remaining focused on task at hand and is restless.    Ambulation/Gait               General Gait Details: deferred due to safety concerns  Stairs            Wheelchair Mobility     Tilt Bed    Modified Rankin (Stroke Patients Only)       Balance Overall balance assessment: Needs assistance Sitting-balance support: Feet unsupported, No upper extremity supported Sitting balance-Leahy Scale: Fair                                       Pertinent Vitals/Pain Pain Assessment Pain Assessment: No/denies pain    Home Living Family/patient expects to be discharged to:: Private residence Living Arrangements: Other relatives;Other (Comment) (sister) Available Help at Discharge: Family;Available PRN/intermittently Type of Home: House             Additional Comments: Unreliable responses    Prior Function Prior Level of Function : Patient poor historian/Family not available             Mobility Comments: States he uses SPC at times.       Extremity/Trunk  Assessment   Upper Extremity Assessment Upper Extremity Assessment: Defer to OT evaluation    Lower Extremity Assessment Lower Extremity Assessment: Difficult to assess due to impaired cognition       Communication   Communication Communication: No apparent difficulties Cueing Techniques: Verbal cues;Tactile cues  Cognition Arousal: Alert Behavior During Therapy: Restless Overall Cognitive Status: No family/caregiver present to determine baseline  cognitive functioning                                 General Comments: Does not answer orientation questions. responds to about 20% of questions regarding hx and PLOF. States he lives with sister and they take care of each other. Agreeable to work with PT but unable to follow commands, requires max multimodal cues to facilitate movement.        General Comments      Exercises     Assessment/Plan    PT Assessment Patient needs continued PT services  PT Problem List Decreased strength;Decreased range of motion;Decreased activity tolerance;Decreased balance;Decreased mobility;Decreased coordination;Decreased cognition;Decreased knowledge of use of DME;Decreased safety awareness;Decreased knowledge of precautions       PT Treatment Interventions DME instruction;Gait training;Functional mobility training;Therapeutic exercise;Therapeutic activities;Balance training;Neuromuscular re-education;Cognitive remediation;Patient/family education    PT Goals (Current goals can be found in the Care Plan section)  Acute Rehab PT Goals Patient Stated Goal: none stated PT Goal Formulation: Patient unable to participate in goal setting Time For Goal Achievement: 02/19/23 Potential to Achieve Goals: Fair    Frequency Min 1X/week     Co-evaluation               AM-PAC PT 6 Clicks Mobility  Outcome Measure Help needed turning from your back to your side while in a flat bed without using bedrails?: A Lot Help needed moving from lying on your back to sitting on the side of a flat bed without using bedrails?: A Lot Help needed moving to and from a bed to a chair (including a wheelchair)?: A Lot Help needed standing up from a chair using your arms (e.g., wheelchair or bedside chair)?: Total Help needed to walk in hospital room?: Total Help needed climbing 3-5 steps with a railing? : Total 6 Click Score: 9    End of Session Equipment Utilized During Treatment: Gait  belt Activity Tolerance: Other (comment);Treatment limited secondary to medical complications (Comment) (confusion) Patient left: in bed;with call bell/phone within reach;with bed alarm set (mat in place)   PT Visit Diagnosis: Unsteadiness on feet (R26.81);Muscle weakness (generalized) (M62.81);Difficulty in walking, not elsewhere classified (R26.2);Other symptoms and signs involving the nervous system (R29.898)    Time: 8447-8392 PT Time Calculation (min) (ACUTE ONLY): 15 min   Charges:   PT Evaluation $PT Eval Moderate Complexity: 1 Mod   PT General Charges $$ ACUTE PT VISIT: 1 Visit         Leontine Roads, PT, DPT [facility]  Rehabilitation Services Physical Therapist Office: [phone] Website: [email or web address]   Leontine GORMAN Roads 02/05/2023, 4:42 PM

## 2023-02-05 NOTE — Plan of Care (Addendum)
 0920: Pt more awake, and confused pt stating  I want to leave and go home. RN tried to re-orient pt but pt not understanding he is at the hospital being treated. Pt constantly pulling off telemetry leads and refusing wear them. Pt sometimes refusing to stay in bed. Pt very weak and can barely stand without assistance. RN notified Dr.Hoffman and team. Per MD okay to leave off telemetry for now. MD's came to speak with pt about plan. Pt to stay in hospital. Pt ordered for MRI. Pt took some morning meds but then tried to refuse other medications.

## 2023-02-05 NOTE — Progress Notes (Signed)
 PT Cancellation Note  Patient Details Name: Russell [name] MRN: [id number] DOB: Apr 11, 1965   Cancelled Treatment:    Reason Eval/Treat Not Completed: Other (comment)  Medical team speaking with pt at the moment. Reportedly attempting to leave AMA. Will check back for PT assessment this afternoon as schedule permits.  Leontine Roads, PT, DPT [facility]  Rehabilitation Services Physical Therapist Office: [phone] Website: [email or web address]  Leontine GORMAN Roads 02/05/2023, 10:11 AM
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
## 2023-02-05 NOTE — Progress Notes (Signed)
 Mobility Specialist Progress Note:    02/05/23 1009  Mobility  Activity Stood at bedside  Level of Assistance Moderate assist, patient does 50-74%  Assistive Device Other (Comment) (HHA)  Distance Ambulated (ft) 2 ft  Activity Response Tolerated well  Mobility Referral Yes  Mobility visit 1 Mobility  Mobility Specialist Start Time (ACUTE ONLY) 0940  Mobility Specialist Stop Time (ACUTE ONLY) 1009  Mobility Specialist Time Calculation (min) (ACUTE ONLY) 29 min   Responded to bed alarm, requesting to leave AMA. Received sitting EOB covered in stool, assisted NT with peri care. RN and MD aware. Pt able to stand at bedside x2 with ModA for clean up and linen change. Unable to take steps back to bed, un braked bed to bring closer to pt eager to sit. Left pt in care of RN and MD, all needs met.   Naureen Benton Mobility Specialist Please contact via Special Educational Needs Teacher or  Rehab office at [phone]

## 2023-02-05 NOTE — Progress Notes (Signed)
 [facility]  Date of Admission:  02/03/2023     Total days of antibiotics 3         ASSESSMENT:  Mr. Cuffe CD4 count returned at 116 with remaining HIV and Syphilis confirmatory testing pending in the setting of Streptococcal pneumoniae pneumonia and bacteremia. Continue current dose of ceftiraxone for Streptococcus pneumoniae infection and Biktarvy  with Bactrim  for suspected HIV. Confused on exam and will continue to try to educate. Question underlying ability to make medical decisions and provide care for himself versus acute change. Remaining medical and supportive care per Internal Medicine.   PLAN:  Continue current dose of ceftriaxone . Continue current dose of Biktarvy  and Bactrim .  Monitor confirmatory testing for HIV and Syphilis. Remaining medical and supportive care per Internal Medicine.   Principal Problem:   Pneumococcal bacteremia Active Problems:   Sepsis (HCC)   Fall   Encephalopathy   Community acquired pneumonia   Thrombocytopenia (HCC)   Hematuria   Homelessness   Alcoholism (HCC)   AIDS (acquired immune deficiency syndrome) (HCC)   Positive RPR test   Cholelithiasis without cholecystitis   Aortic atherosclerosis (HCC)   Cerebral atrophy (HCC)   Horseshoe kidney    bictegravir-emtricitabine -tenofovir  AF  1 tablet Oral Daily   folic acid   1 mg Oral Daily   multivitamin with minerals  1 tablet Oral Daily   rosuvastatin   5 mg Oral Daily   sodium chloride  flush  3 mL Intravenous Q12H   sulfamethoxazole -trimethoprim   1 tablet Oral Daily   thiamine   100 mg Oral Daily   Or   thiamine   100 mg Intravenous Daily    SUBJECTIVE:  Afebrile overnight with no acute events. Confused.   No Known Allergies   Review of Systems: Review of Systems  Unable to perform ROS: Mental status change      OBJECTIVE: Vitals:   02/04/23 1400 02/04/23 1839 02/04/23 2032 02/05/23 0752  BP: 120/83 113/74 104/78 126/87  Pulse: 85 87 87  78  Resp: 17 18 17 19   Temp: (!) 96.5 F (35.8 C) 98.1 F (36.7 C) (!) 97.5 F (36.4 C) 97.8 F (36.6 C)  TempSrc: Axillary Axillary Axillary Oral  SpO2: 99% 97% 98% 100%  Weight:      Height:       Body mass index is 20.57 kg/m.  Physical Exam Constitutional:      General: He is not in acute distress.    Appearance: He is well-developed. He is ill-appearing.  Cardiovascular:     Rate and Rhythm: Normal rate and regular rhythm.     Heart sounds: Normal heart sounds.  Pulmonary:     Effort: Pulmonary effort is normal.     Breath sounds: Rhonchi present.  Skin:    General: Skin is warm and dry.  Neurological:     Mental Status: He is disoriented.     Lab Results Lab Results  Component Value Date   WBC 8.9 02/04/2023   HGB 13.1 02/04/2023   HCT 38.2 (L) 02/04/2023   MCV 99.0 02/04/2023   PLT 46 (L) 02/04/2023    Lab Results  Component Value Date   CREATININE 1.38 (H) 02/04/2023   BUN 18 02/04/2023   NA 133 (L) 02/04/2023   K 3.7 02/04/2023   CL 108 02/04/2023   CO2 18 (L) 02/04/2023    Lab Results  Component Value Date   ALT 24 02/03/2023   AST 85 (H) 02/03/2023   ALKPHOS 80 02/03/2023  BILITOT 2.9 (H) 02/03/2023     Microbiology: Recent Results (from the past 240 hours)  Blood culture (routine x 2)     Status: None (Preliminary result)   Collection Time: 02/03/23 10:40 AM   Specimen: BLOOD LEFT FOREARM  Result Value Ref Range Status   Specimen Description BLOOD LEFT FOREARM  Final   Special Requests   Final    BOTTLES DRAWN AEROBIC AND ANAEROBIC Blood Culture adequate volume   Culture   Final    NO GROWTH 2 DAYS Performed at [facility], [address] [place], [place] [address]    Report Status PENDING  Incomplete  Blood culture (routine x 2)     Status: Abnormal (Preliminary result)   Collection Time: 02/03/23 10:45 AM   Specimen: BLOOD  Result Value Ref Range Status   Specimen Description BLOOD RIGHT ANTECUBITAL  Final   Special  Requests   Final    BOTTLES DRAWN AEROBIC AND ANAEROBIC Blood Culture results may not be optimal due to an inadequate volume of blood received in culture bottles   Culture  Setup Time   Final    GRAM POSITIVE COCCI IN PAIRS AEROBIC BOTTLE ONLY CRITICAL RESULT CALLED TO, READ BACK BY AND VERIFIED WITH: V BRYK,PHARMD@0420  02/04/23 MK    Culture (A)  Final    STREPTOCOCCUS PNEUMONIAE SUSCEPTIBILITIES TO FOLLOW Performed at [facility], [address] [place], [place] [address]    Report Status PENDING  Incomplete  Blood Culture ID Panel (Reflexed)     Status: Abnormal   Collection Time: 02/03/23 10:45 AM  Result Value Ref Range Status   Enterococcus faecalis NOT DETECTED NOT DETECTED Final   Enterococcus Faecium NOT DETECTED NOT DETECTED Final   Listeria monocytogenes NOT DETECTED NOT DETECTED Final   Staphylococcus species NOT DETECTED NOT DETECTED Final   Staphylococcus aureus (BCID) NOT DETECTED NOT DETECTED Final   Staphylococcus epidermidis NOT DETECTED NOT DETECTED Final   Staphylococcus lugdunensis NOT DETECTED NOT DETECTED Final   Streptococcus species DETECTED (A) NOT DETECTED Final    Comment: CRITICAL RESULT CALLED TO, READ BACK BY AND VERIFIED WITH: V BRYK,PHARMD@0420  02/04/23 MK    Streptococcus agalactiae NOT DETECTED NOT DETECTED Final   Streptococcus pneumoniae DETECTED (A) NOT DETECTED Final    Comment: CRITICAL RESULT CALLED TO, READ BACK BY AND VERIFIED WITH: V BRYK,PHARMD@0420  02/04/23 MK    Streptococcus pyogenes NOT DETECTED NOT DETECTED Final   A.calcoaceticus-baumannii NOT DETECTED NOT DETECTED Final   Bacteroides fragilis NOT DETECTED NOT DETECTED Final   Enterobacterales NOT DETECTED NOT DETECTED Final   Enterobacter cloacae complex NOT DETECTED NOT DETECTED Final   Escherichia coli NOT DETECTED NOT DETECTED Final   Klebsiella aerogenes NOT DETECTED NOT DETECTED Final   Klebsiella oxytoca NOT DETECTED NOT DETECTED Final   Klebsiella pneumoniae  NOT DETECTED NOT DETECTED Final   Proteus species NOT DETECTED NOT DETECTED Final   Salmonella species NOT DETECTED NOT DETECTED Final   Serratia marcescens NOT DETECTED NOT DETECTED Final   Haemophilus influenzae NOT DETECTED NOT DETECTED Final   Neisseria meningitidis NOT DETECTED NOT DETECTED Final   Pseudomonas aeruginosa NOT DETECTED NOT DETECTED Final   Stenotrophomonas maltophilia NOT DETECTED NOT DETECTED Final   Candida albicans NOT DETECTED NOT DETECTED Final   Candida auris NOT DETECTED NOT DETECTED Final   Candida glabrata NOT DETECTED NOT DETECTED Final   Candida krusei NOT DETECTED NOT DETECTED Final   Candida parapsilosis NOT DETECTED NOT DETECTED Final  Candida tropicalis NOT DETECTED NOT DETECTED Final   Cryptococcus neoformans/gattii NOT DETECTED NOT DETECTED Final    Comment: Performed at [facility], [address], [place], [place] [address]     Cathlyn July, NP [facility]  02/05/2023  11:19 AM

## 2023-02-06 ENCOUNTER — Inpatient Hospital Stay (HOSPITAL_COMMUNITY): Payer: Commercial Managed Care - HMO

## 2023-02-06 DIAGNOSIS — F1020 Alcohol dependence, uncomplicated: Secondary | ICD-10-CM | POA: Diagnosis not present

## 2023-02-06 DIAGNOSIS — I5189 Other ill-defined heart diseases: Secondary | ICD-10-CM

## 2023-02-06 DIAGNOSIS — A403 Sepsis due to Streptococcus pneumoniae: Secondary | ICD-10-CM | POA: Diagnosis not present

## 2023-02-06 DIAGNOSIS — I272 Pulmonary hypertension, unspecified: Secondary | ICD-10-CM | POA: Insufficient documentation

## 2023-02-06 DIAGNOSIS — B1920 Unspecified viral hepatitis C without hepatic coma: Secondary | ICD-10-CM

## 2023-02-06 DIAGNOSIS — R7881 Bacteremia: Secondary | ICD-10-CM

## 2023-02-06 DIAGNOSIS — R768 Other specified abnormal immunological findings in serum: Secondary | ICD-10-CM

## 2023-02-06 DIAGNOSIS — B182 Chronic viral hepatitis C: Secondary | ICD-10-CM | POA: Diagnosis not present

## 2023-02-06 DIAGNOSIS — B20 Human immunodeficiency virus [HIV] disease: Secondary | ICD-10-CM | POA: Diagnosis not present

## 2023-02-06 DIAGNOSIS — G9340 Encephalopathy, unspecified: Secondary | ICD-10-CM | POA: Diagnosis not present

## 2023-02-06 DIAGNOSIS — B953 Streptococcus pneumoniae as the cause of diseases classified elsewhere: Secondary | ICD-10-CM | POA: Diagnosis not present

## 2023-02-06 DIAGNOSIS — J13 Pneumonia due to Streptococcus pneumoniae: Secondary | ICD-10-CM | POA: Diagnosis not present

## 2023-02-06 DIAGNOSIS — Z5900 Homelessness unspecified: Secondary | ICD-10-CM | POA: Diagnosis not present

## 2023-02-06 LAB — CULTURE, BLOOD (ROUTINE X 2)

## 2023-02-06 LAB — ECHOCARDIOGRAM COMPLETE
Area-P 1/2: 4.49 cm2
Est EF: 55
Height: 66 in
S' Lateral: 1.7 cm
Single Plane A4C EF: 53.5 %
Weight: 2038.81 [oz_av]

## 2023-02-06 LAB — LEGIONELLA PNEUMOPHILA SEROGP 1 UR AG: L. pneumophila Serogp 1 Ur Ag: NEGATIVE

## 2023-02-06 MED ORDER — PENICILLIN G POTASSIUM 20000000 UNITS IJ SOLR: 12.0000 10*6.[IU] | Freq: Two times a day (BID) | INTRAVENOUS | Status: DC

## 2023-02-06 MED ORDER — SODIUM CHLORIDE 0.9 % IV SOLN
12.0000 10*6.[IU] | Freq: Two times a day (BID) | INTRAVENOUS | Status: DC
  Administered 2023-02-07 – 2023-02-09 (×5): 12 10*6.[IU] via INTRAVENOUS
  Filled 2023-02-06 (×6): qty 12

## 2023-02-06 NOTE — Consult Note (Signed)
 The patient was referred for evaluation of delirium versus an underlying psychiatric condition, and an infectious disease consult was requested for capacity/competency assessment. Upon assessment, it was immediately clear that the patient was confused and disoriented, and he lacks the capacity to make medical decisions. The patient is alert and oriented to himself but becomes easily frustrated. However, he does not show heightened aggression, agitation, or threatening behaviors. When asked about his reasons for wanting to leave the hospital, he repeatedly states, I just want to go home, citing reasons such as being tired of the hospital, wanting to take care of his mother, and the cold environment. Despite attempts to explain that he is very ill and may need to stay in the hospital, he denies being sick and does not see a reason to remain in the hospital.  While confused and disoriented, the patient does not appear to be psychotic. He denies experiencing any hallucinations, though it's unclear if he fully understands the concept of hallucinations. When asked, he responded, I ain't got that. The patient is a poor historian and does not display any acute psychiatric symptoms or behaviors consistent with delirium that would warrant further psychiatric monitoring. Although it is likely that an infectious process related to his multiple medical diagnoses is contributing to his current state, it is still undetermined whether an underlying psychiatric condition exists. Given his moderate cognitive impairment, as evidenced by his MRI, neuropsychiatric conditions such as HIV-related dementia, AIDS-related neurocognitive disorders (HAND), or other HIV-associated psychiatric disorders should be considered in the differential diagnosis.  A referral to neuropsychology is recommended for further evaluation. At this time, unless the patient becomes aggressive or displays psychotic symptoms, I would not recommend starting  an antipsychotic medication. However, it may be appropriate to initiate Seroquel  25 mg PO qhs prn if needed for sleep or anxiety. Medical evaluation should be completed before further psychiatric treatment decisions are made.  Psych consult was placed for mental capacity, as patient insight and judgement continue to remain the fairly poor throughout this hospital admission. In terms of decision making capacity, patient lacks insight, appreciation, understanding, rationalization, communication, consequences, and is unable to link causal relationships. This is evident by his inability to weigh his current physical limitations, cognitive limitation and need for medication management. Therefore he lacks capacity as he is unable to appreciate the effects of treatment and his need for ongoing services.    Decision being assessed:make medical decisions ability to discharge In an evaluation of capacity, each of the following criteria must be met based for a patient to have capacity to make the decision in question.   Criterion 1: The patient demonstrates a clear and consistent voluntary choice with regard to treatment options. No Criterion 2: The patient adequately understands the disease they have, the treatment proposed, the risks of treatment, and the risks of other treatment (including no treatment). No Criterion 3: The patient acknowledges that the details of Criterion 2 apply to them specifically and the likely consequences of treatment options proposed. No Criterion 4: The patient demonstrates adequate reasoning/rationality within the context of their decision and can provide justification for their choice. No  In this case, the patient Russell [name]  DOES NOT have capacity to decide to discharge.   Of note, this capacity evaluation assesses only for the specified decision documented above at the time of the assessment and is not a substitute for determination of the patient's overall competency, which  can only be adjudicated.   Conclusion: At this time,  there is sufficient evidence to warrant removal of the patient's rights for medical decision-making as it pertains to discharging home.  He can NOT clearly determine mental capacity for decision-making unsure if this is secondary to neurocognitive impairment and or pre-existing cognitive delay.  At this time, we can determine that the patient does NOT have functional mental capacity for medical decision-making including the right to discharge home with no safe disposition.     A substitute decision maker must be sought to authorize medical intervention or to refuse treatment on behalf of the patient; for patients with advance directives, either the treatment choice that the patient made in advance or the choice of a surrogate decision maker may be indicated. In the absence of an advance directive and when time is available, the recommendation is usually to contact family members (the priority order to be approached is the spouse, adult children, parents, siblings, and other relatives).  Surrogate decision making for consent to make medical decisions for patient: The medical team has well-founded concerns regarding Russell [name] capacity to provide consent for treatment.   The medical team, together with the social work team, should by good faith attempt to find and contact any family or friends who may be able to make decisions on this patient's behalf.  While legal guardianship is ideal for the long-term benefit of a patient in this situation, there is no appointed guardian for [name]  yet.   It is important to note that decision-making capacity is assessed for a specific decision at a specific moment in time. The reason for this is different decisions have different risks and benefits. The threshold for an individual to display adequate decision-making capacity to provide consent to or refuse treatment should reflect the risks and benefits  of that specific decision. Furthermore, decision-making capacity is time sensitive meaning if an individual does not have the capacity to make a decision one day, this does not mean that they will not have the capacity to make the same medical decision at a later time.   -Consider SLP for [facility] evaluation.  -Recommend avoiding psychotropic medications unless he develops acute psychosis and or aggression.  -Recommend neuropsychiatric referral -TOC referral for next of kin or consider APS for legal guardian.  -Patient does not meet criteria for inpatient psychiatric evaluation. Due to his lack of capacity in the event he attempts to leave he can be placed under IVC by his primary team.  -Psychiatry consult service to sign off at this time.  Thank you for this capacity consult.

## 2023-02-06 NOTE — Plan of Care (Signed)

## 2023-02-06 NOTE — Progress Notes (Addendum)
 Subjective: No new complaints   Antibiotics:  Anti-infectives (From admission, onward)    Start     Dose/Rate Route Frequency Ordered Stop   02/07/23 1000  penicillin  G potassium 12 Million Units in dextrose  5 % 500 mL CONTINUOUS infusion  Status:  Discontinued        12 Million Units 41.7 mL/hr over 12 Hours Intravenous Every 12 hours 02/06/23 1038 02/06/23 1041   02/07/23 1000  penicillin  G potassium 12 Million Units in sodium chloride  0.9 % 500 mL CONTINUOUS infusion        12 Million Units 41.7 mL/hr over 12 Hours Intravenous Every 12 hours 02/06/23 1041     02/05/23 1230  rifampin  (RIFADIN ) capsule 600 mg  Status:  Discontinued       Placed in And Linked Group   10 mg/kg/day  57.8 kg Oral Daily 02/05/23 1134 02/05/23 1136   02/05/23 1230  isoniazid  (NYDRAZID ) tablet 300 mg  Status:  Discontinued       Placed in And Linked Group   300 mg Oral Daily 02/05/23 1134 02/05/23 1136   02/05/23 1230  pyrazinamide  tablet 1,500 mg  Status:  Discontinued       Placed in And Linked Group   1,500 mg Oral Daily 02/05/23 1134 02/05/23 1136   02/05/23 1230  ethambutol  (MYAMBUTOL ) tablet 1,200 mg  Status:  Discontinued       Placed in And Linked Group   1,200 mg Oral Daily 02/05/23 1134 02/05/23 1136   02/05/23 1000  sulfamethoxazole -trimethoprim  (BACTRIM  DS) 800-160 MG per tablet 1 tablet        1 tablet Oral Daily 02/05/23 0716     02/05/23 0815  sulfamethoxazole -trimethoprim  (BACTRIM  DS) 800-160 MG per tablet 1 tablet  Status:  Discontinued        1 tablet Oral  Once 02/05/23 0716 02/05/23 0716   02/04/23 1200  vancomycin  (VANCOCIN ) IVPB 1000 mg/200 mL premix  Status:  Discontinued        1,000 mg 200 mL/hr over 60 Minutes Intravenous Every 24 hours 02/03/23 1838 02/04/23 0853   02/04/23 1000  bictegravir-emtricitabine -tenofovir  AF (BIKTARVY ) 50-200-25 MG per tablet 1 tablet        1 tablet Oral Daily 02/04/23 0927     02/04/23 0900  cefTRIAXone  (ROCEPHIN ) 2 g in  sodium chloride  0.9 % 100 mL IVPB  Status:  Discontinued        2 g 200 mL/hr over 30 Minutes Intravenous Every 24 hours 02/04/23 0853 02/06/23 1038   02/04/23 0000  ceFEPIme  (MAXIPIME ) 2 g in sodium chloride  0.9 % 100 mL IVPB  Status:  Discontinued        2 g 200 mL/hr over 30 Minutes Intravenous Every 12 hours 02/03/23 1838 02/04/23 0853   02/03/23 1045  Vancomycin  (VANCOCIN ) 1,500 mg in sodium chloride  0.9 % 500 mL IVPB        1,500 mg 250 mL/hr over 120 Minutes Intravenous  Once 02/03/23 1042 02/03/23 1401   02/03/23 1045  ceFEPIme  (MAXIPIME ) 2 g in sodium chloride  0.9 % 100 mL IVPB        2 g 200 mL/hr over 30 Minutes Intravenous  Once 02/03/23 1042 02/03/23 1246       Medications: Scheduled Meds:  bictegravir-emtricitabine -tenofovir  AF  1 tablet Oral Daily   feeding supplement  237 mL Oral BID BM   folic acid   1 mg Oral Daily   multivitamin with minerals  1 tablet Oral  Daily   rosuvastatin   5 mg Oral Daily   sodium chloride  flush  3 mL Intravenous Q12H   sulfamethoxazole -trimethoprim   1 tablet Oral Daily   thiamine   100 mg Oral Daily   Or   thiamine   100 mg Intravenous Daily   Continuous Infusions:  [START ON 02/07/2023] penicillin  G potassium 12 Million Units in sodium chloride  0.9 % 500 mL CONTINUOUS infusion     PRN Meds:.LORazepam , sodium chloride  flush    Objective: Weight change:   Intake/Output Summary (Last 24 hours) at 02/06/2023 1435 Last data filed at 02/06/2023 1221 Gross per 24 hour  Intake 75874 ml  Output 600 ml  Net 23525 ml   Blood pressure (!) 145/98, pulse 72, temperature 97.6 F (36.4 C), temperature source Oral, resp. rate 18, height 5' 6 (1.676 m), weight 57.8 kg, SpO2 100%. Temp:  [97.6 F (36.4 C)] 97.6 F (36.4 C) (01/10 0537) Pulse Rate:  [72-83] 72 (01/10 0834) Resp:  [18-19] 18 (01/10 0834) BP: (141-145)/(92-98) 145/98 (01/10 0834) SpO2:  [98 %-100 %] 100 % (01/10 0834)  Physical Exam: Physical Exam Constitutional:       Appearance: He is ill-appearing.  Eyes:     General:        Right eye: No discharge.        Left eye: No discharge.     Extraocular Movements: Extraocular movements intact.  Cardiovascular:     Rate and Rhythm: Normal rate and regular rhythm.  Pulmonary:     Effort: Pulmonary effort is normal. No respiratory distress.     Breath sounds: No wheezing.  Abdominal:     General: There is no distension.  Musculoskeletal:        General: Normal range of motion.  Skin:    General: Skin is warm and dry.  Neurological:     General: No focal deficit present.     Mental Status: He is alert.     Comments: To being at [facility] in [place] but does not know the year.  Get give us  his correct date of birth today  Psychiatric:        Mood and Affect: Mood normal.        Cognition and Memory: Memory is impaired. He exhibits impaired recent memory and impaired remote memory.        Judgment: Judgment is impulsive.      CBC:    BMET Recent Labs    02/04/23 0529 02/05/23 1226  NA 133* 133*  K 3.7 3.8  CL 108 109  CO2 18* 15*  GLUCOSE 94 85  BUN 18 16  CREATININE 1.38* 1.06  CALCIUM  7.6* 7.8*     Liver Panel  No results for input(s): PROT, ALBUMIN , AST, ALT, ALKPHOS, BILITOT, BILIDIR, IBILI in the last 72 hours.     Sedimentation Rate No results for input(s): ESRSEDRATE in the last 72 hours. C-Reactive Protein No results for input(s): CRP in the last 72 hours.  Micro Results: Recent Results (from the past 720 hours)  Blood culture (routine x 2)     Status: None (Preliminary result)   Collection Time: 02/03/23 10:40 AM   Specimen: BLOOD LEFT FOREARM  Result Value Ref Range Status   Specimen Description BLOOD LEFT FOREARM  Final   Special Requests   Final    BOTTLES DRAWN AEROBIC AND ANAEROBIC Blood Culture adequate volume   Culture   Final    NO GROWTH 3 DAYS Performed at [facility], [address], [place], [place] [address]     Report Status PENDING  Incomplete  Blood culture (routine x 2)     Status: Abnormal   Collection Time: 02/03/23 10:45 AM   Specimen: BLOOD  Result Value Ref Range Status   Specimen Description BLOOD RIGHT ANTECUBITAL  Final   Special Requests   Final    BOTTLES DRAWN AEROBIC AND ANAEROBIC Blood Culture results may not be optimal due to an inadequate volume of blood received in culture bottles   Culture  Setup Time   Final    GRAM POSITIVE COCCI IN PAIRS AEROBIC BOTTLE ONLY CRITICAL RESULT CALLED TO, READ BACK BY AND VERIFIED WITH: V BRYK,PHARMD@0420  02/04/23 MK Performed at [facility], [address], [place], [place] [address]    Culture STREPTOCOCCUS PNEUMONIAE (A)  Final   Report Status 02/06/2023 FINAL  Final   Organism ID, Bacteria STREPTOCOCCUS PNEUMONIAE  Final      Susceptibility   Streptococcus pneumoniae - MIC*    ERYTHROMYCIN  <=0.12 SENSITIVE Sensitive     LEVOFLOXACIN 1 SENSITIVE Sensitive     VANCOMYCIN  0.5 SENSITIVE Sensitive     PENICILLIN  (meningitis) <=0.06 SENSITIVE Sensitive     PENO - penicillin  <=0.06      PENICILLIN  (non-meningitis) <=0.06 SENSITIVE Sensitive     PENICILLIN  (oral) <=0.06 SENSITIVE Sensitive     CEFTRIAXONE  (non-meningitis) <=0.12 SENSITIVE Sensitive     CEFTRIAXONE  (meningitis) <=0.12 SENSITIVE Sensitive     * STREPTOCOCCUS PNEUMONIAE  Blood Culture ID Panel (Reflexed)     Status: Abnormal   Collection Time: 02/03/23 10:45 AM  Result Value Ref Range Status   Enterococcus faecalis NOT DETECTED NOT DETECTED Final   Enterococcus Faecium NOT DETECTED NOT DETECTED Final   Listeria monocytogenes NOT DETECTED NOT DETECTED Final   Staphylococcus species NOT DETECTED NOT DETECTED Final   Staphylococcus aureus (BCID) NOT DETECTED NOT DETECTED Final   Staphylococcus epidermidis NOT DETECTED NOT DETECTED Final   Staphylococcus lugdunensis NOT DETECTED NOT DETECTED Final   Streptococcus species DETECTED (A) NOT DETECTED Final    Comment:  CRITICAL RESULT CALLED TO, READ BACK BY AND VERIFIED WITH: V BRYK,PHARMD@0420  02/04/23 MK    Streptococcus agalactiae NOT DETECTED NOT DETECTED Final   Streptococcus pneumoniae DETECTED (A) NOT DETECTED Final    Comment: CRITICAL RESULT CALLED TO, READ BACK BY AND VERIFIED WITH: V BRYK,PHARMD@0420  02/04/23 MK    Streptococcus pyogenes NOT DETECTED NOT DETECTED Final   A.calcoaceticus-baumannii NOT DETECTED NOT DETECTED Final   Bacteroides fragilis NOT DETECTED NOT DETECTED Final   Enterobacterales NOT DETECTED NOT DETECTED Final   Enterobacter cloacae complex NOT DETECTED NOT DETECTED Final   Escherichia coli NOT DETECTED NOT DETECTED Final   Klebsiella aerogenes NOT DETECTED NOT DETECTED Final   Klebsiella oxytoca NOT DETECTED NOT DETECTED Final   Klebsiella pneumoniae NOT DETECTED NOT DETECTED Final   Proteus species NOT DETECTED NOT DETECTED Final   Salmonella species NOT DETECTED NOT DETECTED Final   Serratia marcescens NOT DETECTED NOT DETECTED Final   Haemophilus influenzae NOT DETECTED NOT DETECTED Final   Neisseria meningitidis NOT DETECTED NOT DETECTED Final   Pseudomonas aeruginosa NOT DETECTED NOT DETECTED Final   Stenotrophomonas maltophilia NOT DETECTED NOT DETECTED Final   Candida albicans NOT DETECTED NOT DETECTED Final   Candida auris NOT DETECTED NOT DETECTED Final   Candida glabrata NOT DETECTED NOT DETECTED Final   Candida krusei NOT DETECTED NOT DETECTED Final   Candida parapsilosis NOT DETECTED NOT DETECTED Final   Candida  tropicalis NOT DETECTED NOT DETECTED Final   Cryptococcus neoformans/gattii NOT DETECTED NOT DETECTED Final    Comment: Performed at [facility], [address] [place], [place] [address]  Culture, blood (Routine X 2) w Reflex to ID Panel     Status: None (Preliminary result)   Collection Time: 02/05/23  5:03 PM   Specimen: BLOOD LEFT ARM  Result Value Ref Range Status   Specimen Description BLOOD LEFT ARM  Final   Special Requests    Final    BOTTLES DRAWN AEROBIC ONLY Blood Culture results may not be optimal due to an inadequate volume of blood received in culture bottles   Culture   Final    NO GROWTH < 24 HOURS Performed at [facility], [address] [place], [place] [address]    Report Status PENDING  Incomplete  Culture, blood (Routine X 2) w Reflex to ID Panel     Status: None (Preliminary result)   Collection Time: 02/05/23  5:03 PM   Specimen: BLOOD  Result Value Ref Range Status   Specimen Description BLOOD SITE NOT SPECIFIED  Final   Special Requests   Final    BOTTLES DRAWN AEROBIC ONLY Blood Culture results may not be optimal due to an inadequate volume of blood received in culture bottles   Culture   Final    NO GROWTH < 24 HOURS Performed at [facility], [address] [place], [place] [address]    Report Status PENDING  Incomplete    Studies/Results: ECHOCARDIOGRAM COMPLETE Result Date: 02/06/2023    ECHOCARDIOGRAM REPORT   Patient Name:   NUMAN ZYLSTRA Weingart Date of Exam: 02/06/2023 Medical Rec #:  [id number]     Height:       66.0 in Accession #:    [id number]    Weight:       127.4 lb Date of Birth:  05/24/1965     BSA:          1.651 m Patient Age:    57 years      BP:           145/92 mmHg Patient Gender: M             HR:           80 bpm. Exam Location:  Inpatient Procedure: 2D Echo, Cardiac Doppler and Color Doppler Indications:    bacteremia  History:        Patient has no prior history of Echocardiogram examinations.  Sonographer:    Lanell Maduro Referring Phys: [id number] Peine N VAN DAM IMPRESSIONS  1. Left ventricular ejection fraction, by estimation, is 55%. The left ventricle has normal function. The left ventricle has no regional wall motion abnormalities. There is moderate concentric left ventricular hypertrophy. Left ventricular diastolic parameters are consistent with Grade II diastolic dysfunction (pseudonormalization).  2. D-shaped septum suggestive of RV pressure/volume  overload. Right ventricular systolic function is severely reduced. The right ventricular size is severely enlarged. Mildly increased right ventricular wall thickness. There is moderately elevated pulmonary artery systolic pressure. The estimated right ventricular systolic pressure is 54.5 mmHg.  3. Right atrial size was mildly dilated.  4. The mitral valve is normal in structure. Trivial mitral valve regurgitation. No evidence of mitral stenosis.  5. Tricuspid valve regurgitation is mild to moderate.  6. The aortic valve is tricuspid. There is moderate calcification of the aortic valve. Aortic valve regurgitation is trivial. No aortic stenosis is present.  7. The inferior vena cava is normal in size with <50% respiratory variability, suggesting right atrial pressure of 8 mmHg.  8. A small pericardial effusion is present.  9. Cannot rule out valvular vegetation with bacteremia but nothing definitive visualized. The aortic valve is thickened/calcified. FINDINGS  Left Ventricle: Left ventricular ejection fraction, by estimation, is 55%. The left ventricle has normal function. The left ventricle has no regional wall motion abnormalities. The left ventricular internal cavity size was normal in size. There is moderate concentric left ventricular hypertrophy. Left ventricular diastolic parameters are consistent with Grade II diastolic dysfunction (pseudonormalization). Right Ventricle: D-shaped septum suggestive of RV pressure/volume overload. The right ventricular size is severely enlarged. Mildly increased right ventricular wall thickness. Right ventricular systolic function is severely reduced. There is moderately elevated pulmonary artery systolic pressure. The tricuspid regurgitant velocity is 3.41 m/s, and with an assumed right atrial pressure of 8 mmHg, the estimated right ventricular systolic pressure is 54.5 mmHg. Left Atrium: Left atrial size was normal in size. Right Atrium: Right atrial size was mildly dilated.  Pericardium: A small pericardial effusion is present. Mitral Valve: The mitral valve is normal in structure. Trivial mitral valve regurgitation. No evidence of mitral valve stenosis. Tricuspid Valve: The tricuspid valve is normal in structure. Tricuspid valve regurgitation is mild to moderate. Aortic Valve: The aortic valve is tricuspid. There is moderate calcification of the aortic valve. Aortic valve regurgitation is trivial. No aortic stenosis is present. Pulmonic Valve: The pulmonic valve was normal in structure. Pulmonic valve regurgitation is not visualized. Aorta: The aortic root is normal in size and structure. Venous: The inferior vena cava is normal in size with less than 50% respiratory variability, suggesting right atrial pressure of 8 mmHg. IAS/Shunts: No atrial level shunt detected by color flow Doppler.  LEFT VENTRICLE PLAX 2D LVIDd:         3.00 cm     Diastology LVIDs:         1.70 cm     LV e' medial:    5.13 cm/s LV PW:         1.30 cm     LV E/e' medial:  14.0 LV IVS:        1.60 cm     LV e' lateral:   5.44 cm/s LVOT diam:     1.90 cm     LV E/e' lateral: 13.2 LV SV:         40 LV SV Index:   24 LVOT Area:     2.84 cm  LV Volumes (MOD) LV vol d, MOD A4C: 31.4 ml LV vol s, MOD A4C: 14.6 ml LV SV MOD A4C:     31.4 ml RIGHT VENTRICLE            IVC RV Basal diam:  5.40 cm    IVC diam: 1.40 cm RV Mid diam:    4.80 cm RV S prime:     6.53 cm/s TAPSE (M-mode): 1.5 cm LEFT ATRIUM           Index       RIGHT ATRIUM           Index LA diam:      3.10 cm 1.88 cm/m  RA Area:     18.80 cm LA Vol (A4C): 15.8 ml 9.57 ml/m  RA Volume:   54.90 ml  33.25 ml/m  AORTIC VALVE LVOT Vmax:   53.50 cm/s LVOT Vmean:  38.200 cm/s LVOT VTI:    0.140  m  AORTA Ao Root diam: 2.90 cm Ao Asc diam:  3.20 cm MITRAL VALVE               TRICUSPID VALVE MV Area (PHT): 4.49 cm    TR Peak grad:   46.5 mmHg MV Decel Time: 169 msec    TR Vmax:        341.00 cm/s MV E velocity: 71.60 cm/s MV A velocity: 60.00 cm/s  SHUNTS MV E/A  ratio:  1.19        Systemic VTI:  0.14 m                            Systemic Diam: 1.90 cm Dalton McleanMD Electronically signed by Ezra Kanner Signature Date/Time: 02/06/2023/8:48:46 AM    Final    MR BRAIN W WO CONTRAST Result Date: 02/05/2023 CLINICAL DATA:  Mental status change, unknown cause bacteremia, new HIV diagnosis, persistent encephalopathy EXAM: MRI HEAD WITHOUT AND WITH CONTRAST TECHNIQUE: Multiplanar, multiecho pulse sequences of the brain and surrounding structures were obtained without and with intravenous contrast. CONTRAST:  5mL GADAVIST  GADOBUTROL  1 MMOL/ML IV SOLN COMPARISON:  CT head 02/03/2023. FINDINGS: Motion limited study.  Within this limitation: Brain: No acute infarction, hemorrhage, hydrocephalus, extra-axial collection or mass lesion. No pathologic enhancement. Age advanced cerebral atrophy. Vascular: Major arterial flow voids are maintained at the skull base. Skull and upper cervical spine: Normal marrow signal. Sinuses/Orbits: Clear sinuses.  No acute orbital findings. Other: Trace bilateral mastoid effusions. IMPRESSION: 1. Motion limited study without evidence of acute intracranial abnormality. 2. Age advanced cerebral atrophy (ICD10-G31.9). Electronically Signed   By: Gilmore GORMAN Molt M.D.   On: 02/05/2023 14:33      Assessment/Plan:  INTERVAL HISTORY: Streptococcus pneumonia is sensitive to penicillin    Principal Problem:   Pneumococcal bacteremia Active Problems:   Sepsis (HCC)   Fall   Encephalopathy   Community acquired pneumonia   Thrombocytopenia (HCC)   Hematuria   Homelessness   Alcoholism (HCC)   AIDS (acquired immune deficiency syndrome) (HCC)   Positive RPR test   Cholelithiasis without cholecystitis   Aortic atherosclerosis (HCC)   Cerebral atrophy (HCC)   Horseshoe kidney   Malnutrition of moderate degree   HIV dementia (HCC)   HCV (hepatitis C virus)   Pulmonary hypertension (HCC)   Right ventricular systolic  dysfunction    Russell [name] is a 58 y.o. male with with history of polysubstance abuse including cocaine and alcohol had been previously homeless till his sister began letting him live at her place now admitted with pneumococcal pneumonia bacteremia and sepsis and newly diagnosed HIV AIDS.  #1 Pneumococcal bacteremia and pneumonia:  We can change some ceftriaxone  to high-dose penicillin . 2D echocardiogram showed some trivial mitral valve regurgitation as well as mild to moderate tricuspid valve regurgitation and trivial aortic valve regurgitation but no vegetations Repeat blood cultures are without growth  #2 HIV/AIDS/:  Continue BIktarvy  and Bactrim   Open genotype and integrates genotype.  He will need a bridge counselor and I think he is going to need help from his sister to be adherent to his medications.   #3 +RPR: Appears to be a false positive consents tPA is negative.  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  Lacks capacity will ultimately have to discuss his care with his sister but I think it is too early to get a good sense of his baseline cognition.  #4  Hepatitis C:  He can certainly treat this as an outpatient we cannot really initiate as an inpatient and it will be imperative that we know he can follow through with appointments and adherence to medication  My partner Dr. Dennise is available over the weekend for questions and my partner Dr. Efrain will take over the service on Monday.    LOS: 3 days   Jomarie Fleeta Rothman 02/06/2023, 2:35 PM

## 2023-02-06 NOTE — Progress Notes (Signed)
 SLP Cancellation Note  Patient Details Name: Russell [name] MRN: [id number] DOB: 1965/04/12   Cancelled treatment:        Spoke with RN and IM team. Pt continues with confusion. Attempted to leave AMA again 1/9  MRI 1/9 without acute findings. Pt continues to be a poor candidate for speech therapy at this time.  SLP will sign off at present.  If pt experiences improvement in mentation following medical treatment for his infections, and will be able to participate meaningfully in speech therapy, please re-consult ST.  Pt may benefit from reassessment at next level of care.    Anette FORBES Grippe, MA, CCC-SLP Acute Rehabilitation Services Office: [phone] 02/06/2023, 10:11 AM

## 2023-02-06 NOTE — TOC CM/SW Note (Addendum)
        Patient Goals and CMS Choice     CSW reached out to pt's sister, Isaiah, to assist in completing assessment, as pt is confused, pt is from home with sister. Pt has history of being houseless but is currently staying with sister. CSW unable to reach sister, left VM. Pt has recommendation for SNF at discharge. Possible SNF workup pending if pt and pt's family agreeable.   Housing resources placed in pt's chart to assist with housing needs.   TOC will continue to follow.     Expected Discharge Plan and Services                                                Prior Living Arrangements/Services                       Activities of Daily Living   ADL Screening (condition at time of admission) Independently performs ADLs?: Yes (appropriate for developmental age) Is the patient deaf or have difficulty hearing?: No Does the patient have difficulty seeing, even when wearing glasses/contacts?: Yes Does the patient have difficulty concentrating, remembering, or making decisions?: Yes  Permission Sought/Granted                  Emotional Assessment              Admission diagnosis:  Sepsis (HCC) [A41.9] Pneumonia of left lower lobe due to infectious organism [J18.9] Patient Active Problem List   Diagnosis Date Noted   HCV (hepatitis C virus) 02/06/2023   Pulmonary hypertension (HCC) 02/06/2023   Right ventricular systolic dysfunction 02/06/2023   Malnutrition of moderate degree 02/05/2023   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   Homelessness 02/04/2023   Alcoholism (HCC) 02/04/2023   AIDS (acquired immune deficiency syndrome) (HCC) 02/04/2023   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/2023   Pneumococcal bacteremia 02/04/2023   Cholelithiasis without cholecystitis 02/04/2023   Aortic atherosclerosis (HCC) 02/04/2023   Cerebral atrophy (HCC) 02/04/2023   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   Sepsis (HCC) 02/03/2023   Fall 02/03/2023   Encephalopathy 02/03/2023    Community acquired pneumonia 02/03/2023   Thrombocytopenia (HCC) 02/03/2023   Hematuria 02/03/2023   Closed dislocation of right ankle 06/03/2018   PCP:  Patient, No Pcp Per Pharmacy:   [id number] [place], [place] - [address] [place] [address] Phone: [phone] Fax: [phone]  [id number] [place], [place] - [address] [place] [address] Phone: [phone] Fax: [phone]  [id number] - [place], [place] - [address] AT [facility] [address] [place] [address] Phone: [phone] Fax: [phone]     Social Determinants of Health (SDOH) Interventions    Readmission Risk Interventions     No data to display

## 2023-02-06 NOTE — Progress Notes (Addendum)
 Subjective: Russell [name] is a 58 y.o. male with remote history of cocaine use disorder and housing instability presenting with deteriorating mental status and admitted for septic encephalopathy.  Today, patient still appears confused.  He states that he feels okay and feels short of breath here and there.  He denies other acute complaints.  Patient is frustrated that he is still in the hospital and needs to go home and work.  Objective:  Vital signs in last 24 hours: Vitals:   02/05/23 0752 02/05/23 1750 02/06/23 0537 02/06/23 0834  BP: 126/87 (!) 141/97 (!) 145/92 (!) 145/98  Pulse: 78 83 73 72  Resp: 19 19 18 18   Temp: 97.8 F (36.6 C) 97.6 F (36.4 C) 97.6 F (36.4 C)   TempSrc: Oral Oral Oral   SpO2: 100% 98% 100% 100%  Weight:      Height:       Physical Exam: General: Patient sitting in bed eating breakfast with a glove on his right hand Cardiac: Regular rate and rhythm, no murmurs appreciated Pulmonary: Normal effort on room air Neuro: Awake, oriented to place, is overtly confused on exam MSK: Moving all 4 extremities spontaneously Skin: Warm and dry Psych: Unable to fully assess, confused     Latest Ref Rng & Units 02/05/2023   12:26 PM 02/04/2023    3:04 PM 02/04/2023    5:29 AM  CBC  WBC 4.0 - 10.5 K/uL 3.6   8.9   Hemoglobin 13.0 - 17.0 g/dL 86.6  86.5  86.8   Hematocrit 39.0 - 52.0 % 38.9   38.2   Platelets 150 - 400 K/uL 43   46        Latest Ref Rng & Units 02/05/2023   12:26 PM 02/04/2023    5:29 AM 02/03/2023   10:13 AM  BMP  Glucose 70 - 99 mg/dL 85  94  891   BUN 6 - 20 mg/dL 16  18  16    Creatinine 0.61 - 1.24 mg/dL 8.93  8.61  8.54   Sodium 135 - 145 mmol/L 133  133  131   Potassium 3.5 - 5.1 mmol/L 3.8  3.7  4.0   Chloride 98 - 111 mmol/L 109  108  106   CO2 22 - 32 mmol/L 15  18  14    Calcium  8.9 - 10.3 mg/dL 7.8  7.6  8.2      Assessment/Plan:  Principal Problem:   Pneumococcal bacteremia Active Problems:   Sepsis (HCC)   Fall    Encephalopathy   Community acquired pneumonia   Thrombocytopenia (HCC)   Hematuria   Homelessness   Alcoholism (HCC)   AIDS (acquired immune deficiency syndrome) (HCC)   Positive RPR test   Cholelithiasis without cholecystitis   Aortic atherosclerosis (HCC)   Cerebral atrophy (HCC)   Horseshoe kidney   Malnutrition of moderate degree   HIV dementia (HCC)   HCV (hepatitis C virus)   Pulmonary hypertension (HCC)   Right ventricular systolic dysfunction  Acute encephalopathy  Severe sepsis 2/2 pneumococcal bacteremia CAP Patient's acute encephalopathy is most likely due to pneumococcal bacteremia; however, his level of current encephalopathy is difficult to assess with his underlying HIV dementia.  We are still unclear on his prior baseline, overall he has improved since day of admission but he remains confused.  Susceptibilities returned and appears that Streptococcus is pansensitive. Plan: -Will complete 7-day course of antibiotic therapy,transition from ceftriaxone  to IV penicillin  per ID  -Will continue to follow  repeat blood cultures -TTE did not show valvular vegetations. If follow-up blood cultures remain negative, I do not believe that a TEE will be indicated or change current management  AIDS HIV Dementia  Cerebral Atrophy  MRI shows age advanced cerebral atrophy.  I suspect that he has underlying dementia from chronic untreated HIV.  Unsure of what his baseline is at this moment.  Plan: -Continue Biktarvy , double strength Bactrim  daily for PJP prophylaxis -Psychiatry is consulted, appreciate their recommendations  -Continue Crestor  5 mg -Will start delirium precautions -Patient will require increased assistance at home especially with the introduction of new medications -OT recommends SNF placement  Severely reduced right ventricular systolic function Pulmonary Artery HTN Will recommend outpatient cardiology evaluation with possible RH for further evaluation.    HCV Patient is positive for hepatitis C with a viral load of 19,300,000 -Will defer treatment to infectious disease outpatient clinic -Patient will need HCC monitoring   Thrombocytopenia  Patient does not have an updated platelet count for this morning.  I suspect his thrombocytopenia is multifactorial from underlying liver dysfunction from hepatitis C virus as well as peripheral destruction from sepsis. -Will continue to monitor with daily CBCs, follow-up this morning's platelet level -Will continue to hold off on pharmaceutical VTE prophylaxis, continue with SCDs.  Resolved Problems:  AKI RPR positive, T palladium antibodies are nonreactive __________________________________ VTE Prophylaxis:SCDs Diet: Regular  IVF:N/A Anticipated Discharge Location:SNF Barriers to Discharge:Medical Treatment Dispo: Anticipated discharge in approximately 1-2 day(s).   Kandis Perkins, DO 02/06/2023, 11:29 AM Pager: [phone] After 5pm on weekdays and 1pm on weekends: On Call pager [phone]

## 2023-02-07 LAB — COMPREHENSIVE METABOLIC PANEL
ALT: 43 U/L (ref 0–44)
AST: 149 U/L — ABNORMAL HIGH (ref 15–41)
Albumin: 2 g/dL — ABNORMAL LOW (ref 3.5–5.0)
Alkaline Phosphatase: 77 U/L (ref 38–126)
Anion gap: 7 (ref 5–15)
BUN: 9 mg/dL (ref 6–20)
CO2: 16 mmol/L — ABNORMAL LOW (ref 22–32)
Calcium: 8 mg/dL — ABNORMAL LOW (ref 8.9–10.3)
Chloride: 108 mmol/L (ref 98–111)
Creatinine, Ser: 0.96 mg/dL (ref 0.61–1.24)
GFR, Estimated: >60 mL/min (ref >60)
Glucose, Bld: 116 mg/dL — ABNORMAL HIGH (ref 70–99)
Potassium: 4.1 mmol/L (ref 3.5–5.1)
Sodium: 131 mmol/L — ABNORMAL LOW (ref 135–145)
Total Bilirubin: 1.6 mg/dL — ABNORMAL HIGH (ref 0.0–1.2)
Total Protein: 9.3 g/dL — ABNORMAL HIGH (ref 6.5–8.1)

## 2023-02-07 LAB — CBC
HCT: 44 % (ref 39.0–52.0)
Hemoglobin: 15.5 g/dL (ref 13.0–17.0)
MCH: 34.8 pg — ABNORMAL HIGH (ref 26.0–34.0)
MCHC: 35.2 g/dL (ref 30.0–36.0)
MCV: 98.9 fL (ref 80.0–100.0)
Platelets: 48 10*3/uL — ABNORMAL LOW (ref 150–400)
RBC: 4.45 MIL/uL (ref 4.22–5.81)
RDW: 13.2 % (ref 11.5–15.5)
WBC: 3.5 10*3/uL — ABNORMAL LOW (ref 4.0–10.5)
nRBC: 0 % (ref 0.0–0.2)

## 2023-02-07 MED ORDER — AMLODIPINE BESYLATE 5 MG PO TABS
5.0000 mg | ORAL_TABLET | Freq: Every day | ORAL | Status: DC
  Administered 2023-02-07 – 2023-04-03 (×54): 5 mg via ORAL
  Filled 2023-02-07 (×56): qty 1

## 2023-02-07 MED ORDER — LABETALOL HCL 5 MG/ML IV SOLN
10.0000 mg | Freq: Once | INTRAVENOUS | Status: AC
  Administered 2023-02-07: 10 mg via INTRAVENOUS
  Filled 2023-02-07: qty 4

## 2023-02-07 MED ORDER — LABETALOL HCL 5 MG/ML IV SOLN
10.0000 mg | Freq: Once | INTRAVENOUS | Status: AC
Start: 2023-02-07 — End: 2023-02-07
  Administered 2023-02-07: 10 mg via INTRAVENOUS
  Filled 2023-02-07: qty 4

## 2023-02-07 NOTE — Progress Notes (Addendum)
 Subjective: Russell [name] is a 58 y.o. male with remote history of cocaine use disorder and housing instability presenting with deteriorating mental status and admitted for septic encephalopathy.  Today, patient denied any acute complaints.  He was struggling to dress himself this morning.  He remains oriented to place and self.  We discussed his HIV diagnosis with him.   Objective:  Vital signs in last 24 hours: Vitals:   02/07/23 0839 02/07/23 0944 02/07/23 1132 02/07/23 1158  BP: (!) 204/133 (!) 214/132 (!) 211/132 (!) 174/126  Pulse: (!) 101 (!) 102 98 88  Resp: (!) 22     Temp: 99.1 F (37.3 C)     TempSrc:      SpO2: 100% 100%  97%  Weight:      Height:       Physical Exam: General:patient laying in bed undressed Pulmonary:normal effort on RA Neuro: Alert to self and place, remains unaware to situation and time, stable from yesterday  MSK:No pitting edema present bilaterally  Skin:warm and dry  Psych: unable to assess, confused     Latest Ref Rng & Units 02/07/2023   11:44 AM 02/05/2023   12:26 PM 02/04/2023    3:04 PM  CBC  WBC 4.0 - 10.5 K/uL 3.5  3.6    Hemoglobin 13.0 - 17.0 g/dL 84.4  86.6  86.5   Hematocrit 39.0 - 52.0 % 44.0  38.9    Platelets 150 - 400 K/uL 48  43         Latest Ref Rng & Units 02/07/2023   11:44 AM 02/05/2023   12:26 PM 02/04/2023    5:29 AM  BMP  Glucose 70 - 99 mg/dL 883  85  94   BUN 6 - 20 mg/dL 9  16  18    Creatinine 0.61 - 1.24 mg/dL 9.03  8.93  8.61   Sodium 135 - 145 mmol/L 131  133  133   Potassium 3.5 - 5.1 mmol/L 4.1  3.8  3.7   Chloride 98 - 111 mmol/L 108  109  108   CO2 22 - 32 mmol/L 16  15  18    Calcium  8.9 - 10.3 mg/dL 8.0  7.8  7.6      Assessment/Plan:  Principal Problem:   Pneumococcal bacteremia Active Problems:   Sepsis (HCC)   Fall   Encephalopathy   Community acquired pneumonia   Thrombocytopenia (HCC)   Hematuria   Homelessness   Alcoholism (HCC)   AIDS (acquired immune deficiency syndrome) (HCC)    Positive RPR test   Cholelithiasis without cholecystitis   Aortic atherosclerosis (HCC)   Cerebral atrophy (HCC)   Horseshoe kidney   Malnutrition of moderate degree   HIV dementia (HCC)   HCV (hepatitis C virus)   Pulmonary hypertension (HCC)   Right ventricular systolic dysfunction   Biological false positive RPR test  Acute encephalopathy  Severe sepsis 2/2 pneumococcal bacteremia CAP Patient's acute encephalopathy is most likely due to pneumococcal bacteremia; however, his level of current encephalopathy is difficult to assess with his underlying HIV dementia.  We are still unclear on his prior baseline, overall he has improved since day of admission but he remains confused.   Plan: -Will complete 7-day course of antibiotic therapy, IV penicillin  day 5/7  -Repeat blood cultures are no growth at 2 days  -TTE did not show valvular vegetations. If follow-up blood cultures remain negative, I do not believe that a TEE will be indicated or change current management  AIDS HIV Dementia  Cerebral Atrophy  MRI shows age advanced cerebral atrophy.  I suspect that he has underlying dementia from chronic untreated HIV.  Unsure of what his baseline is at this moment. He remains confused and only oriented to self/place. Psychiatry evaluated him and patient does not have the capacity to make medical decisions. Plan: -Continue Biktarvy , double strength Bactrim  daily for PJP prophylaxis -Continue Crestor  5 mg -Delirium precautions -Patient will require increased assistance at home especially with the introduction of new medications -TOC was consulted for starting the legal guardianship process -OT recommends SNF placement  HTN I was called to bedside to evaluate patient's blood pressure this morning.  Patient was resting comfortably with increased blood pressure of 204/133. He denied symptoms. He was no longer anxious after receiving PO ativan  with continued elevation in BP. Serum creatine is  0.96. Since I am unable to fully assess for symptoms with the encephalopathy and suspected dementia, 10 mg IV labetalol  was ordered. Plan: -Will continue to monitor BP, I will consider adding further PRNs if he remains HTN and/or symptomatic throughout the day  -Started amlodipine  5 mg for chronic BP control   HCV Patient is positive for hepatitis C with a viral load of 19,300,000 -Will defer treatment to infectious disease outpatient clinic -Patient will need HCC monitoring   Thrombocytopenia  I suspect his thrombocytopenia is multifactorial from underlying liver dysfunction from hepatitis C virus as well as peripheral destruction from sepsis.  Thrombocytopenia remains low at 48K.  -Will continue to monitor with daily CBCs -Will continue to hold off on pharmaceutical VTE prophylaxis, continue with SCDs.  Severely reduced right ventricular systolic function Pulmonary Artery HTN Patient remains euvolemic. Will recommend outpatient cardiology evaluation with possible RH for further evaluation.   Resolved Problems:  AKI RPR positive, T palladium antibodies are nonreactive __________________________________ VTE Prophylaxis:SCDs Diet: Regular  IVF:N/A Anticipated Discharge Location:SNF Barriers to Discharge:Medical Treatment Dispo: Anticipated discharge in approximately 1-2 day(s).   Kandis Perkins, DO 02/07/2023, 1:42 PM Pager: [phone] After 5pm on weekdays and 1pm on weekends: On Call pager [phone]

## 2023-02-07 NOTE — Plan of Care (Signed)
 Pt alert X2, pt refusues tele, watch bp, attending notified and labetalol given, RH-22g iv, intermittent incontinence, will not keep condom cath on

## 2023-02-08 DIAGNOSIS — F0280 Dementia in other diseases classified elsewhere without behavioral disturbance: Secondary | ICD-10-CM

## 2023-02-08 DIAGNOSIS — B953 Streptococcus pneumoniae as the cause of diseases classified elsewhere: Secondary | ICD-10-CM | POA: Diagnosis not present

## 2023-02-08 DIAGNOSIS — R7881 Bacteremia: Secondary | ICD-10-CM

## 2023-02-08 DIAGNOSIS — B20 Human immunodeficiency virus [HIV] disease: Secondary | ICD-10-CM | POA: Diagnosis not present

## 2023-02-08 LAB — BASIC METABOLIC PANEL
Anion gap: 6 (ref 5–15)
BUN: 11 mg/dL (ref 6–20)
CO2: 17 mmol/L — ABNORMAL LOW (ref 22–32)
Calcium: 7.8 mg/dL — ABNORMAL LOW (ref 8.9–10.3)
Chloride: 108 mmol/L (ref 98–111)
Creatinine, Ser: 0.94 mg/dL (ref 0.61–1.24)
GFR, Estimated: >60 mL/min (ref >60)
Glucose, Bld: 96 mg/dL (ref 70–99)
Potassium: 4.4 mmol/L (ref 3.5–5.1)
Sodium: 131 mmol/L — ABNORMAL LOW (ref 135–145)

## 2023-02-08 LAB — CBC
HCT: 42.1 % (ref 39.0–52.0)
Hemoglobin: 14.3 g/dL (ref 13.0–17.0)
MCH: 33.3 pg (ref 26.0–34.0)
MCHC: 34 g/dL (ref 30.0–36.0)
MCV: 97.9 fL (ref 80.0–100.0)
Platelets: 57 10*3/uL — ABNORMAL LOW (ref 150–400)
RBC: 4.3 MIL/uL (ref 4.22–5.81)
RDW: 13.7 % (ref 11.5–15.5)
WBC: 5.6 10*3/uL (ref 4.0–10.5)
nRBC: 0 % (ref 0.0–0.2)

## 2023-02-08 LAB — CULTURE, BLOOD (ROUTINE X 2)
Culture: NO GROWTH
Special Requests: ADEQUATE

## 2023-02-08 NOTE — Plan of Care (Signed)
  Problem: Education: Goal: Knowledge of General Education information will improve Description: Including pain rating scale, medication(s)/side effects and non-pharmacologic comfort measures 02/08/2023 1516 by Aleta Mike, RN Outcome: Progressing 02/08/2023 1516 by Aleta Mike, RN Outcome: Progressing   Problem: Health Behavior/Discharge Planning: Goal: Ability to manage health-related needs will improve 02/08/2023 1516 by Aleta Mike, RN Outcome: Progressing 02/08/2023 1516 by Aleta Mike, RN Outcome: Progressing   Problem: Clinical Measurements: Goal: Ability to maintain clinical measurements within normal limits will improve 02/08/2023 1516 by Aleta Mike, RN Outcome: Progressing 02/08/2023 1516 by Aleta Mike, RN Outcome: Progressing Goal: Will remain free from infection 02/08/2023 1516 by Aleta Mike, RN Outcome: Progressing 02/08/2023 1516 by Aleta Mike, RN Outcome: Progressing Goal: Diagnostic test results will improve 02/08/2023 1516 by Aleta Mike, RN Outcome: Progressing 02/08/2023 1516 by Aleta Mike, RN Outcome: Progressing Goal: Respiratory complications will improve 02/08/2023 1516 by Aleta Mike, RN Outcome: Progressing 02/08/2023 1516 by Aleta Mike, RN Outcome: Progressing Goal: Cardiovascular complication will be avoided 02/08/2023 1516 by Aleta Mike, RN Outcome: Progressing 02/08/2023 1516 by Aleta Mike, RN Outcome: Progressing   Problem: Activity: Goal: Risk for activity intolerance will decrease 02/08/2023 1516 by Aleta Mike, RN Outcome: Progressing 02/08/2023 1516 by Aleta Mike, RN Outcome: Progressing   Problem: Nutrition: Goal: Adequate nutrition will be maintained 02/08/2023 1516 by Aleta Mike, RN Outcome: Progressing 02/08/2023 1516 by Aleta Mike, RN Outcome: Progressing   Problem: Coping: Goal: Level of anxiety will decrease 02/08/2023 1516 by Aleta Mike, RN Outcome: Progressing 02/08/2023 1516 by  Aleta Mike, RN Outcome: Progressing   Problem: Elimination: Goal: Will not experience complications related to bowel motility 02/08/2023 1516 by Aleta Mike, RN Outcome: Progressing 02/08/2023 1516 by Aleta Mike, RN Outcome: Progressing Goal: Will not experience complications related to urinary retention 02/08/2023 1516 by Aleta Mike, RN Outcome: Progressing 02/08/2023 1516 by Aleta Mike, RN Outcome: Progressing   Problem: Pain Management: Goal: General experience of comfort will improve 02/08/2023 1516 by Aleta Mike, RN Outcome: Progressing 02/08/2023 1516 by Aleta Mike, RN Outcome: Progressing   Problem: Safety: Goal: Ability to remain free from injury will improve 02/08/2023 1516 by Aleta Mike, RN Outcome: Progressing 02/08/2023 1516 by Aleta Mike, RN Outcome: Progressing   Problem: Skin Integrity: Goal: Risk for impaired skin integrity will decrease 02/08/2023 1516 by Aleta Mike, RN Outcome: Progressing 02/08/2023 1516 by Aleta Mike, RN Outcome: Progressing

## 2023-02-08 NOTE — Progress Notes (Signed)
   Paged by the nurse after patient was found sitting on the floor. Patient was seen and evaluated at bedside. Reports he fell from the bed but didn't hit his head. Has mild cognitive impairment at baseline and it is unclear how he got on the floor. Nursing reports patient expressed concerns of wanting to go home,uncertain if he actually fell or if he was trying to leave the floor. On exam , there is no sign of trauma or active bleeding,no focal neurological deficit noted. Pt knew his name and knows he is at [facility]. Nursing reports his mentation has not changed from his baseline prior to being found seated on the floor ,low concerns for intracranial hemorrhaging secondary to trauma at this time.Will defer head imaging for now but will consider it if mental status changes. If patient is suspected of wanting to leave AMA or eloping , will IVC him due to lack of capacity.   Signature: Drue Grow , MD Internal Medicine Resident, PGY-1 [facility]  Pager: [phone] 10:26 PM, 02/08/2023   Please contact the on call pager after 5 pm and on weekends at [phone].

## 2023-02-08 NOTE — Progress Notes (Addendum)
 Subjective: Russell [name] is a 58 y.o. male with remote history of cocaine use disorder and housing instability presenting with deteriorating mental status and admitted for septic encephalopathy.  Today, patient reports doing fine. He continues to demonstrate poor insight to his condition.  He denies any bothersome complaints at this time.  He stated that he enjoyed breakfast.  Objective:  Vital signs in last 24 hours: Vitals:   02/07/23 1815 02/07/23 2014 02/08/23 0428 02/08/23 0920  BP: (!) 174/122 (!) 154/106 (!) 168/105 133/86  Pulse: 88 91 83 78  Resp:  18 17 20   Temp:  99.3 F (37.4 C) 97.6 F (36.4 C) 98 F (36.7 C)  TempSrc:  Oral Oral Oral  SpO2:  98% 99% 100%  Weight:      Height:       Physical Exam: General: No acute distress, sitting in bed, watching TV Cardiac: Regular rate and rhythm, no murmurs appreciated Pulmonary: Effort on room air, no tachypnea present Neuro: Patient remains awake, alert and oriented to self and place.  He is not oriented to time or situation.  He continues to demonstrate poor insight into his condition.  He does not answer questions appropriately.  His cognition has not improved since day 2 of hospitalization after we started IV antibiotics MSK: No bilateral pitting edema appreciated Skin: Warm and dry Psych: Remains confused, calm     Latest Ref Rng & Units 02/08/2023   11:19 AM 02/07/2023   11:44 AM 02/05/2023   12:26 PM  CBC  WBC 4.0 - 10.5 K/uL 5.6  3.5  3.6   Hemoglobin 13.0 - 17.0 g/dL 85.6  84.4  86.6   Hematocrit 39.0 - 52.0 % 42.1  44.0  38.9   Platelets 150 - 400 K/uL 57  48  43        Latest Ref Rng & Units 02/08/2023   11:19 AM 02/07/2023   11:44 AM 02/05/2023   12:26 PM  BMP  Glucose 70 - 99 mg/dL 96  883  85   BUN 6 - 20 mg/dL 11  9  16    Creatinine 0.61 - 1.24 mg/dL 9.05  9.03  8.93   Sodium 135 - 145 mmol/L 131  131  133   Potassium 3.5 - 5.1 mmol/L 4.4  4.1  3.8   Chloride 98 - 111 mmol/L 108  108  109   CO2 22 -  32 mmol/L 17  16  15    Calcium  8.9 - 10.3 mg/dL 7.8  8.0  7.8      Assessment/Plan:  Principal Problem:   Pneumococcal bacteremia Active Problems:   Sepsis (HCC)   Fall   Encephalopathy   Community acquired pneumonia   Thrombocytopenia (HCC)   Hematuria   Homelessness   Alcoholism (HCC)   AIDS (acquired immune deficiency syndrome) (HCC)   Positive RPR test   Cholelithiasis without cholecystitis   Aortic atherosclerosis (HCC)   Cerebral atrophy (HCC)   Horseshoe kidney   Malnutrition of moderate degree   HIV dementia (HCC)   HCV (hepatitis C virus)   Pulmonary hypertension (HCC)   Right ventricular systolic dysfunction   Biological false positive RPR test  Acute encephalopathy  Severe sepsis 2/2 pneumococcal bacteremia CAP Patient's acute encephalopathy was most likely due to pneumococcal bacteremia.  When the patient presented to the hospital, he was disheveled, unable to follow commands, and was not oriented to person place or time.  After one day of IV antibiotics to treat his  bacteremia, his encephalopathy improved to a level of cognition that was reflected by orientation to self and place, patient remained not oriented to situation and time.  Today is day 6 out of 7 of IV antibiotics and his cognition remains only alert to self and place, and he continues to lack insight to his conditions.  The strep pneumococcal bacteremia was pansensitive and he received the appropriate antibiotic regimen the entirety of treatment.  Patient's cognition has not improved since day 2 of hospitalization after he was on IV antibiotics for 24 hours.  I do suspect that patient's cognition will not improve at this time and that this is his baseline. Plan: -Will complete 7-day course of antibiotic therapy, IV penicillin  day 6/7  -Repeat blood cultures are no growth to date -TTE did not show valvular vegetations.  Blood cultures have remained negative, I do not believe that a TEE will be indicated or  change current management  AIDS HIV Dementia  Cerebral Atrophy  MRI shows age advanced cerebral atrophy.  I suspect that he has underlying dementia from chronic untreated HIV. He remains confused and only oriented to self/place, I believe that this is his baseline mentation. Psychiatry evaluated him and patient does not have the capacity to make medical decisions.  Will reach out to patient's sister today. Plan: -Continue Biktarvy , double strength Bactrim  daily for PJP prophylaxis -Continue Crestor  5 mg -Delirium precautions -Patient will require increased assistance at home especially with the introduction of new medications -TOC was consulted for starting the legal guardianship process -OT recommends SNF placement  HTN Patient's hypertension improved this morning.  His blood pressure was 133/86 and he did not require as needed IV labetalol  overnight. Plan: -Will continue to monitor BP, I will consider adding further PRNs if he remains HTN and/or symptomatic throughout the day  -Continue amlodipine  5 mg for chronic BP control   HCV Patient is positive for hepatitis C with a viral load of 19,300,000 -Will defer treatment to infectious disease outpatient clinic -Patient will need HCC monitoring if he has evidence of advanced fibrosis, will require additional outpatient workup  Thrombocytopenia  I suspect his thrombocytopenia is multifactorial from underlying liver dysfunction from hepatitis C virus as well as peripheral destruction from sepsis.  Thrombocytopenia remains low, will need to follow-up today's platelet count. -Will continue to monitor with daily CBCs -Will continue to hold off on pharmaceutical VTE prophylaxis, continue with SCDs.  Severely reduced right ventricular systolic function Pulmonary Artery HTN Patient remains euvolemic. Will recommend outpatient cardiology evaluation with possible RHC for further evaluation.   Resolved Problems:  AKI RPR positive, T palladium  antibodies are nonreactive __________________________________ VTE Prophylaxis:SCDs Diet: Regular  IVF:N/A Anticipated Discharge Location:SNF Barriers to Discharge:Medical Treatment Dispo: Anticipated discharge in approximately 1-2 day(s).   Kandis Perkins, DO 02/08/2023, 5:44 AM Pager: [phone] After 5pm on weekdays and 1pm on weekends: On Call pager [phone]

## 2023-02-09 ENCOUNTER — Inpatient Hospital Stay (HOSPITAL_COMMUNITY): Payer: Commercial Managed Care - HMO

## 2023-02-09 DIAGNOSIS — J154 Pneumonia due to other streptococci: Secondary | ICD-10-CM | POA: Diagnosis not present

## 2023-02-09 DIAGNOSIS — R7881 Bacteremia: Secondary | ICD-10-CM | POA: Diagnosis not present

## 2023-02-09 DIAGNOSIS — B182 Chronic viral hepatitis C: Secondary | ICD-10-CM | POA: Diagnosis not present

## 2023-02-09 DIAGNOSIS — I272 Pulmonary hypertension, unspecified: Secondary | ICD-10-CM

## 2023-02-09 DIAGNOSIS — F0390 Unspecified dementia without behavioral disturbance: Secondary | ICD-10-CM | POA: Diagnosis not present

## 2023-02-09 DIAGNOSIS — B20 Human immunodeficiency virus [HIV] disease: Secondary | ICD-10-CM | POA: Diagnosis not present

## 2023-02-09 DIAGNOSIS — B953 Streptococcus pneumoniae as the cause of diseases classified elsewhere: Secondary | ICD-10-CM | POA: Diagnosis not present

## 2023-02-09 LAB — BASIC METABOLIC PANEL
Anion gap: 5 (ref 5–15)
BUN: 12 mg/dL (ref 6–20)
CO2: 18 mmol/L — ABNORMAL LOW (ref 22–32)
Calcium: 7.7 mg/dL — ABNORMAL LOW (ref 8.9–10.3)
Chloride: 108 mmol/L (ref 98–111)
Creatinine, Ser: 1.14 mg/dL (ref 0.61–1.24)
GFR, Estimated: >60 mL/min (ref >60)
Glucose, Bld: 82 mg/dL (ref 70–99)
Potassium: 4.3 mmol/L (ref 3.5–5.1)
Sodium: 131 mmol/L — ABNORMAL LOW (ref 135–145)

## 2023-02-09 LAB — CBC
HCT: 38.6 % — ABNORMAL LOW (ref 39.0–52.0)
Hemoglobin: 13 g/dL (ref 13.0–17.0)
MCH: 33.5 pg (ref 26.0–34.0)
MCHC: 33.7 g/dL (ref 30.0–36.0)
MCV: 99.5 fL (ref 80.0–100.0)
Platelets: 56 10*3/uL — ABNORMAL LOW (ref 150–400)
RBC: 3.88 MIL/uL — ABNORMAL LOW (ref 4.22–5.81)
RDW: 13.8 % (ref 11.5–15.5)
WBC: 4.1 10*3/uL (ref 4.0–10.5)
nRBC: 0 % (ref 0.0–0.2)

## 2023-02-09 LAB — BRAIN NATRIURETIC PEPTIDE: B Natriuretic Peptide: 383.3 pg/mL — ABNORMAL HIGH (ref 0.0–100.0)

## 2023-02-09 MED ORDER — LACTULOSE 10 GM/15ML PO SOLN
10.0000 g | Freq: Three times a day (TID) | ORAL | Status: DC
  Administered 2023-02-09 – 2023-02-10 (×3): 10 g via ORAL
  Filled 2023-02-09 (×3): qty 15

## 2023-02-09 MED ORDER — IOHEXOL 350 MG/ML SOLN
75.0000 mL | Freq: Once | INTRAVENOUS | Status: AC | PRN
  Administered 2023-02-09: 75 mL via INTRAVENOUS

## 2023-02-09 MED ORDER — AMOXICILLIN 500 MG PO CAPS
1000.0000 mg | ORAL_CAPSULE | Freq: Three times a day (TID) | ORAL | Status: DC
  Administered 2023-02-09: 1000 mg via ORAL
  Filled 2023-02-09: qty 2

## 2023-02-09 NOTE — TOC Progression Note (Addendum)
 Transition of Care [facility]) - Progression Note    Patient Details  Name: Russell [name] MRN: [id number] Date of Birth: 11-Feb-1965  [facility] CM/SW Contact  Loring Liskey A Shatasia Cutshaw, LCSWA Phone Number: 02/09/2023, 2:57 PM  Clinical Narrative:     CSW sent referral for SNF per recommendation as pt is not oriented and CSW unsuccessful in attempts to reach sister. Bed offers pending. CSW left VM with pt's sister to reach out to CSW. CSW left contact information to reach out to CSW for updates.    TOC will continue to follow.        Expected Discharge Plan and Services                                               Social Determinants of Health (SDOH) Interventions SDOH Screenings   Food Insecurity: No Food Insecurity (02/04/2023)  Housing: High Risk (02/04/2023)  Transportation Needs: No Transportation Needs (02/04/2023)  Utilities: Not At Risk (02/04/2023)  Tobacco Use: High Risk (02/03/2023)    Readmission Risk Interventions     No data to display

## 2023-02-09 NOTE — Progress Notes (Signed)
    To eval pt further, need CTA PE protocol (acute) and VQ scan to look for chronic PE.  Spoke w/ his sister, Ms Nnaemeka Samson.  Risks and benefits were reviewed w/ her and she consents to both procedures.   Dr Pietro was also present to witness the verbal consent.   Shona Shad, PA-C 02/09/2023 2:00 PM

## 2023-02-09 NOTE — Consult Note (Addendum)
 Cardiology Consultation   Patient ID: Russell [name] MRN: [id number]; DOB: 1965-08-09  Admit date: 02/03/2023 Date of Consult: 02/09/2023  PCP:  Patient, No Pcp Per   [facility] Providers Cardiologist:  Redell Shallow, MD new  Patient Profile:   Russell [name] is a 58 y.o. male with no recent medical care, a hx of remote cocaine/ETOH use, who is being seen 02/09/2023 for the evaluation of pulm HTN and RV failure at the request of Dr Eben.  History of Present Illness:   Russell [name] has not seen Cardiology before.  He was admitted 01/07 w/ AMS. He also had had some incontinence of stool.  Dx was encephalopathy, sepsis, CAP 2nd strep PNA, bacteremia.   He was dx w/ HIV dz, Hep B pos. Started on Biktarvy . 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 Plt low at 48, felt from underlying liver dz 2nd Hep B.   Psych saw him and determined he lacks capacity to make medical decisions. MRI w/ advanced cerebellar atrophy.   Echo w/ severe reduction RV function, RVSP 54.5, Cards asked to see.   Russell [name] is very polite.  He is oriented to name and place.  He participates in the exam, but talks during it as well.  He denies ever having had chest pain.  He says he has had dyspnea on exertion, but cannot tell me where, when, or what he was doing.  He does admit that he has had a little bit of it since in the hospital but in general is doing better than on admission.    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   Pneumococcal bacteremia 02/04/2023    Past Surgical History:  Procedure Laterality Date   ORIF ANKLE FRACTURE Right 06/03/2018   Procedure: OPEN REDUCTION INTERNAL FIXATION (ORIF) TRIMALLEOLAR ANKLE FRACTURE WITH SYNDESMOSIS;  Surgeon: Josefina Chew, MD;  Location:   SURGERY CENTER;  Service: Orthopedics;  Laterality: Right;     Home Medications:  Prior to Admission medications   None    Inpatient Medications: Scheduled Meds:  amLODipine   5 mg Oral Daily   amoxicillin   1,000 mg Oral TID   bictegravir-emtricitabine -tenofovir  AF  1 tablet Oral Daily   feeding supplement  237 mL Oral BID BM   folic acid   1 mg Oral Daily   multivitamin with minerals  1 tablet Oral Daily   rosuvastatin   5 mg Oral Daily   sodium chloride  flush  3 mL Intravenous Q12H   sulfamethoxazole -trimethoprim   1 tablet Oral Daily   thiamine   100 mg Oral Daily   Or   thiamine   100 mg Intravenous Daily   Continuous Infusions:  PRN Meds: sodium chloride  flush  Allergies:   No Known Allergies  Social History:   Social History   Socioeconomic History   Marital status: Single    Spouse name: Not on file   Number of children: Not on file   Years of education: Not on file   Highest education level: Not on file  Occupational History   Not on file  Tobacco Use   Smoking status: Some Days   Smokeless tobacco: Never  Vaping Use   Vaping status: Never Used  Substance and Sexual Activity   Alcohol use: Yes    Comment: 6 pack a day   Drug use:  Yes    Types: Marijuana    Comment: daily   Sexual activity: Not on file  Other Topics Concern   Not on file  Social History Narrative   Not on file   Social Drivers of Health   Financial Resource Strain: Not on file  Food Insecurity: No Food Insecurity (02/04/2023)   Hunger Vital Sign    Worried About Running Out of Food in the Last Year: Never true    Ran Out of Food in the Last Year: Never true  Transportation Needs: No Transportation Needs (02/04/2023)   PRAPARE - Administrator, Civil Service (Medical): No    Lack of Transportation (Non-Medical): No  Physical Activity: Not on file  Stress: Not on file  Social Connections: Not on file  Intimate Partner Violence: Patient Unable To Answer (02/04/2023)    Humiliation, Afraid, Rape, and Kick questionnaire    Fear of Current or Ex-Partner: Patient unable to answer    Emotionally Abused: Patient unable to answer    Physically Abused: Patient unable to answer    Sexually Abused: Patient unable to answer    Family History:  History reviewed. No pertinent family history.   ROS:  Please see the history of present illness.  All other ROS reviewed and negative.     Physical Exam/Data:   Vitals:   02/08/23 1941 02/08/23 2208 02/09/23 0511 02/09/23 0803  BP: (!) 148/94 (!) 157/101 (!) 151/92 132/77  Pulse: 88 87 87 83  Resp: 16 19 17 18   Temp: 97.7 F (36.5 C) (!) 97.5 F (36.4 C) 97.6 F (36.4 C) 98.7 F (37.1 C)  TempSrc: Oral Axillary Axillary   SpO2: 100% 100% 100%   Weight:      Height:        Intake/Output Summary (Last 24 hours) at 02/09/2023 1255 Last data filed at 02/08/2023 1518 Gross per 24 hour  Intake 429.51 ml  Output --  Net 429.51 ml      02/03/2023    6:00 PM 06/03/2018   11:23 AM  Last 3 Weights  Weight (lbs) 127 lb 6.8 oz 145 lb 8.1 oz  Weight (kg) 57.8 kg 66 kg     Body mass index is 20.57 kg/m.  General: Frail chronically ill-appearing male, in no acute distress HEENT: normal for age but with poor dentition Neck: JVD 10 centimeters Vascular: No carotid bruits; Distal pulses 2+ bilaterally Cardiac:  normal S1, S2; RRR; no murmur  Lungs:  clear to auscultation bilaterally, no wheezing, or rales  Has some upper airway rhonchi Abd: soft, nontender, no hepatomegaly  Ext: no edema Musculoskeletal:  No deformities, BUE and BLE strength normal and equal Skin: warm and dry  Neuro:  CNs 2-12 intact, no focal abnormalities noted Psych:  Normal affect   EKG:  The EKG was personally reviewed and demonstrates: 01/07 ECG is sinus tach, heart rate 118, incomplete RBBB, complex morphology is similar to 12/09/2022 ECG Telemetry: Not on telemetry   Relevant CV Studies:  ECHO: 02/06/2023  1. Left ventricular ejection  fraction, by estimation, is 55%. The left ventricle has normal function. The left ventricle has no regional wall motion abnormalities. There is moderate concentric left ventricular hypertrophy. Left ventricular diastolic  parameters are consistent with Grade II diastolic dysfunction  (pseudonormalization).   2. D-shaped septum suggestive of RV pressure/volume overload. Right ventricular systolic function is severely reduced. The right ventricular size is severely enlarged. Mildly increased right ventricular wall thickness. There is moderately elevated  pulmonary artery systolic pressure. The estimated right ventricular systolic pressure is 54.5 mmHg.   3. Right atrial size was mildly dilated.   4. The mitral valve is normal in structure. Trivial mitral valve  regurgitation. No evidence of mitral stenosis.   5. Tricuspid valve regurgitation is mild to moderate.   6. The aortic valve is tricuspid. There is moderate calcification of the aortic valve. Aortic valve regurgitation is trivial. No aortic stenosis is present.   7. The inferior vena cava is normal in size with <50% respiratory variability, suggesting right atrial pressure of 8 mmHg.   8. A small pericardial effusion is present.   9. Cannot rule out valvular vegetation with bacteremia but nothing definitive visualized. The aortic valve is thickened/calcified.    Laboratory Data:  High Sensitivity Troponin:  No results for input(s): TROPONINIHS in the last 720 hours.   Chemistry Recent Labs  Lab 02/05/23 1226 02/07/23 1144 02/08/23 1119 02/09/23 0505  NA 133* 131* 131* 131*  K 3.8 4.1 4.4 4.3  CL 109 108 108 108  CO2 15* 16* 17* 18*  GLUCOSE 85 116* 96 82  BUN 16 9 11 12   CREATININE 1.06 0.96 0.94 1.14  CALCIUM  7.8* 8.0* 7.8* 7.7*  MG 2.0  --   --   --   GFRNONAA >60 >60 >60 >60  ANIONGAP 9 7 6 5     Recent Labs  Lab 02/03/23 1013 02/07/23 1144  PROT 10.6* 9.3*  ALBUMIN  2.2* 2.0*  AST 85* 149*  ALT 24 43  ALKPHOS 80  77  BILITOT 2.9* 1.6*   Lipids  Recent Labs  Lab 02/05/23 1226  CHOL 120  TRIG 136  HDL 14*  LDLCALC 79  CHOLHDL 8.6    Hematology Recent Labs  Lab 02/07/23 1144 02/08/23 1119 02/09/23 0505  WBC 3.5* 5.6 4.1  RBC 4.45 4.30 3.88*  HGB 15.5 14.3 13.0  HCT 44.0 42.1 38.6*  MCV 98.9 97.9 99.5  MCH 34.8* 33.3 33.5  MCHC 35.2 34.0 33.7  RDW 13.2 13.7 13.8  PLT 48* 57* 56*   Thyroid  Recent Labs  Lab 02/04/23 0529  TSH 4.140    BNPNo results for input(s): BNP, PROBNP in the last 168 hours.  DDimer No results for input(s): DDIMER in the last 168 hours.  No results found for: INR, PROTIME   Radiology/Studies:  ECHOCARDIOGRAM COMPLETE Result Date: 02/06/2023    ECHOCARDIOGRAM REPORT   Patient Name:   ROBI DEWOLFE Cosgriff Date of Exam: 02/06/2023 Medical Rec #:  [id number]     Height:       66.0 in Accession #:    [id number]    Weight:       127.4 lb Date of Birth:  10-19-1965     BSA:          1.651 m Patient Age:    57 years      BP:           145/92 mmHg Patient Gender: M             HR:           80 bpm. Exam Location:  Inpatient Procedure: 2D Echo, Cardiac Doppler and Color Doppler Indications:    bacteremia  History:        Patient has no prior history of Echocardiogram examinations.  Sonographer:    Lanell Maduro Referring Phys: [id number] CORNELIUS N VAN DAM IMPRESSIONS  1. Left ventricular ejection fraction, by estimation, is 55%. The left ventricle  has normal function. The left ventricle has no regional wall motion abnormalities. There is moderate concentric left ventricular hypertrophy. Left ventricular diastolic parameters are consistent with Grade II diastolic dysfunction (pseudonormalization).  2. D-shaped septum suggestive of RV pressure/volume overload. Right ventricular systolic function is severely reduced. The right ventricular size is severely enlarged. Mildly increased right ventricular wall thickness. There is moderately elevated pulmonary artery systolic pressure.  The estimated right ventricular systolic pressure is 54.5 mmHg.  3. Right atrial size was mildly dilated.  4. The mitral valve is normal in structure. Trivial mitral valve regurgitation. No evidence of mitral stenosis.  5. Tricuspid valve regurgitation is mild to moderate.  6. The aortic valve is tricuspid. There is moderate calcification of the aortic valve. Aortic valve regurgitation is trivial. No aortic stenosis is present.  7. The inferior vena cava is normal in size with <50% respiratory variability, suggesting right atrial pressure of 8 mmHg.  8. A small pericardial effusion is present.  9. Cannot rule out valvular vegetation with bacteremia but nothing definitive visualized. The aortic valve is thickened/calcified. FINDINGS  Left Ventricle: Left ventricular ejection fraction, by estimation, is 55%. The left ventricle has normal function. The left ventricle has no regional wall motion abnormalities. The left ventricular internal cavity size was normal in size. There is moderate concentric left ventricular hypertrophy. Left ventricular diastolic parameters are consistent with Grade II diastolic dysfunction (pseudonormalization). Right Ventricle: D-shaped septum suggestive of RV pressure/volume overload. The right ventricular size is severely enlarged. Mildly increased right ventricular wall thickness. Right ventricular systolic function is severely reduced. There is moderately elevated pulmonary artery systolic pressure. The tricuspid regurgitant velocity is 3.41 m/s, and with an assumed right atrial pressure of 8 mmHg, the estimated right ventricular systolic pressure is 54.5 mmHg. Left Atrium: Left atrial size was normal in size. Right Atrium: Right atrial size was mildly dilated. Pericardium: A small pericardial effusion is present. Mitral Valve: The mitral valve is normal in structure. Trivial mitral valve regurgitation. No evidence of mitral valve stenosis. Tricuspid Valve: The tricuspid valve is normal  in structure. Tricuspid valve regurgitation is mild to moderate. Aortic Valve: The aortic valve is tricuspid. There is moderate calcification of the aortic valve. Aortic valve regurgitation is trivial. No aortic stenosis is present. Pulmonic Valve: The pulmonic valve was normal in structure. Pulmonic valve regurgitation is not visualized. Aorta: The aortic root is normal in size and structure. Venous: The inferior vena cava is normal in size with less than 50% respiratory variability, suggesting right atrial pressure of 8 mmHg. IAS/Shunts: No atrial level shunt detected by color flow Doppler.  LEFT VENTRICLE PLAX 2D LVIDd:         3.00 cm     Diastology LVIDs:         1.70 cm     LV e' medial:    5.13 cm/s LV PW:         1.30 cm     LV E/e' medial:  14.0 LV IVS:        1.60 cm     LV e' lateral:   5.44 cm/s LVOT diam:     1.90 cm     LV E/e' lateral: 13.2 LV SV:         40 LV SV Index:   24 LVOT Area:     2.84 cm  LV Volumes (MOD) LV vol d, MOD A4C: 31.4 ml LV vol s, MOD A4C: 14.6 ml LV SV MOD A4C:  31.4 ml RIGHT VENTRICLE            IVC RV Basal diam:  5.40 cm    IVC diam: 1.40 cm RV Mid diam:    4.80 cm RV S prime:     6.53 cm/s TAPSE (M-mode): 1.5 cm LEFT ATRIUM           Index       RIGHT ATRIUM           Index LA diam:      3.10 cm 1.88 cm/m  RA Area:     18.80 cm LA Vol (A4C): 15.8 ml 9.57 ml/m  RA Volume:   54.90 ml  33.25 ml/m  AORTIC VALVE LVOT Vmax:   53.50 cm/s LVOT Vmean:  38.200 cm/s LVOT VTI:    0.140 m  AORTA Ao Root diam: 2.90 cm Ao Asc diam:  3.20 cm MITRAL VALVE               TRICUSPID VALVE MV Area (PHT): 4.49 cm    TR Peak grad:   46.5 mmHg MV Decel Time: 169 msec    TR Vmax:        341.00 cm/s MV E velocity: 71.60 cm/s MV A velocity: 60.00 cm/s  SHUNTS MV E/A ratio:  1.19        Systemic VTI:  0.14 m                            Systemic Diam: 1.90 cm Dalton McleanMD Electronically signed by Ezra Kanner Signature Date/Time: 02/06/2023/8:48:46 AM    Final    MR BRAIN W WO  CONTRAST Result Date: 02/05/2023 CLINICAL DATA:  Mental status change, unknown cause bacteremia, new HIV diagnosis, persistent encephalopathy EXAM: MRI HEAD WITHOUT AND WITH CONTRAST TECHNIQUE: Multiplanar, multiecho pulse sequences of the brain and surrounding structures were obtained without and with intravenous contrast. CONTRAST:  5mL GADAVIST  GADOBUTROL  1 MMOL/ML IV SOLN COMPARISON:  CT head 02/03/2023. FINDINGS: Motion limited study.  Within this limitation: Brain: No acute infarction, hemorrhage, hydrocephalus, extra-axial collection or mass lesion. No pathologic enhancement. Age advanced cerebral atrophy. Vascular: Major arterial flow voids are maintained at the skull base. Skull and upper cervical spine: Normal marrow signal. Sinuses/Orbits: Clear sinuses.  No acute orbital findings. Other: Trace bilateral mastoid effusions. IMPRESSION: 1. Motion limited study without evidence of acute intracranial abnormality. 2. Age advanced cerebral atrophy (ICD10-G31.9). Electronically Signed   By: Gilmore GORMAN Molt M.D.   On: 02/05/2023 14:33     Assessment and Plan:   Abnormal echo w/ RV dysfunction and elevated RVSP -BNP was not checked, but his CT on 01/07 did not show any edema, and chest x-ray did not show any acute cardiopulmonary abnormality - major pulm arteries were clear on CT on admit - O2 sats are normal and pt was initially tachycardic, but HR is controlled w/out rate-lowering meds - he has risk factors for PE, that include HIV dz of unknown duration.  - renal function nl, if CT PE protocol needed - otherwise, he would be WHO group I.   Risk Assessment/Risk Scores:     For questions or updates, please contact [facility] Please consult [email or web address] for contact info under  Signed, Shona Shad, PA-C  02/09/2023 12:55 PM As above, patient seen and examined.  Briefly he is a 58 year old male with past medical history of substance abuse including cocaine and alcohol, admitted  with encephalopathy, pneumonia/pneumococcal sepsis, newly diagnosed  HIV, hepatitis B positive for evaluation of right heart failure/pulmonary hypertension.  Patient has significant encephalopathy.  At time of my evaluation he knows his name but does not know the name of the hospital, year, month or his age.  He is felt to have HIV encephalopathy.  At present he denies dyspnea, chest pain, palpitations, pedal edema or syncope.  Patient is not volume overloaded on examination.  Chest/abdominal/pelvis CT (not PE protocol) showed pneumonia.  Brain MRI shows advanced cerebral atrophy.  Sodium 131, potassium 4.3, creatinine 1.14, AST 149, albumin  2, hemoglobin 13, platelet count 56, reactive fourth-generation HIV screen with CD4 count 116, positive hepatitis B surface antibody, reactive hepatitis C antibody, reactive RPR.  Blood cultures positive for strep pneumonia.  Electrocardiogram shows sinus tachycardia, incomplete right bundle branch block, right axis deviation.  Echocardiogram January 2025 personally reviewed and showed normal LV function, moderate left ventricular hypertrophy, grade 2 diastolic dysfunction, D-shaped septum suggestive of RV pressure/volume overload, severe RV dysfunction, severe right ventricular enlargement, moderate pulmonary hypertension, mild right atrial enlargement, mild to moderate tricuspid regurgitation, trace aortic insufficiency and small pericardial effusion.  1 pulmonary hypertension/RV dysfunction-most likely etiology is HIV with possible contribution from history of substance abuse.  Will also check ANA to screen for rheumatologic disorders.  He is not volume overloaded on examination and I therefore think left heart failure is unlikely.  Will arrange CTA to rule out pulmonary embolus and VQ scan to rule out chronic thromboembolic disease.  If abnormal he would benefit from anticoagulation.  The CT should also help better assess lung parenchyma.  I do not think he can cooperate  with pulmonary function tests.  Given his overall health status/comorbidities I do not think he is a good candidate for aggressive evaluation such as right heart catheterization.  He is also not in heart failure on examination.  If above testing is unrevealing I would suggest treating his HIV.  2 HIV/HIV dementia/pneumococcal pneumonia/bacteremia/RPR positive-Per infectious disease.  3 history of substance abuse  Redell Shallow, MD

## 2023-02-09 NOTE — Plan of Care (Signed)

## 2023-02-09 NOTE — Progress Notes (Signed)
 Summary: Russell [name] is a 59 y.o. male with remote history of cocaine use disorder and housing instability presenting with deteriorating mental status and admitted for septic encephalopathy.  Hospital day: 6  Subjective:  Overnight Events: Team was consulted after patient was found sitting on the floor reporting he fell. Night team deferred head imaging given no change in mentation or neurological deficits. See Dr. Rosezetta note for more information.   Patient seen and examined while resting in bed. Patient reports he is good. He does not report falling last night. He denies chest pain, SOB or abdominal pain. Patient denies hx of sores in mouth, sores on penis or difficulty swallowing.   Objective:  Vital signs in last 24 hours: Vitals:   02/08/23 1941 02/08/23 2208 02/09/23 0511 02/09/23 0803  BP: (!) 148/94 (!) 157/101 (!) 151/92 132/77  Pulse: 88 87 87 83  Resp: 16 19 17 18   Temp: 97.7 F (36.5 C) (!) 97.5 F (36.4 C) 97.6 F (36.4 C) 98.7 F (37.1 C)  TempSrc: Oral Axillary Axillary   SpO2: 100% 100% 100%   Weight:      Height:       Physical Exam General: NAD, pleasant, cooperative Cardiovascular: RRR, no murmurs, rubs or gallops Respiratory: normal work of breathing, clear to auscultation bilaterally Abdominal: soft, normal bowel sounds, no tenderness to palpation Skin: warm, dry; hyperpigmented macules on hands and feet Extremities: able to move all four extremities Neurological: alert, oriented to person and place  Weight change:   Intake/Output Summary (Last 24 hours) at 02/09/2023 1148 Last data filed at 02/08/2023 1518 Gross per 24 hour  Intake 429.51 ml  Output --  Net 429.51 ml      Latest Ref Rng & Units 02/09/2023    5:05 AM 02/08/2023   11:19 AM 02/07/2023   11:44 AM  CBC  WBC 4.0 - 10.5 K/uL 4.1  5.6  3.5   Hemoglobin 13.0 - 17.0 g/dL 86.9  85.6  84.4   Hematocrit 39.0 - 52.0 % 38.6  42.1  44.0   Platelets 150 - 400 K/uL 56  57  48        Latest Ref Rng & Units 02/09/2023    5:05 AM 02/08/2023   11:19 AM 02/07/2023   11:44 AM  CMP  Glucose 70 - 99 mg/dL 82  96  883   BUN 6 - 20 mg/dL 12  11  9    Creatinine 0.61 - 1.24 mg/dL 8.85  9.05  9.03   Sodium 135 - 145 mmol/L 131  131  131   Potassium 3.5 - 5.1 mmol/L 4.3  4.4  4.1   Chloride 98 - 111 mmol/L 108  108  108   CO2 22 - 32 mmol/L 18  17  16    Calcium  8.9 - 10.3 mg/dL 7.7  7.8  8.0   Total Protein 6.5 - 8.1 g/dL   9.3   Total Bilirubin 0.0 - 1.2 mg/dL   1.6   Alkaline Phos 38 - 126 U/L   77   AST 15 - 41 U/L   149   ALT 0 - 44 U/L   43    Fib-4 Score (Labs from 1/11): 27.93 (Advanced Fibrosis)  Assessment/Plan:  Principal Problem:   Pneumococcal bacteremia Active Problems:   Sepsis (HCC)   Fall   Encephalopathy   Community acquired pneumonia   Thrombocytopenia (HCC)   Hematuria   Homelessness   Alcoholism (HCC)   AIDS (acquired immune  deficiency syndrome) (HCC)   Positive RPR test   Cholelithiasis without cholecystitis   Aortic atherosclerosis (HCC)   Cerebral atrophy (HCC)   Horseshoe kidney   Malnutrition of moderate degree   HIV dementia (HCC)   HCV (hepatitis C virus)   Pulmonary hypertension (HCC)   Right ventricular systolic dysfunction   Biological false positive RPR test  Acute encephalopathy  Severe sepsis 2/2 pneumococcal bacteremia CAP Patient's encephalopathy most likely 2/2 pneumococcal bacteremia; however, given patient had positive RPR with 1:2 titer and has hyperpigmented macules on hands and feet, concern for secondary syphilis. T. Pallidium antibody non-reactive. ID consulted to see if current penicillin  dose will cover secondary syphilis. They report amox is for strep pneumo coverage and do not recommend syphilis coverage due to confirmatory testing negative. Additionally, team called syphilis hotline to ask if patient has ever been treated for syphilis in the past. They report they do not have any records of syphilis for him. Given  patient's blood cultures have remained negative, do not think patient needs a TEE at this time.  - ID following, appreciate recommendations - Continue IV penicillin  day 7/7   AIDS HIV Dementia  Cerebral Atrophy  MRI shows age advanced cerebral atrophy. Concerned patient's mentation may be at baseline. He remained oriented to self and place. Psychiatry evaluated him and determined patient does not have capacity to make medical decisions.  - Continue Biktarvy , DS Bactrim  daily for PJP prophylaxis  - Continue Crestor  5 mg - Delirium precautions - Team consulted TOC yesterday for starting legal guardianship process - OT recommends SNF placement   HTN Patient hypertensive overnight with normal BP this AM (132/77). Did not require IV labetalol  overnight.  - Continue to monitor BP - Continue amlodipine  5 mg for chronic BP control   HCV  Patient positive for Hep C with viral load of 19,300,000. Concerned if patient continues to be encephalopathic, may have additional hepatic encephalopathy. Fib-4 score revealing 27.93 indicated advance fibrosis. Will ordered RUQ U/S to evaluate.  - RUQ U/S  Thrombocytopenia Platelets 56 today from 57 yesterday. Stable. Continue to suspect thrombocytopenia 2/2 to Hep C and HIV.   Severely reduced right ventricular systolic function Pulmonary Artery HTN Patient appears to be euvolemic on exam. Recommend cardiology consult while patient is inpatient to assess if RHC is warranted at this time.  - Cardiology consulted and agreed to see, appreciate recommendations  Decreased Bowel Movements Patient reports not having a bowel movement for a few days. Will start lactulose . - Lactulose  10 mg TID  VTE Prophylaxis: SCDs Diet: Regular  IVF: N/A Anticipated Discharge Location: SNF  Dispo: Anticipated discharge in approximately 1-2 day(s).    LOS: 6 days   Saunders Damien MATSU, Medical Student 02/09/2023, 11:48 AM

## 2023-02-09 NOTE — Progress Notes (Signed)
 [facility]   Reason for visit: Follow up on pneumococcal bacteremia and a new diagnosis of HIV.  Interval History: He has been seen by psychiatry and does not have capacity for decision-making.  He has no complaints today.   Physical Exam: Constitutional:  Vitals:   02/09/23 0511 02/09/23 0803  BP: (!) 151/92 132/77  Pulse: 87 83  Resp: 17 18  Temp: 97.6 F (36.4 C) 98.7 F (37.1 C)  SpO2: 100%    patient appears in NAD Respiratory: Normal respiratory effort  Review of Systems: Constitutional: negative for fevers and chills  Lab Results  Component Value Date   WBC 4.1 02/09/2023   HGB 13.0 02/09/2023   HCT 38.6 (L) 02/09/2023   MCV 99.5 02/09/2023   PLT 56 (L) 02/09/2023    Lab Results  Component Value Date   CREATININE 1.14 02/09/2023   BUN 12 02/09/2023   NA 131 (L) 02/09/2023   K 4.3 02/09/2023   CL 108 02/09/2023   CO2 18 (L) 02/09/2023    Lab Results  Component Value Date   ALT 43 02/07/2023   AST 149 (H) 02/07/2023   ALKPHOS 77 02/07/2023     Microbiology: Recent Results (from the past 240 hours)  Blood culture (routine x 2)     Status: None   Collection Time: 02/03/23 10:40 AM   Specimen: BLOOD LEFT FOREARM  Result Value Ref Range Status   Specimen Description BLOOD LEFT FOREARM  Final   Special Requests   Final    BOTTLES DRAWN AEROBIC AND ANAEROBIC Blood Culture adequate volume   Culture   Final    NO GROWTH 5 DAYS Performed at [facility], [address], [place], [place] [address]    Report Status 02/08/2023 FINAL  Final  Blood culture (routine x 2)     Status: Abnormal   Collection Time: 02/03/23 10:45 AM   Specimen: BLOOD  Result Value Ref Range Status   Specimen Description BLOOD RIGHT ANTECUBITAL  Final   Special Requests   Final    BOTTLES DRAWN AEROBIC AND ANAEROBIC Blood Culture results may not be optimal due to an inadequate volume of blood received in culture bottles   Culture  Setup Time    Final    GRAM POSITIVE COCCI IN PAIRS AEROBIC BOTTLE ONLY CRITICAL RESULT CALLED TO, READ BACK BY AND VERIFIED WITH: V BRYK,PHARMD@0420  02/04/23 MK Performed at [facility], [address] [place], [place] [address]    Culture STREPTOCOCCUS PNEUMONIAE (A)  Final   Report Status 02/06/2023 FINAL  Final   Organism ID, Bacteria STREPTOCOCCUS PNEUMONIAE  Final      Susceptibility   Streptococcus pneumoniae - MIC*    ERYTHROMYCIN  <=0.12 SENSITIVE Sensitive     LEVOFLOXACIN 1 SENSITIVE Sensitive     VANCOMYCIN  0.5 SENSITIVE Sensitive     PENICILLIN  (meningitis) <=0.06 SENSITIVE Sensitive     PENO - penicillin  <=0.06      PENICILLIN  (non-meningitis) <=0.06 SENSITIVE Sensitive     PENICILLIN  (oral) <=0.06 SENSITIVE Sensitive     CEFTRIAXONE  (non-meningitis) <=0.12 SENSITIVE Sensitive     CEFTRIAXONE  (meningitis) <=0.12 SENSITIVE Sensitive     * STREPTOCOCCUS PNEUMONIAE  Blood Culture ID Panel (Reflexed)     Status: Abnormal   Collection Time: 02/03/23 10:45 AM  Result Value Ref Range Status   Enterococcus faecalis NOT DETECTED NOT DETECTED Final   Enterococcus Faecium NOT DETECTED NOT DETECTED Final   Listeria monocytogenes NOT  DETECTED NOT DETECTED Final   Staphylococcus species NOT DETECTED NOT DETECTED Final   Staphylococcus aureus (BCID) NOT DETECTED NOT DETECTED Final   Staphylococcus epidermidis NOT DETECTED NOT DETECTED Final   Staphylococcus lugdunensis NOT DETECTED NOT DETECTED Final   Streptococcus species DETECTED (A) NOT DETECTED Final    Comment: CRITICAL RESULT CALLED TO, READ BACK BY AND VERIFIED WITH: V BRYK,PHARMD@0420  02/04/23 MK    Streptococcus agalactiae NOT DETECTED NOT DETECTED Final   Streptococcus pneumoniae DETECTED (A) NOT DETECTED Final    Comment: CRITICAL RESULT CALLED TO, READ BACK BY AND VERIFIED WITH: V BRYK,PHARMD@0420  02/04/23 MK    Streptococcus pyogenes NOT DETECTED NOT DETECTED Final   A.calcoaceticus-baumannii NOT DETECTED NOT DETECTED  Final   Bacteroides fragilis NOT DETECTED NOT DETECTED Final   Enterobacterales NOT DETECTED NOT DETECTED Final   Enterobacter cloacae complex NOT DETECTED NOT DETECTED Final   Escherichia coli NOT DETECTED NOT DETECTED Final   Klebsiella aerogenes NOT DETECTED NOT DETECTED Final   Klebsiella oxytoca NOT DETECTED NOT DETECTED Final   Klebsiella pneumoniae NOT DETECTED NOT DETECTED Final   Proteus species NOT DETECTED NOT DETECTED Final   Salmonella species NOT DETECTED NOT DETECTED Final   Serratia marcescens NOT DETECTED NOT DETECTED Final   Haemophilus influenzae NOT DETECTED NOT DETECTED Final   Neisseria meningitidis NOT DETECTED NOT DETECTED Final   Pseudomonas aeruginosa NOT DETECTED NOT DETECTED Final   Stenotrophomonas maltophilia NOT DETECTED NOT DETECTED Final   Candida albicans NOT DETECTED NOT DETECTED Final   Candida auris NOT DETECTED NOT DETECTED Final   Candida glabrata NOT DETECTED NOT DETECTED Final   Candida krusei NOT DETECTED NOT DETECTED Final   Candida parapsilosis NOT DETECTED NOT DETECTED Final   Candida tropicalis NOT DETECTED NOT DETECTED Final   Cryptococcus neoformans/gattii NOT DETECTED NOT DETECTED Final    Comment: Performed at [facility], [address] [place], [place] [address]  Culture, blood (Routine X 2) w Reflex to ID Panel     Status: None (Preliminary result)   Collection Time: 02/05/23  5:03 PM   Specimen: BLOOD LEFT ARM  Result Value Ref Range Status   Specimen Description BLOOD LEFT ARM  Final   Special Requests   Final    BOTTLES DRAWN AEROBIC ONLY Blood Culture results may not be optimal due to an inadequate volume of blood received in culture bottles   Culture   Final    NO GROWTH 4 DAYS Performed at [facility], [address], [place], [place] [address]    Report Status PENDING  Incomplete  Culture, blood (Routine X 2) w Reflex to ID Panel     Status: None (Preliminary result)   Collection Time: 02/05/23  5:03 PM    Specimen: BLOOD  Result Value Ref Range Status   Specimen Description BLOOD SITE NOT SPECIFIED  Final   Special Requests   Final    BOTTLES DRAWN AEROBIC ONLY Blood Culture results may not be optimal due to an inadequate volume of blood received in culture bottles   Culture   Final    NO GROWTH 4 DAYS Performed at [facility], [address], [place], [place] [address]    Report Status PENDING  Incomplete    Impression/Plan:  1.  Pneumococcal bacteremia with pneumonia.  He has opacities on CT scan and bacteremia with pneumococcal pneumoniae discerning for pneumococcal pneumonia.  He is on penicillin  and has been on it for 7 days.  He  is otherwise afebrile and not on any oxygen.  Will transition him to oral amoxicillin .  Follow-up blood cultures are no growth now at 4 days.  Plan to treat for another 7 days.  2.  HIV.  This is a new diagnosis for him and he has been started on Biktarvy  and is having no issues tolerating that.  He will remain on this at discharge.  CD4 noted and is 116.  Discussed again with him though in discussion with nursing and other providers it does seem he has little insight.  Hopefully will be going to a SNF for follow-up care and continue to convalesce and get his medications.  Will engage a bridge counselors as well.  3.  Opportunistic infection risk.  Low CD4 count and he has been started on Bactrim  for pneumocystis prophylaxis.  He will continue with this.  4.  Pulmonary hypertension.  Been seen by cardiology now with plan for further workup with a CT scan.  5.  Dementia.  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  This will likely not improve but hopefully with treatment can reduce the progression of this.  6.  Chronic hepatitis C.  He has a positive viral load and will need ongoing management of this including treatment  7.  Thrombocytopenia.  This could be related to his underlying uncontrolled  HIV versus liver disease from his hepatitis C.  CT scan of his abdomen did not suggest any cirrhosis.  Will continue to monitor

## 2023-02-09 NOTE — NC FL2 (Signed)
 [place]  MEDICAID FL2 LEVEL OF CARE FORM     IDENTIFICATION  Patient Name: Russell [name] Birthdate: 1965/05/04 Sex: male Admission Date (Current Location): 02/03/2023  [facility] and [place] Number:  Producer, Television/film/video and Address:  [facility], [address] [place], [place] [address]      Provider Number: [id number]  Attending Physician Name and Address:  Eben Reyes BROCKS, MD  Relative Name and Phone Number:  [name], [name] (Sister)  [phone]    Current Level of Care: Hospital Recommended Level of Care: Skilled Nursing Facility Prior Approval Number:    Date Approved/Denied:   PASRR Number: [id number]  Discharge Plan: SNF    Current Diagnoses: Patient Active Problem List   Diagnosis Date Noted   HCV (hepatitis C virus) 02/06/2023   Pulmonary hypertension (HCC) 02/06/2023   Right ventricular systolic dysfunction 02/06/2023   Biological false positive RPR test 02/06/2023   Malnutrition of moderate degree 02/05/2023   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   Homelessness 02/04/2023   Alcoholism (HCC) 02/04/2023   AIDS (acquired immune deficiency syndrome) (HCC) 02/04/2023   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/2023   Pneumococcal bacteremia 02/04/2023   Cholelithiasis without cholecystitis 02/04/2023   Aortic atherosclerosis (HCC) 02/04/2023   Cerebral atrophy (HCC) 02/04/2023   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   Sepsis (HCC) 02/03/2023   Fall 02/03/2023   Encephalopathy 02/03/2023   Community acquired pneumonia 02/03/2023   Thrombocytopenia (HCC) 02/03/2023   Hematuria 02/03/2023   Closed dislocation of right ankle 06/03/2018    Orientation RESPIRATION BLADDER Height & Weight     Self  Normal Incontinent Weight: 127 lb 6.8 oz (57.8 kg) Height:  5' 6 (167.6 cm)  BEHAVIORAL SYMPTOMS/MOOD NEUROLOGICAL BOWEL NUTRITION STATUS      Continent Diet (see DC summary)  AMBULATORY STATUS COMMUNICATION OF NEEDS Skin   Extensive Assist Verbally Normal                        Personal Care Assistance Level of Assistance  Bathing, Feeding, Dressing Bathing Assistance: Maximum assistance Feeding assistance: Limited assistance Dressing Assistance: Maximum assistance     Functional Limitations Info  Sight, Hearing, Speech Sight Info: Adequate Hearing Info: Adequate Speech Info: Adequate    SPECIAL CARE FACTORS FREQUENCY  PT (By licensed PT), OT (By licensed OT)     PT Frequency: 5x/week OT Frequency: 5x/week            Contractures Contractures Info: Not present    Additional Factors Info  Code Status, Allergies Code Status Info: FULL Allergies Info: No Known Allergies           Current Medications (02/09/2023):  This is the current hospital active medication list Current Facility-Administered Medications  Medication Dose Route Frequency Provider Last Rate Last Admin   amLODipine  (NORVASC ) tablet 5 mg  5 mg Oral Daily Bender, Emily, DO   5 mg at 02/09/23 9057   amoxicillin  (AMOXIL ) capsule 1,000 mg  1,000 mg Oral TID Comer, Robert W, MD       bictegravir-emtricitabine -tenofovir  AF (BIKTARVY ) 50-200-25 MG per tablet 1 tablet  1 tablet Oral Daily Fleeta Rothman, Jomarie SAILOR, MD   1 tablet at 02/09/23 9057   feeding supplement (ENSURE ENLIVE / ENSURE PLUS) liquid 237 mL  237 mL Oral BID BM Rosan Pride C, DO   237 mL at 02/09/23 0944   folic acid  (FOLVITE ) tablet 1 mg  1 mg Oral Daily McLendon, Michael, MD   1 mg at  02/09/23 0942   multivitamin with minerals tablet 1 tablet  1 tablet Oral Daily McLendon, Michael, MD   1 tablet at 02/09/23 9057   rosuvastatin  (CRESTOR ) tablet 5 mg  5 mg Oral Daily Bender, Damien, DO   5 mg at 02/09/23 0942   sodium chloride  flush (NS) 0.9 % injection 3 mL  3 mL Intravenous Q12H Norrine Sharper, MD   3 mL at 02/09/23 9056   sodium chloride  flush (NS) 0.9 % injection 3 mL  3 mL Intravenous PRN McLendon, Michael, MD       sulfamethoxazole -trimethoprim  (BACTRIM  DS) 800-160 MG per tablet 1 tablet  1 tablet  Oral Daily Kandis Damien, DO   1 tablet at 02/09/23 9057   thiamine  (VITAMIN B1) tablet 100 mg  100 mg Oral Daily McLendon, Michael, MD   100 mg at 02/08/23 9078   Or   thiamine  (VITAMIN B1) injection 100 mg  100 mg Intravenous Daily McLendon, Michael, MD   100 mg at 02/09/23 9058     Discharge Medications: Please see discharge summary for a list of discharge medications.  Relevant Imaging Results:  Relevant Lab Results:   Additional Information SSN: [id number]  [place] A Cystal Shannahan, LCSWA

## 2023-02-09 NOTE — Progress Notes (Addendum)
 Physical Therapy Treatment Patient Details Name: Russell [name] MRN: [id number] DOB: 1965/07/29 Today's Date: 02/09/2023   History of Present Illness Pt is a 58 yo male who presented to [facility] ED on 02/03/23 with a chief complaint of fall and AMS, Pt with pneumococcal bacteremia and found to be HIV+. Pt seen by psychiatry and found unable to make own decisions.  PMH remote cocaine use and alcohol use.    PT Comments  Pt's mobility improved since PT eval. However cognition remains poor. Will work toward improving mobility and improving cognition to improve independence. Patient will benefit from continued inpatient follow up therapy, <3 hours/day     If plan is discharge home, recommend the following: A lot of help with bathing/dressing/bathroom;Assistance with cooking/housework;Direct supervision/assist for medications management;Direct supervision/assist for financial management;Assist for transportation;Help with stairs or ramp for entrance;Supervision due to cognitive status;A little help with walking and/or transfers   Can travel by private vehicle     No  Equipment Recommendations  None recommended by PT    Recommendations for Other Services       Precautions / Restrictions Precautions Precautions: Fall Restrictions Weight Bearing Restrictions Per Provider Order: No     Mobility  Bed Mobility Overal bed mobility: Needs Assistance Bed Mobility: Sit to Supine       Sit to supine: Min assist   General bed mobility comments: Assist to initiate process of lying down.    Transfers Overall transfer level: Needs assistance Equipment used: 1 person hand held assist Transfers: Sit to/from Stand Sit to Stand: Min assist           General transfer comment: Assist for balance    Ambulation/Gait Ambulation/Gait assistance: Min assist Gait Distance (Feet): 300 Feet Assistive device: 1 person hand held assist Gait Pattern/deviations: Step-through pattern, Ataxic, Wide base of  support Gait velocity: decr Gait velocity interpretation: 1.31 - 2.62 ft/sec, indicative of limited community ambulator   General Gait Details: Assist for balance and support   Stairs             Wheelchair Mobility     Tilt Bed    Modified Rankin (Stroke Patients Only)       Balance Overall balance assessment: Needs assistance Sitting-balance support: No upper extremity supported, Feet supported Sitting balance-Leahy Scale: Good Sitting balance - Comments: Can reach forward/down to put shoes on   Standing balance support: No upper extremity supported, During functional activity Standing balance-Leahy Scale: Poor Standing balance comment: CGA for static standing at times                            Cognition Arousal: Alert Behavior During Therapy: Restless Overall Cognitive Status: No family/caregiver present to determine baseline cognitive functioning Area of Impairment: Orientation, Attention, Memory, Following commands, Safety/judgement, Awareness, Problem solving                 Orientation Level: Place, Time, Situation Current Attention Level: Sustained Memory: Decreased recall of precautions, Decreased short-term memory Following Commands: Follows one step commands inconsistently, Follows one step commands with increased time Safety/Judgement: Decreased awareness of safety, Decreased awareness of deficits Awareness: Intellectual Problem Solving: Slow processing, Decreased initiation, Difficulty sequencing, Requires verbal cues, Requires tactile cues General Comments: Difficulty processing simple functions like scoot you butt back and lie down.        Exercises      General Comments        Pertinent Vitals/Pain Pain Assessment  Pain Assessment: Faces Faces Pain Scale: No hurt    Home Living                          Prior Function            PT Goals (current goals can now be found in the care plan section)  Acute Rehab PT Goals Patient Stated Goal: none stated PT Goal Formulation: Patient unable to participate in goal setting Time For Goal Achievement: 02/23/23 Potential to Achieve Goals: Fair Progress towards PT goals: Goals met and updated - see care plan    Frequency    Min 1X/week      PT Plan      Co-evaluation              AM-PAC PT 6 Clicks Mobility   Outcome Measure  Help needed turning from your back to your side while in a flat bed without using bedrails?: A Little Help needed moving from lying on your back to sitting on the side of a flat bed without using bedrails?: A Little Help needed moving to and from a bed to a chair (including a wheelchair)?: A Little Help needed standing up from a chair using your arms (e.g., wheelchair or bedside chair)?: A Little Help needed to walk in hospital room?: A Little Help needed climbing 3-5 steps with a railing? : A Lot 6 Click Score: 17    End of Session Equipment Utilized During Treatment: Gait belt Activity Tolerance: Patient tolerated treatment well Patient left: in bed;with call bell/phone within reach;with bed alarm set (mat in place) Nurse Communication: Mobility status PT Visit Diagnosis: Unsteadiness on feet (R26.81);Muscle weakness (generalized) (M62.81);Difficulty in walking, not elsewhere classified (R26.2);Other symptoms and signs involving the nervous system (R29.898)     Time: 8276-8260 PT Time Calculation (min) (ACUTE ONLY): 16 min  Charges:    $Gait Training: 8-22 mins PT General Charges $$ ACUTE PT VISIT: 1 Visit                     [facility] PT Acute Rehabilitation Services Office [phone]    [facility] ORN [facility] 02/09/2023, 6:05 PM

## 2023-02-09 NOTE — Progress Notes (Signed)
 Patient had made several attempts to get out of bed and stating I want to go home when asked where he was going. He was constantly reoriented to his situation and current place and reassured that he would be able to go home as soon as he is cleared by the medical team. He was offered food and water /drink but he said he was neither hungry nor thirsty.  He was positioned in bed with siderails up and bed alarm turned on, call bell within his reach and floor mat at bedside. He was encouraged to call for help if he needed any form of assistance. Upon rushing to room after his bed alarm went off, he was found sitting up on the floor. When asked what happened he stated  I just fell, I am sorry. I feel alright He denied hitting his head and any pain. Patient was assisted back in bed and assessed, vital signs taken and provider and charge RN informed of incidence. Attempt was made to contact family but phone rang answered, voicemail was left detailing fall event.

## 2023-02-10 ENCOUNTER — Inpatient Hospital Stay (HOSPITAL_COMMUNITY): Payer: Commercial Managed Care - HMO

## 2023-02-10 DIAGNOSIS — B20 Human immunodeficiency virus [HIV] disease: Secondary | ICD-10-CM | POA: Diagnosis not present

## 2023-02-10 DIAGNOSIS — B1920 Unspecified viral hepatitis C without hepatic coma: Secondary | ICD-10-CM

## 2023-02-10 DIAGNOSIS — B351 Tinea unguium: Secondary | ICD-10-CM | POA: Diagnosis not present

## 2023-02-10 DIAGNOSIS — R7881 Bacteremia: Secondary | ICD-10-CM | POA: Diagnosis not present

## 2023-02-10 DIAGNOSIS — G319 Degenerative disease of nervous system, unspecified: Secondary | ICD-10-CM

## 2023-02-10 DIAGNOSIS — I272 Pulmonary hypertension, unspecified: Secondary | ICD-10-CM | POA: Diagnosis not present

## 2023-02-10 DIAGNOSIS — I10 Essential (primary) hypertension: Secondary | ICD-10-CM

## 2023-02-10 DIAGNOSIS — B182 Chronic viral hepatitis C: Secondary | ICD-10-CM | POA: Diagnosis not present

## 2023-02-10 LAB — CBC
HCT: 37.5 % — ABNORMAL LOW (ref 39.0–52.0)
Hemoglobin: 12.9 g/dL — ABNORMAL LOW (ref 13.0–17.0)
MCH: 34.1 pg — ABNORMAL HIGH (ref 26.0–34.0)
MCHC: 34.4 g/dL (ref 30.0–36.0)
MCV: 99.2 fL (ref 80.0–100.0)
Platelets: 62 10*3/uL — ABNORMAL LOW (ref 150–400)
RBC: 3.78 MIL/uL — ABNORMAL LOW (ref 4.22–5.81)
RDW: 14 % (ref 11.5–15.5)
WBC: 3.3 10*3/uL — ABNORMAL LOW (ref 4.0–10.5)
nRBC: 0 % (ref 0.0–0.2)

## 2023-02-10 LAB — BASIC METABOLIC PANEL
Anion gap: 6 (ref 5–15)
BUN: 10 mg/dL (ref 6–20)
CO2: 20 mmol/L — ABNORMAL LOW (ref 22–32)
Calcium: 8.1 mg/dL — ABNORMAL LOW (ref 8.9–10.3)
Chloride: 104 mmol/L (ref 98–111)
Creatinine, Ser: 1.13 mg/dL (ref 0.61–1.24)
GFR, Estimated: >60 mL/min (ref >60)
Glucose, Bld: 79 mg/dL (ref 70–99)
Potassium: 4 mmol/L (ref 3.5–5.1)
Sodium: 130 mmol/L — ABNORMAL LOW (ref 135–145)

## 2023-02-10 LAB — ANA W/REFLEX IF POSITIVE: Anti Nuclear Antibody (ANA): NEGATIVE

## 2023-02-10 LAB — CULTURE, BLOOD (ROUTINE X 2)
Culture: NO GROWTH
Culture: NO GROWTH

## 2023-02-10 MED ORDER — TECHNETIUM TO 99M ALBUMIN AGGREGATED
4.4000 | Freq: Once | INTRAVENOUS | Status: AC | PRN
  Administered 2023-02-10: 4.4 via INTRAVENOUS

## 2023-02-10 NOTE — Progress Notes (Signed)
 Nutrition Follow-up  DOCUMENTATION CODES:   Non-severe (moderate) malnutrition in context of chronic illness  INTERVENTION:  Continue regular diet Ensure Plus High Protein po BID, each supplement provides 350 kcal and 20 grams of protein. Multivitamin with minerals  NUTRITION DIAGNOSIS:   Moderate Malnutrition related to chronic illness as evidenced by estimated needs, moderate fat depletion, moderate muscle depletion.    GOAL:   Patient will meet greater than or equal to 90% of their needs    MONITOR:   PO intake, Supplement acceptance  REASON FOR ASSESSMENT:   Malnutrition Screening Tool    ASSESSMENT:   58 y.o. M, presented to ED for evaluation of altered mental status. Admitted with Pneumococcal bacteremia. PMH; remote history of ETOH and cocaine abuse.  Patient a little confused at time of visit.  Stated that he felt as though his appetite was improving. Had no other concerns for RD on this day.  Spoke with Patient's RN. He stated that his appetite is improving and that he is promoting ensures with good acceptance of them.  He did further state that patient continues to be a little confused. Patient is pending placement. Admit weight: 57.8 kg  Average Meal Intake no current documentation.  Nutritionally Relevant Medications: Scheduled Meds:  amLODipine   5 mg Oral Daily   feeding supplement  237 mL Oral BID BM   folic acid   1 mg Oral Daily   lactulose   10 g Oral TID   multivitamin with minerals  1 tablet Oral Daily   thiamine   100 mg Oral Daily   Or   thiamine   100 mg Intravenous Daily      Labs Reviewed    NUTRITION - FOCUSED PHYSICAL EXAM:  Flowsheet Row Most Recent Value  Orbital Region Moderate depletion  Upper Arm Region Moderate depletion  Thoracic and Lumbar Region Moderate depletion  Buccal Region Mild depletion  Temple Region Mild depletion  Clavicle Bone Region Moderate depletion  Clavicle and Acromion Bone Region Moderate depletion   Scapular Bone Region No depletion  Dorsal Hand Unable to assess  Anterior Thigh Region Mild depletion  Posterior Calf Region Mild depletion  Edema (RD Assessment) None  Hair Reviewed  Eyes Reviewed  Mouth Reviewed  Skin Reviewed  Nails Reviewed       Diet Order:   Diet Order             Diet regular Room service appropriate? No; Fluid consistency: Thin  Diet effective now                   EDUCATION NEEDS:   Not appropriate for education at this time  Skin:  Skin Assessment: Reviewed RN Assessment  Last BM:  1/14 type 4  Height:   Ht Readings from Last 1 Encounters:  02/03/23 5' 6 (1.676 m)    Weight:   Wt Readings from Last 1 Encounters:  02/03/23 57.8 kg    Ideal Body Weight:     BMI:  Body mass index is 20.57 kg/m.  Estimated Nutritional Needs:   Kcal:  1750-2050 kcal  Protein:  75-90 g  Fluid:  15ml/kcal    Jenna Pew RDN, LDN Clinical Dietitian   If unable to reach, please contact RD Inpatient secure chat group between 8 am-4 pm daily

## 2023-02-10 NOTE — Progress Notes (Signed)
 Mobility Specialist: Progress Note   02/10/23 1535  Mobility  Activity Ambulated with assistance to bathroom  Level of Assistance Standby assist, set-up cues, supervision of patient - no hands on  Assistive Device None  Distance Ambulated (ft) 20 ft  Activity Response Tolerated well  Mobility Referral Yes  Mobility visit 1 Mobility  Mobility Specialist Start Time (ACUTE ONLY) 1515  Mobility Specialist Stop Time (ACUTE ONLY) 1527  Mobility Specialist Time Calculation (min) (ACUTE ONLY) 12 min    Pt was agreeable to mobility session - received in bed. Requested to go to the BR - ambulated SV without fault. Declined hallway ambulation. Left in bed with all needs met, call bell in reach. Bed alarm on.   Ileana Lute Mobility Specialist Please contact via SecureChat or Rehab office at [phone]

## 2023-02-10 NOTE — Plan of Care (Signed)
 Pt alert x1-2, confused, x1 assistt to BR, pt/ot workijng with pt, reg diet, plan is SNF

## 2023-02-10 NOTE — Progress Notes (Signed)
 Rounding Note    Patient Name: Russell [name] Date of Encounter: 02/10/2023  [facility] Cardiologist: Redell Shallow, MD   Subjective   Confused; denies CP or dyspnea  Inpatient Medications    Scheduled Meds:  amLODipine   5 mg Oral Daily   bictegravir-emtricitabine -tenofovir  AF  1 tablet Oral Daily   feeding supplement  237 mL Oral BID BM   folic acid   1 mg Oral Daily   lactulose   10 g Oral TID   multivitamin with minerals  1 tablet Oral Daily   rosuvastatin   5 mg Oral Daily   sodium chloride  flush  3 mL Intravenous Q12H   sulfamethoxazole -trimethoprim   1 tablet Oral Daily   thiamine   100 mg Oral Daily   Or   thiamine   100 mg Intravenous Daily   Continuous Infusions:  PRN Meds: sodium chloride  flush   Vital Signs    Vitals:   02/09/23 2203 02/09/23 2210 02/10/23 0503 02/10/23 0800  BP: (!) 173/102 (!) 155/100 (!) 163/96 (!) 116/91  Pulse: 84  78 79  Resp: 17  16 18   Temp: 97.6 F (36.4 C)  98.1 F (36.7 C) 98 F (36.7 C)  TempSrc: Oral     SpO2: 100%  100% 100%  Weight:      Height:       No intake or output data in the 24 hours ending 02/10/23 0924    02/03/2023    6:00 PM 06/03/2018   11:23 AM  Last 3 Weights  Weight (lbs) 127 lb 6.8 oz 145 lb 8.1 oz  Weight (kg) 57.8 kg 66 kg      Telemetry    Sinus - Personally Reviewed   Physical Exam   GEN: No acute distress.  Chronically ill appearing Neck: No JVD Cardiac: RRR, no murmurs, rubs, or gallops.  Respiratory: Clear to auscultation bilaterally. GI: Soft, nontender, non-distended  MS: No edema Neuro:  Nonfocal  Psych: Normal affect   Labs    \ Chemistry Recent Labs  Lab 02/03/23 1013 02/04/23 0529 02/05/23 1226 02/07/23 1144 02/08/23 1119 02/09/23 0505 02/10/23 0505  NA 131*   < > 133* 131* 131* 131* 130*  K 4.0   < > 3.8 4.1 4.4 4.3 4.0  CL 106   < > 109 108 108 108 104  CO2 14*   < > 15* 16* 17* 18* 20*  GLUCOSE 108*   < > 85 116* 96 82 79  BUN 16   < > 16 9 11  12 10   CREATININE 1.45*   < > 1.06 0.96 0.94 1.14 1.13  CALCIUM  8.2*   < > 7.8* 8.0* 7.8* 7.7* 8.1*  MG  --   --  2.0  --   --   --   --   PROT 10.6*  --   --  9.3*  --   --   --   ALBUMIN  2.2*  --   --  2.0*  --   --   --   AST 85*  --   --  149*  --   --   --   ALT 24  --   --  43  --   --   --   ALKPHOS 80  --   --  77  --   --   --   BILITOT 2.9*  --   --  1.6*  --   --   --   GFRNONAA 56*   < > >  60 >60 >60 >60 >60  ANIONGAP 11   < > 9 7 6 5 6    < > = values in this interval not displayed.    Lipids  Recent Labs  Lab 02/05/23 1226  CHOL 120  TRIG 136  HDL 14*  LDLCALC 79  CHOLHDL 8.6    Hematology Recent Labs  Lab 02/08/23 1119 02/09/23 0505 02/10/23 0505  WBC 5.6 4.1 3.3*  RBC 4.30 3.88* 3.78*  HGB 14.3 13.0 12.9*  HCT 42.1 38.6* 37.5*  MCV 97.9 99.5 99.2  MCH 33.3 33.5 34.1*  MCHC 34.0 33.7 34.4  RDW 13.7 13.8 14.0  PLT 57* 56* 62*   Thyroid  Recent Labs  Lab 02/04/23 0529  TSH 4.140    BNP Recent Labs  Lab 02/09/23 1417  BNP 383.3*     Radiology    US  Abdomen Limited RUQ (LIVER/GB) Result Date: 02/09/2023 CLINICAL DATA:  Hepatitis, evaluate for cirrhosis EXAM: ULTRASOUND ABDOMEN LIMITED RIGHT UPPER QUADRANT COMPARISON:  CT abdomen/pelvis dated 02/03/2023 FINDINGS: Gallbladder: Layering small gallstones. 1.8 cm polypoid lesion in the gallbladder fundus with mild vascularity. No gallbladder wall thickening or pericholecystic fluid. Negative sonographic Murphy's sign. Common bile duct: Diameter: 3 mm Liver: Micronodular hepatic contour (image 20), raising the possibility of early cirrhosis. 1.2 x 1.6 x 1.4 cm hyperechoic lesion along the falciform ligament, favoring focal fat over a benign hemangioma. Portal vein is patent on color Doppler imaging with normal direction of blood flow towards the liver. Other: None. IMPRESSION: Possible early cirrhosis.  No suspicious hepatic lesion. Cholelithiasis, without sonographic findings to suggest acute cholecystitis.  1.8 cm polypoid lesion in the gallbladder fundus. This was poorly evaluated on prior CT due to motion degradation. Consider follow-up MRI abdomen with/without contrast in 3-4 weeks. Electronically Signed   By: Pinkie Pebbles M.D.   On: 02/09/2023 23:09   CT Angio Chest Pulmonary Embolism (PE) W or WO Contrast Result Date: 02/09/2023 CLINICAL DATA:  Pulmonary embolism (PE) suspected, high prob EXAM: CT ANGIOGRAPHY CHEST WITH CONTRAST TECHNIQUE: Multidetector CT imaging of the chest was performed using the standard protocol during bolus administration of intravenous contrast. Multiplanar CT image reconstructions and MIPs were obtained to evaluate the vascular anatomy. RADIATION DOSE REDUCTION: This exam was performed according to the departmental dose-optimization program which includes automated exposure control, adjustment of the mA and/or kV according to patient size and/or use of iterative reconstruction technique. CONTRAST:  75mL OMNIPAQUE  IOHEXOL  350 MG/ML SOLN COMPARISON:  Chest CT 02/03/2023 FINDINGS: Cardiovascular: There are no filling defects within the pulmonary arteries to suggest pulmonary embolus. The heart is normal in size. There are coronary artery calcifications. Small pericardial effusion measures up to 10 mm in depth anteriorly. 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 aortic atherosclerosis without aneurysm or acute aortic findings. Main pulmonary artery is dilated at 3.4 cm. Mediastinum/Nodes: Wall thickening of the distal esophagus. No mediastinal or hilar adenopathy. Lungs/Pleura: Improving left lower lobe aeration with decreasing ill-defined and masslike opacities. The previous masslike opacity is significantly improved with only faint residual nodular airspace disease, series 7, image 72. No new airspace disease. Trachea and central airways are clear. No pleural effusion. Upper  Abdomen: No acute findings. Musculoskeletal: There are no acute or suspicious osseous abnormalities. Review of the MIP images confirms the above findings. IMPRESSION: 1. No pulmonary embolus. 2. Dilated main pulmonary artery can be seen with pulmonary arterial hypertension.  3. Improving left lower lobe aeration with decreasing ill-defined and masslike opacities. The previous masslike opacity is significantly improved with only faint residual nodular airspace disease. Findings are consistent with resolving infection/inflammation. 4. Small pericardial effusion. 5. Wall thickening of the distal esophagus, can be seen with esophagitis. Aortic Atherosclerosis (ICD10-I70.0). Electronically Signed   By: Andrea Gasman M.D.   On: 02/09/2023 16:50     Patient Profile     58 year old male with past medical history of substance abuse including cocaine and alcohol, admitted with encephalopathy, pneumonia/pneumococcal sepsis, newly diagnosed HIV, hepatitis B positive for evaluation of right heart failure/pulmonary hypertension. He is felt to have HIV encephalopathy. Echocardiogram January 2025 personally reviewed and showed normal LV function, moderate left ventricular hypertrophy, grade 2 diastolic dysfunction, D-shaped septum suggestive of RV pressure/volume overload, severe RV dysfunction, severe right ventricular enlargement, moderate pulmonary hypertension, mild right atrial enlargement, mild to moderate tricuspid regurgitation, trace aortic insufficiency and small pericardial effusion.  CTA showed no pulmonary embolus; dilated pulmonary artery consistent with pulmonary hypertension.  Assessment & Plan    1 pulmonary hypertension/RV dysfunction-etiology of pulmonary hypertension likely newly diagnosed HIV.  ANA is pending.  CTA shows no pulmonary embolus.  VQ scan pending.  As outlined previously I do not think he can cooperate with pulmonary function testing.  Also given his comorbidities including HIV dementia  he is not a candidate for right heart catheterization or aggressive cardiac therapies.  Continue therapy for HIV.  Note he is not volume overloaded on examination.     2 HIV/HIV dementia/pneumococcal pneumonia/bacteremia/RPR positive/hepatitis C-Per infectious disease.   3 history of substance abuse  For questions or updates, please contact [facility] Please consult [email or web address] for contact info under        Signed, Redell Shallow, MD  02/10/2023, 9:24 AM

## 2023-02-10 NOTE — Plan of Care (Signed)

## 2023-02-10 NOTE — Progress Notes (Addendum)
 OT Cancellation Note  Patient Details Name: Russell [name] MRN: [id number] DOB: 1965-08-05   Cancelled Treatment:    Reason Eval/Treat Not Completed: Patient at procedure or test/ unavailable;Other (comment) (eating breakfast) pt greeted eating breakfast, pt politely declined OT session at this time. Will check back as time allows for session.  Returned at 1329 with pt off unit for testing, will continue efforts as time allows.   Russell Mallie POUR., COTA/L Acute Rehabilitation Services [phone]   Russell [name] 02/10/2023, 8:27 AM

## 2023-02-10 NOTE — Progress Notes (Signed)
 Mobility Specialist: Progress Note   02/10/23 0900  Mobility  Activity Dangled on edge of bed  Level of Assistance Standby assist, set-up cues, supervision of patient - no hands on  Mobility Referral Yes  Mobility visit 1 Mobility  Mobility Specialist Start Time (ACUTE ONLY) 0931  Mobility Specialist Stop Time (ACUTE ONLY) 0944  Mobility Specialist Time Calculation (min) (ACUTE ONLY) 13 min    Pt was initially agreeable to mobility session but once EOB stated he wasn't feeling it today. Refused all OOB mobility even with encouragement. Left in bed with all needs met, call bell in reach.   Ileana Lute Mobility Specialist Please contact via SecureChat or Rehab office at [phone]

## 2023-02-10 NOTE — Progress Notes (Signed)
 Summary: Russell [name] is a 58 y.o. male with remote history of cocaine use disorder and housing instability presenting with deteriorating mental status and admitted for septic encephalopathy.   Hospital day: 7  Subjective:  Patient seen and evaluated while resting in bed. He reports he is a little bit better. Reports he is breathing alright, denies cough and denies BM since yesterday.   Objective:  Vital signs in last 24 hours: Vitals:   02/09/23 0803 02/09/23 2203 02/09/23 2210 02/10/23 0503  BP: 132/77 (!) 173/102 (!) 155/100 (!) 163/96  Pulse: 83 84  78  Resp: 18 17  16   Temp: 98.7 F (37.1 C) 97.6 F (36.4 C)  98.1 F (36.7 C)  TempSrc:  Oral    SpO2:  100%  100%  Weight:      Height:       Physical Exam General: NAD, pleasant, cooperative Cardiovascular: RRR, no murmurs, rubs or gallops Respiratory: normal work of breathing, clear to auscultation bilaterally  Abdominal: soft, normal bowel sounds, no tenderness to palpation Skin: warm, dry Neurological: alert   Weight change:  No intake or output data in the 24 hours ending 02/10/23 0705     Latest Ref Rng & Units 02/10/2023    5:05 AM 02/09/2023    5:05 AM 02/08/2023   11:19 AM  CBC  WBC 4.0 - 10.5 K/uL 3.3  4.1  5.6   Hemoglobin 13.0 - 17.0 g/dL 87.0  86.9  85.6   Hematocrit 39.0 - 52.0 % 37.5  38.6  42.1   Platelets 150 - 400 K/uL 62  56  57       Latest Ref Rng & Units 02/10/2023    5:05 AM 02/09/2023    5:05 AM 02/08/2023   11:19 AM  CMP  Glucose 70 - 99 mg/dL 79  82  96   BUN 6 - 20 mg/dL 10  12  11    Creatinine 0.61 - 1.24 mg/dL 8.86  8.85  9.05   Sodium 135 - 145 mmol/L 130  131  131   Potassium 3.5 - 5.1 mmol/L 4.0  4.3  4.4   Chloride 98 - 111 mmol/L 104  108  108   CO2 22 - 32 mmol/L 20  18  17    Calcium  8.9 - 10.3 mg/dL 8.1  7.7  7.8    BNP: 616.3 ANA: Negative  CT Angio IMPRESSION: 1. No pulmonary embolus. 2. Dilated main pulmonary artery can be seen with pulmonary arterial  hypertension. 3. Improving left lower lobe aeration with decreasing ill-defined and masslike opacities. The previous masslike opacity is significantly improved with only faint residual nodular airspace disease. Findings are consistent with resolving infection/inflammation. 4. Small pericardial effusion. 5. Wall thickening of the distal esophagus, can be seen with esophagitis.   Aortic Atherosclerosis (ICD10-I70.0).  RUQ U/S IMPRESSION: - Possible early cirrhosis.  No suspicious hepatic lesion.  - Cholelithiasis, without sonographic findings to suggest acute cholecystitis.  - 1.8 cm polypoid lesion in the gallbladder fundus. This was poorly evaluated on prior CT due to motion degradation. Consider follow-up MRI abdomen with/without contrast in 3-4 weeks.  DG Chest 1 View IMPRESSION: Stable, negative radiograph. No new finding when compared to chest CT yesterday, left lower lobe opacity by CT is occult.  Assessment/Plan:  Principal Problem:   Pneumococcal bacteremia Active Problems:   Sepsis (HCC)   Fall   Encephalopathy   Community acquired pneumonia   Thrombocytopenia (HCC)   Hematuria   Homelessness  Alcoholism (HCC)   AIDS (acquired immune deficiency syndrome) (HCC)   Positive RPR test   Cholelithiasis without cholecystitis   Aortic atherosclerosis (HCC)   Cerebral atrophy (HCC)   Horseshoe kidney   Malnutrition of moderate degree   HIV dementia (HCC)   HCV (hepatitis C virus)   Pulmonary hypertension (HCC)   Right ventricular systolic dysfunction   Biological false positive RPR test  Russell [name] is a 58 y.o. male with remote history of cocaine use disorder and housing instability presenting with deteriorating mental status and admitted for septic encephalopathy on hospital day 7.  Acute encephalopathy  Severe sepsis 2/2 pneumococcal bacteremia CAP Patient appears to be improving. ID recommended amoxicillin  for 7 additional days. Given pneumococcal bacteremia with  an identified source (pneumonia), our team feels he has been adequately treated for strep pneumo with his 7-day course (per UpToDate) and does not think he needs additional treatment at this time.  - ID following, appreciate recommendations   AIDS HIV Dementia  Cerebral Atrophy  MRI shows age advanced cerebral atrophy. Concerned patient's mentation may be at baseline. Psychiatry evaluated patient and determined he does not have capacity to make medical decisions.  - Continue Biktarvy , DS Bactrim  daily for PJP prophylaxis  - Continue Crestor  5 mg - Delirium precautions - TOC following, appreciate assistance  - OT recommends SNF placement    HTN Patient hypertensive overnight with systolic BP between 116-173 and diastolic BP between 91-102 with AM BP of 116/91. - Continue to monitor BP - Continue amlodipine  5 mg for chronic BP control    HCV  Patient positive for Hep C with viral load of 19,300,000. RUQ U/S revealed possible early cirrhosis, no suspicious hepatic lesion, cholelithiasis without sonographic findings to suggest acute cholecystitis and an 1.8 cm polypoid lesion in the gallbladder fundus that was poorly evaluated on prior CT due to motion degradation. - Consider follow-up MRI abdomen with/without contrast in 3-4 weeks per radiology - Recommend outpatient treatment    Thrombocytopenia Platelets 62 today from 56 yesterday. Improved. Continue to suspect thrombocytopenia 2/2 to Hep C and HIV.    Severely reduced right ventricular systolic function Pulmonary Artery HTN Patient appears to be euvolemic on exam. Cardiology consulted, appreciate recommendations. They do not think he is a candidate for [facility] catheterization or aggressive cardiac therapies.   Decreased Bowel Movements Lactulose  10 mg TID started yesterday. Patient denies bowel movement since yesterday. Will continue to monitor.    VTE Prophylaxis: SCDs Diet: Regular  IVF: N/A Anticipated Discharge Location: SNF    Dispo: Anticipated discharge in approximately 1-2 day(s).    LOS: 7 days   Russell [name], Medical Student 02/10/2023, 7:05 AM

## 2023-02-11 ENCOUNTER — Inpatient Hospital Stay (HOSPITAL_COMMUNITY): Payer: Commercial Managed Care - HMO

## 2023-02-11 DIAGNOSIS — I272 Pulmonary hypertension, unspecified: Secondary | ICD-10-CM | POA: Diagnosis not present

## 2023-02-11 DIAGNOSIS — B182 Chronic viral hepatitis C: Secondary | ICD-10-CM | POA: Diagnosis not present

## 2023-02-11 DIAGNOSIS — J154 Pneumonia due to other streptococci: Secondary | ICD-10-CM | POA: Diagnosis not present

## 2023-02-11 DIAGNOSIS — R7881 Bacteremia: Secondary | ICD-10-CM | POA: Diagnosis not present

## 2023-02-11 DIAGNOSIS — B20 Human immunodeficiency virus [HIV] disease: Secondary | ICD-10-CM | POA: Diagnosis not present

## 2023-02-11 DIAGNOSIS — B953 Streptococcus pneumoniae as the cause of diseases classified elsewhere: Secondary | ICD-10-CM | POA: Diagnosis not present

## 2023-02-11 DIAGNOSIS — F0390 Unspecified dementia without behavioral disturbance: Secondary | ICD-10-CM | POA: Diagnosis not present

## 2023-02-11 LAB — CBC
HCT: 38 % — ABNORMAL LOW (ref 39.0–52.0)
Hemoglobin: 13.2 g/dL (ref 13.0–17.0)
MCH: 34.5 pg — ABNORMAL HIGH (ref 26.0–34.0)
MCHC: 34.7 g/dL (ref 30.0–36.0)
MCV: 99.2 fL (ref 80.0–100.0)
Platelets: 69 10*3/uL — ABNORMAL LOW (ref 150–400)
RBC: 3.83 MIL/uL — ABNORMAL LOW (ref 4.22–5.81)
RDW: 13.8 % (ref 11.5–15.5)
WBC: 3.2 10*3/uL — ABNORMAL LOW (ref 4.0–10.5)
nRBC: 0 % (ref 0.0–0.2)

## 2023-02-11 LAB — BASIC METABOLIC PANEL
Anion gap: 4 — ABNORMAL LOW (ref 5–15)
BUN: 12 mg/dL (ref 6–20)
CO2: 22 mmol/L (ref 22–32)
Calcium: 8.1 mg/dL — ABNORMAL LOW (ref 8.9–10.3)
Chloride: 106 mmol/L (ref 98–111)
Creatinine, Ser: 1.1 mg/dL (ref 0.61–1.24)
GFR, Estimated: >60 mL/min (ref >60)
Glucose, Bld: 95 mg/dL (ref 70–99)
Potassium: 3.8 mmol/L (ref 3.5–5.1)
Sodium: 132 mmol/L — ABNORMAL LOW (ref 135–145)

## 2023-02-11 LAB — RESP PANEL BY RT-PCR (RSV, FLU A&B, COVID)  RVPGX2
Influenza A by PCR: NEGATIVE
Influenza B by PCR: NEGATIVE
Resp Syncytial Virus by PCR: NEGATIVE
SARS Coronavirus 2 by RT PCR: NEGATIVE

## 2023-02-11 MED ORDER — AMOXICILLIN-POT CLAVULANATE 875-125 MG PO TABS: 1.0000 | ORAL_TABLET | Freq: Two times a day (BID) | ORAL | Status: DC

## 2023-02-11 MED ORDER — SENNA 8.6 MG PO TABS
1.0000 | ORAL_TABLET | Freq: Every day | ORAL | Status: DC
  Administered 2023-02-13 – 2023-04-03 (×47): 8.6 mg via ORAL
  Filled 2023-02-11 (×48): qty 1

## 2023-02-11 MED ORDER — OLANZAPINE 10 MG IM SOLR
5.0000 mg | Freq: Four times a day (QID) | INTRAMUSCULAR | Status: DC | PRN
  Administered 2023-02-11 (×2): 5 mg via INTRAVENOUS
  Filled 2023-02-11 (×4): qty 10

## 2023-02-11 MED ORDER — AMOXICILLIN 500 MG PO CAPS
1000.0000 mg | ORAL_CAPSULE | Freq: Three times a day (TID) | ORAL | Status: DC
  Administered 2023-02-11 – 2023-02-12 (×2): 1000 mg via ORAL
  Filled 2023-02-11 (×3): qty 2

## 2023-02-11 NOTE — Progress Notes (Signed)
 OT Cancellation Note  Patient Details Name: Russell [name] MRN: [id number] DOB: 01-04-1966   Cancelled Treatment:    Reason Eval/Treat Not Completed: Patient declined, no reason specified Patient refusing, would not look at OT, and when OT attempting to build rapport patient stating, "Shit why you gotta be so nosy." Patient declining OT returning later in the day. This is the patient's second refusal. If he refuses a third time, he will be removed from our caseload.   Mollie Anger E. Karen Kinnard, OTR/L Acute Rehabilitation Services [phone]   Vincent Greek 02/11/2023, 8:59 AM

## 2023-02-11 NOTE — Progress Notes (Signed)
 [facility]   Reason for visit: Follow up on HIV and pneumococcal bacteremia  Interval History: He has no complaints today, his white blood cell count is 3.2, no fever. Amoxicillin  stopped by the primary team. Respiratory viral panel being checked by the primary team  Physical Exam: Constitutional:  Vitals:   02/10/23 2334 02/11/23 0736  BP: (!) 116/91 119/73  Pulse: 76 95  Resp: 18 18  Temp: 98.4 F (36.9 C) 98 F (36.7 C)  SpO2: 100% 100%   patient appears in NAD Respiratory: Normal respiratory effort  Review of Systems: Constitutional: negative for fevers and chills  Lab Results  Component Value Date   WBC 3.2 (L) 02/11/2023   HGB 13.2 02/11/2023   HCT 38.0 (L) 02/11/2023   MCV 99.2 02/11/2023   PLT 69 (L) 02/11/2023    Lab Results  Component Value Date   CREATININE 1.10 02/11/2023   BUN 12 02/11/2023   NA 132 (L) 02/11/2023   K 3.8 02/11/2023   CL 106 02/11/2023   CO2 22 02/11/2023    Lab Results  Component Value Date   ALT 43 02/07/2023   AST 149 (H) 02/07/2023   ALKPHOS 77 02/07/2023     Microbiology: Recent Results (from the past 240 hours)  Blood culture (routine x 2)     Status: None   Collection Time: 02/03/23 10:40 AM   Specimen: BLOOD LEFT FOREARM  Result Value Ref Range Status   Specimen Description BLOOD LEFT FOREARM  Final   Special Requests   Final    BOTTLES DRAWN AEROBIC AND ANAEROBIC Blood Culture adequate volume   Culture   Final    NO GROWTH 5 DAYS Performed at [facility], [address] [place], [place] [address]    Report Status 02/08/2023 FINAL  Final  Blood culture (routine x 2)     Status: Abnormal   Collection Time: 02/03/23 10:45 AM   Specimen: BLOOD  Result Value Ref Range Status   Specimen Description BLOOD RIGHT ANTECUBITAL  Final   Special Requests   Final    BOTTLES DRAWN AEROBIC AND ANAEROBIC Blood Culture results may not be optimal due to an inadequate volume of blood  received in culture bottles   Culture  Setup Time   Final    GRAM POSITIVE COCCI IN PAIRS AEROBIC BOTTLE ONLY CRITICAL RESULT CALLED TO, READ BACK BY AND VERIFIED WITH: V BRYK,PHARMD@0420  02/04/23 MK Performed at [facility], [address], [place], [place] [address]    Culture STREPTOCOCCUS PNEUMONIAE (A)  Final   Report Status 02/06/2023 FINAL  Final   Organism ID, Bacteria STREPTOCOCCUS PNEUMONIAE  Final      Susceptibility   Streptococcus pneumoniae - MIC*    ERYTHROMYCIN  <=0.12 SENSITIVE Sensitive     LEVOFLOXACIN 1 SENSITIVE Sensitive     VANCOMYCIN  0.5 SENSITIVE Sensitive     PENICILLIN  (meningitis) <=0.06 SENSITIVE Sensitive     PENO - penicillin  <=0.06      PENICILLIN  (non-meningitis) <=0.06 SENSITIVE Sensitive     PENICILLIN  (oral) <=0.06 SENSITIVE Sensitive     CEFTRIAXONE  (non-meningitis) <=0.12 SENSITIVE Sensitive     CEFTRIAXONE  (meningitis) <=0.12 SENSITIVE Sensitive     * STREPTOCOCCUS PNEUMONIAE  Blood Culture ID Panel (Reflexed)     Status: Abnormal   Collection Time: 02/03/23 10:45 AM  Result Value Ref Range Status   Enterococcus faecalis NOT DETECTED NOT DETECTED Final   Enterococcus Faecium NOT DETECTED NOT DETECTED Final  Listeria monocytogenes NOT DETECTED NOT DETECTED Final   Staphylococcus species NOT DETECTED NOT DETECTED Final   Staphylococcus aureus (BCID) NOT DETECTED NOT DETECTED Final   Staphylococcus epidermidis NOT DETECTED NOT DETECTED Final   Staphylococcus lugdunensis NOT DETECTED NOT DETECTED Final   Streptococcus species DETECTED (A) NOT DETECTED Final    Comment: CRITICAL RESULT CALLED TO, READ BACK BY AND VERIFIED WITH: V BRYK,PHARMD@0420  02/04/23 MK    Streptococcus agalactiae NOT DETECTED NOT DETECTED Final   Streptococcus pneumoniae DETECTED (A) NOT DETECTED Final    Comment: CRITICAL RESULT CALLED TO, READ BACK BY AND VERIFIED WITH: V BRYK,PHARMD@0420  02/04/23 MK    Streptococcus pyogenes NOT DETECTED NOT DETECTED Final    A.calcoaceticus-baumannii NOT DETECTED NOT DETECTED Final   Bacteroides fragilis NOT DETECTED NOT DETECTED Final   Enterobacterales NOT DETECTED NOT DETECTED Final   Enterobacter cloacae complex NOT DETECTED NOT DETECTED Final   Escherichia coli NOT DETECTED NOT DETECTED Final   Klebsiella aerogenes NOT DETECTED NOT DETECTED Final   Klebsiella oxytoca NOT DETECTED NOT DETECTED Final   Klebsiella pneumoniae NOT DETECTED NOT DETECTED Final   Proteus species NOT DETECTED NOT DETECTED Final   Salmonella species NOT DETECTED NOT DETECTED Final   Serratia marcescens NOT DETECTED NOT DETECTED Final   Haemophilus influenzae NOT DETECTED NOT DETECTED Final   Neisseria meningitidis NOT DETECTED NOT DETECTED Final   Pseudomonas aeruginosa NOT DETECTED NOT DETECTED Final   Stenotrophomonas maltophilia NOT DETECTED NOT DETECTED Final   Candida albicans NOT DETECTED NOT DETECTED Final   Candida auris NOT DETECTED NOT DETECTED Final   Candida glabrata NOT DETECTED NOT DETECTED Final   Candida krusei NOT DETECTED NOT DETECTED Final   Candida parapsilosis NOT DETECTED NOT DETECTED Final   Candida tropicalis NOT DETECTED NOT DETECTED Final   Cryptococcus neoformans/gattii NOT DETECTED NOT DETECTED Final    Comment: Performed at [facility], [address], [place], [place] [address]  Culture, blood (Routine X 2) w Reflex to ID Panel     Status: None   Collection Time: 02/05/23  5:03 PM   Specimen: BLOOD LEFT ARM  Result Value Ref Range Status   Specimen Description BLOOD LEFT ARM  Final   Special Requests   Final    BOTTLES DRAWN AEROBIC ONLY Blood Culture results may not be optimal due to an inadequate volume of blood received in culture bottles   Culture   Final    NO GROWTH 5 DAYS Performed at [facility], [address] [place], [place] [address]    Report Status 02/10/2023 FINAL  Final  Culture, blood (Routine X 2) w Reflex to ID Panel     Status: None   Collection Time:  02/05/23  5:03 PM   Specimen: BLOOD  Result Value Ref Range Status   Specimen Description BLOOD SITE NOT SPECIFIED  Final   Special Requests   Final    BOTTLES DRAWN AEROBIC ONLY Blood Culture results may not be optimal due to an inadequate volume of blood received in culture bottles   Culture   Final    NO GROWTH 5 DAYS Performed at [facility], [address], [place], [place] [address]    Report Status 02/10/2023 FINAL  Final    Impression/Plan:  1.  Pneumococcal bacteremia.  He completed 7 days of IV antibiotics and was transitioned to amoxicillin .  Now stopped per the primary team.  No fever no chills.  2.  HIV.  New diagnosis  and doing well on Biktarvy .  He will remain on this and last CD4 count was 116.  Continue with Biktarvy .  3.  At risk for opportunistic infections.  He is on Bactrim  for pneumocystis prophylaxis and will continue.  4.  Dementia.  Most consistent with HIV related dementia.  On treatment as above.  Seen by psychiatry and does not have capacity to make decisions.  5.  Thrombocytopenia.  Likely related to his untreated HIV.  Will continue to monitor.  6.  Chronic hepatitis C.  Can consider treatment as an outpatient.

## 2023-02-11 NOTE — TOC Progression Note (Signed)
 Transition of Care [facility]) - Progression Note    Patient Details  Name: Russell [name] MRN: [id number] Date of Birth: 1965/06/12  Transition of Care [facility]) CM/SW Contact  Cylus Douville A [place], [place] Phone Number: 02/11/2023, 5:31 PM  Clinical Narrative:     CSW completed IVC paperwork. Case info on chart.    TOC will continue to follow.       Expected Discharge Plan and Services                                               Social Determinants of Health (SDOH) Interventions SDOH Screenings   Food Insecurity: No Food Insecurity (02/04/2023)  Housing: High Risk (02/04/2023)  Transportation Needs: No Transportation Needs (02/04/2023)  Utilities: Not At Risk (02/04/2023)  Tobacco Use: High Risk (02/03/2023)    Readmission Risk Interventions     No data to display

## 2023-02-11 NOTE — Progress Notes (Signed)
 Summary: Russell [name] is a 58 y.o. male with remote history of cocaine use disorder and housing instability presenting with deteriorating mental status and admitted for septic encephalopathy.   Hospital day: 8  Subjective:  Nursing contacted team that patient wanted to leave AMA this morning. Team reported to unit to assess patient. Patient reports he wants to leave and wants to call his sister. Team called sister, Russell [name] to update her on his status and recommended she call his hospital room. Team was able to get patient back to his room and patient appeared to agree to stay. Unable to thoroughly assess patient as patient denied physical exam this morning. Sitter order was placed.  Around lunchtime, team was called given patient was aggressive towards staff and patient wanting to leave. Zyprexa  was given.  Later in the afternoon, team assessed patient while he was sitting in chair in his room. He was able to participate in exam more than this morning. He reports he feels tired, denies hearing voices and denies seeing other people in the room. He states he is in the hospital for his throat; however, he denies throat pain with swallowing. He denies coughing or difficulty breathing. Endorses chills. Reports he talked with his sister today.   Objective:  Vital signs in last 24 hours: Vitals:   02/10/23 0503 02/10/23 0800 02/10/23 1621 02/10/23 2334  BP: (!) 163/96 (!) 116/91 124/79 (!) 116/91  Pulse: 78 79 84 76  Resp: 16 18 18 18   Temp: 98.1 F (36.7 C) 98 F (36.7 C) 98 F (36.7 C) 98.4 F (36.9 C)  TempSrc:    Oral  SpO2: 100% 100% 100% 100%  Weight:      Height:       Physical Exam General: Tired appearing; sitting in chair in room in hospital gown with two jackets on and his hat; more cooperative with exam this afternoon than this morning Nose: nasal drainage present Cardiovascular: RRR, no murmurs, rubs or gallops Respiratory: decreased breath sounds but patient did not  appear to be taking deep breaths when asked; normal work of breathing; no crackles appreciated Extremities: able to move all four extremities Neurological: alert, confused, oriented to person and place this afternoon   Weight change:   Intake/Output Summary (Last 24 hours) at 02/11/2023 0626 Last data filed at 02/10/2023 1432 Gross per 24 hour  Intake 472 ml  Output --  Net 472 ml      Latest Ref Rng & Units 02/11/2023    6:34 AM 02/10/2023    5:05 AM 02/09/2023    5:05 AM  CBC  WBC 4.0 - 10.5 K/uL 3.2  3.3  4.1   Hemoglobin 13.0 - 17.0 g/dL 78.2  95.6  21.3   Hematocrit 39.0 - 52.0 % 38.0  37.5  38.6   Platelets 150 - 400 K/uL 69  62  56       Latest Ref Rng & Units 02/11/2023    6:34 AM 02/10/2023    5:05 AM 02/09/2023    5:05 AM  CMP  Glucose 70 - 99 mg/dL 95  79  82   BUN 6 - 20 mg/dL 12  10  12    Creatinine 0.61 - 1.24 mg/dL 0.86  5.78  4.69   Sodium 135 - 145 mmol/L 132  130  131   Potassium 3.5 - 5.1 mmol/L 3.8  4.0  4.3   Chloride 98 - 111 mmol/L 106  104  108   CO2 22 -  32 mmol/L 22  20  18    Calcium  8.9 - 10.3 mg/dL 8.1  8.1  7.7    NM Pulmonary Perfusion IMPRESSION: No scintigraphic findings of pulmonary embolus. Normal perfusion scan.  Assessment/Plan:  Principal Problem:   Pneumococcal bacteremia Active Problems:   Sepsis (HCC)   Fall   Encephalopathy   Community acquired pneumonia   Thrombocytopenia (HCC)   Hematuria   Homelessness   Alcoholism (HCC)   AIDS (HCC)   Positive RPR test   Cholelithiasis without cholecystitis   Aortic atherosclerosis (HCC)   Cerebral atrophy (HCC)   Horseshoe kidney   Malnutrition of moderate degree   HIV dementia (HCC)   HCV (hepatitis C virus)   Pulmonary hypertension (HCC)   Right ventricular systolic dysfunction   Biological false positive RPR test   Onychomycosis  Russell [name] is a 58 y.o. male with remote history of cocaine use disorder and housing instability presenting with deteriorating mental status  and admitted for septic encephalopathy on hospital day 8.   Acute encephalopathy  Severe sepsis 2/2 pneumococcal bacteremia CAP AIDS HIV Dementia  Cerebral Atrophy  Delirium This morning, patient not as lucid today on exam as yesterday. He stated he wanted to leave and talk with his sister. He appeared agitated and nursing reports he was aggressive towards the sitter around lunchtime, so the team evaluated patient and Zyprexa  was given.   This afternoon, patient more oriented, but still confused. Suspect this is 2/2 to delirium. Do not think it is from his encephalopathy as patient was lucid yesterday on exam. Prior MRI revealed age advanced cerebral atrophy, so concurrent HIV dementia possibly exacerbating his symptoms. Given patient wanting to leave the hospital and concern for his safety and lack of capacity, IVC paperwork completed. Psychiatry previously evaluated patient on 1/10 and determined he does not have capacity to make medical decisions. Plan to consult psychiatry again to assist in assessing agitation. We are also concerned given patient has new nasal drainage and chills if he is exhibiting a new infection. Plan to order a respiratory panel and start empiric amoxicillin . Will also order a chest x-ray and blood cultures.  - Start amoxicillin  1000 q8 hours - Chest x-ray - Blood culture - Plan to consult psychiatry, appreciate recommendations - Continue Biktarvy , DS Bactrim  daily for PJP prophylaxis  - Continue Crestor  5 mg - Delirium precautions - TOC following, appreciate assistance  - Will consult PT and OT to reassess patient for disposition after delirium resolves - ID following, appreciate recommendations  HTN BP systolic between 213-086 and diastolic between 57-84 overnight. Stable. - Continue to monitor BP - Continue amlodipine  5 mg for chronic BP control    HCV  Patient positive for Hep C with viral load of 19,300,000. RUQ U/S revealed possible early cirrhosis, no  suspicious hepatic lesion, cholelithiasis without sonographic findings to suggest acute cholecystitis and an 1.8 cm polypoid lesion in the gallbladder fundus that was poorly evaluated on prior CT due to motion degradation. - Consider follow-up MRI abdomen with/without contrast in 3-4 weeks per radiology - Recommend outpatient treatment    Thrombocytopenia Platelets 69 today from 62 yesterday. Improved. Continue to suspect thrombocytopenia 2/2 to Hep C and HIV.    Severely reduced right ventricular systolic function Pulmonary Artery HTN Cardiology consulted, appreciate recommendations. They do not think he is a candidate for [facility] catheterization or aggressive cardiac therapies.   Decreased Bowel Movements Patient denies BM today. Will start senna. Will continue to monitor.  VTE Prophylaxis: SCDs Diet: Regular  IVF: N/A Anticipated Discharge Location: SNF or home    Dispo: Anticipated discharge in approximately 4-5 days.   LOS: 8 days   Janise Melia, Medical Student 02/11/2023, 6:26 AM

## 2023-02-11 NOTE — Plan of Care (Signed)

## 2023-02-11 NOTE — Progress Notes (Signed)
 Mobility Specialist: Progress Note   02/11/23 1233  Mobility  Activity Ambulated independently in hallway  Level of Assistance Independent  Assistive Device None  Distance Ambulated (ft) 550 ft  Activity Response Tolerated well  Mobility Referral Yes  Mobility visit 1 Mobility  Mobility Specialist Start Time (ACUTE ONLY) [id number]  Mobility Specialist Stop Time (ACUTE ONLY) 0942  Mobility Specialist Time Calculation (min) (ACUTE ONLY) 15 min    Pt received ambulating in hallways with NT. Very confused and agitated today - trying to "get out" and stated he was meeting up with somebody. Ambulated ind throughout. Redirected to room and left sitting in chair near TV. RN and NT aware.   Deloria Fetch Mobility Specialist Please contact via SecureChat or Rehab office at [phone]

## 2023-02-11 NOTE — Progress Notes (Signed)
 Rounding Note    Patient Name: Russell [name] Date of Encounter: 02/11/2023  [facility] Cardiologist: Alexandria Angel, MD   Subjective   Remains confused.  Denies dyspnea or chest pain.  Inpatient Medications    Scheduled Meds:  amLODipine   5 mg Oral Daily   bictegravir-emtricitabine -tenofovir  AF  1 tablet Oral Daily   feeding supplement  237 mL Oral BID BM   folic acid   1 mg Oral Daily   multivitamin with minerals  1 tablet Oral Daily   rosuvastatin   5 mg Oral Daily   sodium chloride  flush  3 mL Intravenous Q12H   sulfamethoxazole -trimethoprim   1 tablet Oral Daily   thiamine   100 mg Oral Daily   Or   thiamine   100 mg Intravenous Daily   Continuous Infusions:  PRN Meds: sodium chloride  flush   Vital Signs    Vitals:   02/10/23 0800 02/10/23 1621 02/10/23 2334 02/11/23 0736  BP: (!) 116/91 124/79 (!) 116/91 119/73  Pulse: 79 84 76 95  Resp: 18 18 18 18   Temp: 98 F (36.7 C) 98 F (36.7 C) 98.4 F (36.9 C) 98 F (36.7 C)  TempSrc:   Oral   SpO2: 100% 100% 100% 100%  Weight:      Height:        Intake/Output Summary (Last 24 hours) at 02/11/2023 0900 Last data filed at 02/10/2023 1432 Gross per 24 hour  Intake 472 ml  Output --  Net 472 ml      02/03/2023    6:00 PM 06/03/2018   11:23 AM  Last 3 Weights  Weight (lbs) 127 lb 6.8 oz 145 lb 8.1 oz  Weight (kg) 57.8 kg 66 kg      Telemetry    Sinus - Personally Reviewed   Physical Exam   GEN: NAD.  Chronically ill appearing Neck: supple Cardiac: RRR Respiratory: CTA GI: Soft, NT/ND MS: No edema Neuro:  Grossly intact Psych: Normal affect; confused  Labs    \ Chemistry Recent Labs  Lab 02/05/23 1226 02/07/23 1144 02/08/23 1119 02/09/23 0505 02/10/23 0505 02/11/23 0634  NA 133* 131*   < > 131* 130* 132*  K 3.8 4.1   < > 4.3 4.0 3.8  CL 109 108   < > 108 104 106  CO2 15* 16*   < > 18* 20* 22  GLUCOSE 85 116*   < > 82 79 95  BUN 16 9   < > 12 10 12   CREATININE 1.06 0.96    < > 1.14 1.13 1.10  CALCIUM  7.8* 8.0*   < > 7.7* 8.1* 8.1*  MG 2.0  --   --   --   --   --   PROT  --  9.3*  --   --   --   --   ALBUMIN   --  2.0*  --   --   --   --   AST  --  149*  --   --   --   --   ALT  --  43  --   --   --   --   ALKPHOS  --  77  --   --   --   --   BILITOT  --  1.6*  --   --   --   --   GFRNONAA >60 >60   < > >60 >60 >60  ANIONGAP 9 7   < > 5 6 4*   < > =  values in this interval not displayed.    Lipids  Recent Labs  Lab 02/05/23 1226  CHOL 120  TRIG 136  HDL 14*  LDLCALC 79  CHOLHDL 8.6    Hematology Recent Labs  Lab 02/09/23 0505 02/10/23 0505 02/11/23 0634  WBC 4.1 3.3* 3.2*  RBC 3.88* 3.78* 3.83*  HGB 13.0 12.9* 13.2  HCT 38.6* 37.5* 38.0*  MCV 99.5 99.2 99.2  MCH 33.5 34.1* 34.5*  MCHC 33.7 34.4 34.7  RDW 13.8 14.0 13.8  PLT 56* 62* 69*     BNP Recent Labs  Lab 02/09/23 1417  BNP 383.3*     Radiology    NM Pulmonary Perfusion Result Date: 02/10/2023 CLINICAL DATA:  Pulmonary embolism (PE) suspected, high prob EXAM: NUCLEAR MEDICINE PERFUSION LUNG SCAN TECHNIQUE: Perfusion images were obtained in multiple projections after intravenous injection of radiopharmaceutical. Ventilation scans intentionally deferred if perfusion scan and chest x-ray adequate for interpretation during COVID 19 epidemic. RADIOPHARMACEUTICALS:  4.4 mCi Tc-24m MAA IV COMPARISON:  Chest radiograph performed same day. Chest CTA yesterday FINDINGS: Homogeneous distribution of radiotracer. No perfusion defects. Arms down positioning creates artifact on the lateral views. IMPRESSION: No scintigraphic findings of pulmonary embolus. Normal perfusion scan. Electronically Signed   By: Chadwick Colonel M.D.   On: 02/10/2023 17:58   DG Chest 1 View Result Date: 02/10/2023 CLINICAL DATA:  Pneumonia EXAM: CHEST  1 VIEW COMPARISON:  Chest CT from yesterday mild FINDINGS: Normal heart size and mediastinal contours. Reticulation at the bases is radiographically stable, left lower  lobe opacity by CT is not clearly seen. No acute infiltrate or edema. No effusion or pneumothorax. No acute osseous findings. IMPRESSION: Stable, negative radiograph. No new finding when compared to chest CT yesterday, left lower lobe opacity by CT is occult. Electronically Signed   By: Ronnette Coke M.D.   On: 02/10/2023 09:43   US  Abdomen Limited RUQ (LIVER/GB) Result Date: 02/09/2023 CLINICAL DATA:  Hepatitis, evaluate for cirrhosis EXAM: ULTRASOUND ABDOMEN LIMITED RIGHT UPPER QUADRANT COMPARISON:  CT abdomen/pelvis dated 02/03/2023 FINDINGS: Gallbladder: Layering small gallstones. 1.8 cm polypoid lesion in the gallbladder fundus with mild vascularity. No gallbladder wall thickening or pericholecystic fluid. Negative sonographic Murphy's sign. Common bile duct: Diameter: 3 mm Liver: Micronodular hepatic contour (image 20), raising the possibility of early cirrhosis. 1.2 x 1.6 x 1.4 cm hyperechoic lesion along the falciform ligament, favoring focal fat over a benign hemangioma. Portal vein is patent on color Doppler imaging with normal direction of blood flow towards the liver. Other: None. IMPRESSION: Possible early cirrhosis.  No suspicious hepatic lesion. Cholelithiasis, without sonographic findings to suggest acute cholecystitis. 1.8 cm polypoid lesion in the gallbladder fundus. This was poorly evaluated on prior CT due to motion degradation. Consider follow-up MRI abdomen with/without contrast in 3-4 weeks. Electronically Signed   By: Zadie Herter M.D.   On: 02/09/2023 23:09   CT Angio Chest Pulmonary Embolism (PE) W or WO Contrast Result Date: 02/09/2023 CLINICAL DATA:  Pulmonary embolism (PE) suspected, high prob EXAM: CT ANGIOGRAPHY CHEST WITH CONTRAST TECHNIQUE: Multidetector CT imaging of the chest was performed using the standard protocol during bolus administration of intravenous contrast. Multiplanar CT image reconstructions and MIPs were obtained to evaluate the vascular anatomy.  RADIATION DOSE REDUCTION: This exam was performed according to the departmental dose-optimization program which includes automated exposure control, adjustment of the mA and/or kV according to patient size and/or use of iterative reconstruction technique. CONTRAST:  75mL OMNIPAQUE  IOHEXOL  350 MG/ML SOLN COMPARISON:  Chest  CT 02/03/2023 FINDINGS: Cardiovascular: There are no filling defects within the pulmonary arteries to suggest pulmonary embolus. The heart is normal in size. There are coronary artery calcifications. Small pericardial effusion measures up to 10 mm in depth anteriorly. 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 aortic atherosclerosis without aneurysm or acute aortic findings. Main pulmonary artery is dilated at 3.4 cm. Mediastinum/Nodes: Wall thickening of the distal esophagus. No mediastinal or hilar adenopathy. Lungs/Pleura: Improving left lower lobe aeration with decreasing ill-defined and masslike opacities. The previous masslike opacity is significantly improved with only faint residual nodular airspace disease, series 7, image 72. No new airspace disease. Trachea and central airways are clear. No pleural effusion. Upper Abdomen: No acute findings. Musculoskeletal: There are no acute or suspicious osseous abnormalities. Review of the MIP images confirms the above findings. IMPRESSION: 1. No pulmonary embolus. 2. Dilated main pulmonary artery can be seen with pulmonary arterial hypertension. 3. Improving left lower lobe aeration with decreasing ill-defined and masslike opacities. The previous masslike opacity is significantly improved with only faint residual nodular airspace disease. Findings are consistent with resolving infection/inflammation. 4. Small pericardial effusion. 5. Wall thickening of the distal esophagus, can be seen with esophagitis. Aortic Atherosclerosis (ICD10-I70.0).  Electronically Signed   By: Chadwick Colonel M.D.   On: 02/09/2023 16:50     Patient Profile     58 year old male with past medical history of substance abuse including cocaine and alcohol, admitted with encephalopathy, pneumonia/pneumococcal sepsis, newly diagnosed HIV, hepatitis B positive for evaluation of right heart failure/pulmonary hypertension. He is felt to have HIV encephalopathy. Echocardiogram January 2025 personally reviewed and showed normal LV function, moderate left ventricular hypertrophy, grade 2 diastolic dysfunction, D-shaped septum suggestive of RV pressure/volume overload, severe RV dysfunction, severe right ventricular enlargement, moderate pulmonary hypertension, mild right atrial enlargement, mild to moderate tricuspid regurgitation, trace aortic insufficiency and small pericardial effusion.  CTA showed no pulmonary embolus; dilated pulmonary artery consistent with pulmonary hypertension.  Assessment & Plan    1 pulmonary hypertension/RV dysfunction-etiology of pulmonary hypertension likely newly diagnosed HIV.  ANA negative. CTA shows no pulmonary embolus.  VQ scan normal.  As outlined previously I [name] not think he can cooperate with pulmonary function testing.  Also given his comorbidities including HIV dementia he is not a candidate for right heart catheterization or aggressive cardiac therapies.  Continue therapy for HIV.  Note he is not volume overloaded on examination.     2 HIV/HIV dementia/pneumococcal pneumonia/bacteremia/RPR positive/hepatitis C-Per infectious disease.   3 history of substance abuse  Cardiology will sign off.  No new recommendations.  He does not require cardiology follow-up but should follow-up with infectious disease and primary care.  For questions or updates, please contact [facility] Please consult [email or web address] for contact info under        Signed, Alexandria Angel, MD  02/11/2023, 9:00 AM

## 2023-02-12 DIAGNOSIS — G9340 Encephalopathy, unspecified: Secondary | ICD-10-CM | POA: Diagnosis not present

## 2023-02-12 DIAGNOSIS — R7881 Bacteremia: Secondary | ICD-10-CM | POA: Diagnosis not present

## 2023-02-12 DIAGNOSIS — B953 Streptococcus pneumoniae as the cause of diseases classified elsewhere: Secondary | ICD-10-CM | POA: Diagnosis not present

## 2023-02-12 LAB — CBC WITH DIFFERENTIAL/PLATELET
Abs Immature Granulocytes: 0.02 10*3/uL (ref 0.00–0.07)
Basophils Absolute: 0 10*3/uL (ref 0.0–0.1)
Basophils Relative: 1 %
Eosinophils Absolute: 0.1 10*3/uL (ref 0.0–0.5)
Eosinophils Relative: 2 %
HCT: 35.6 % — ABNORMAL LOW (ref 39.0–52.0)
Hemoglobin: 12.3 g/dL — ABNORMAL LOW (ref 13.0–17.0)
Immature Granulocytes: 1 %
Lymphocytes Relative: 22 %
Lymphs Abs: 0.8 10*3/uL (ref 0.7–4.0)
MCH: 34 pg (ref 26.0–34.0)
MCHC: 34.6 g/dL (ref 30.0–36.0)
MCV: 98.3 fL (ref 80.0–100.0)
Monocytes Absolute: 0.2 10*3/uL (ref 0.1–1.0)
Monocytes Relative: 6 %
Neutro Abs: 2.4 10*3/uL (ref 1.7–7.7)
Neutrophils Relative %: 68 %
Platelets: 73 10*3/uL — ABNORMAL LOW (ref 150–400)
RBC: 3.62 MIL/uL — ABNORMAL LOW (ref 4.22–5.81)
RDW: 13.9 % (ref 11.5–15.5)
WBC: 3.4 10*3/uL — ABNORMAL LOW (ref 4.0–10.5)
nRBC: 0 % (ref 0.0–0.2)

## 2023-02-12 LAB — COMPREHENSIVE METABOLIC PANEL
ALT: 31 U/L (ref 0–44)
AST: 77 U/L — ABNORMAL HIGH (ref 15–41)
Albumin: 1.9 g/dL — ABNORMAL LOW (ref 3.5–5.0)
Alkaline Phosphatase: 74 U/L (ref 38–126)
Anion gap: 7 (ref 5–15)
BUN: 12 mg/dL (ref 6–20)
CO2: 17 mmol/L — ABNORMAL LOW (ref 22–32)
Calcium: 7.9 mg/dL — ABNORMAL LOW (ref 8.9–10.3)
Chloride: 105 mmol/L (ref 98–111)
Creatinine, Ser: 1.54 mg/dL — ABNORMAL HIGH (ref 0.61–1.24)
GFR, Estimated: 52 mL/min — ABNORMAL LOW (ref >60)
Glucose, Bld: 94 mg/dL (ref 70–99)
Potassium: 4 mmol/L (ref 3.5–5.1)
Sodium: 129 mmol/L — ABNORMAL LOW (ref 135–145)
Total Bilirubin: 1.2 mg/dL (ref 0.0–1.2)
Total Protein: 9 g/dL — ABNORMAL HIGH (ref 6.5–8.1)

## 2023-02-12 LAB — PROTIME-INR
INR: 1.2 (ref 0.8–1.2)
Prothrombin Time: 15.8 s — ABNORMAL HIGH (ref 11.4–15.2)

## 2023-02-12 MED ORDER — QUETIAPINE FUMARATE 25 MG PO TABS
12.5000 mg | ORAL_TABLET | Freq: Every day | ORAL | Status: DC | PRN
  Administered 2023-02-12: 12.5 mg via ORAL
  Filled 2023-02-12: qty 1

## 2023-02-12 MED ORDER — OLANZAPINE 5 MG PO TBDP
5.0000 mg | ORAL_TABLET | Freq: Once | ORAL | Status: AC
  Administered 2023-02-13: 5 mg via ORAL
  Filled 2023-02-12: qty 1

## 2023-02-12 MED ORDER — OLANZAPINE 5 MG PO TBDP: 10.0000 mg | ORAL_TABLET | Freq: Every day | ORAL | Status: DC

## 2023-02-12 MED ORDER — OLANZAPINE 5 MG PO TBDP
5.0000 mg | ORAL_TABLET | Freq: Two times a day (BID) | ORAL | Status: DC
  Administered 2023-02-13 – 2023-03-03 (×38): 5 mg via ORAL
  Filled 2023-02-12 (×39): qty 1

## 2023-02-12 MED ORDER — OLANZAPINE 10 MG IM SOLR
5.0000 mg | Freq: Two times a day (BID) | INTRAMUSCULAR | Status: DC | PRN
  Administered 2023-02-12 (×2): 5 mg via INTRAMUSCULAR
  Filled 2023-02-12 (×2): qty 10

## 2023-02-12 MED ORDER — QUETIAPINE FUMARATE 25 MG PO TABS
25.0000 mg | ORAL_TABLET | Freq: Three times a day (TID) | ORAL | Status: DC
  Administered 2023-02-12: 25 mg via ORAL
  Filled 2023-02-12: qty 1

## 2023-02-12 MED ORDER — STERILE WATER FOR INJECTION IJ SOLN
INTRAMUSCULAR | Status: AC
  Filled 2023-02-12: qty 10

## 2023-02-12 MED ORDER — OLANZAPINE 5 MG PO TBDP
10.0000 mg | ORAL_TABLET | Freq: Every day | ORAL | Status: DC
Start: 2023-02-12 — End: 2023-04-04
  Administered 2023-02-13 – 2023-04-03 (×48): 10 mg via ORAL
  Filled 2023-02-12 (×52): qty 2

## 2023-02-12 NOTE — Progress Notes (Signed)
Summary: Russell [name] is a 58 y.o. male with remote history of cocaine use disorder and housing instability presenting with deteriorating mental status and admitted for septic encephalopathy.   Hospital day: 9  Subjective:  Patient seen and evaluated while sitting on the side of his hospital bed. Patient reports he is "alright." He denies CP, SOB, N/V, BM or throat pain. He endorses some nasal drainage.   Objective:  Vital signs in last 24 hours: Vitals:   02/10/23 0800 02/10/23 1621 02/10/23 2334 02/11/23 0736  BP: (!) 116/91 124/79 (!) 116/91 119/73  Pulse: 79 84 76 95  Resp: 18 18 18 18   Temp: 98 F (36.7 C) 98 F (36.7 C) 98.4 F (36.9 C) 98 F (36.7 C)  TempSrc:   Oral   SpO2: 100% 100% 100% 100%  Weight:      Height:       Physical Exam General: NAD, frustrated at times with questions during exam, cooperative Cardiovascular: RRR, no murmurs, rubs or gallops Respiratory: normal work of breathing, clear to auscultation bilaterally Extremities: able to move all four extremities Neurological: alert, some confusion, oriented to person and place, not to time  Weight change:   Intake/Output Summary (Last 24 hours) at 02/12/2023 0711 Last data filed at 02/11/2023 2052 Gross per 24 hour  Intake 60 ml  Output --  Net 60 ml   Respiratory Panel: Unremarkable  Chest X-ray IMPRESSION: No active disease.  Assessment/Plan:  Principal Problem:   Pneumococcal bacteremia Active Problems:   Sepsis (HCC)   Fall   Encephalopathy   Community acquired pneumonia   Thrombocytopenia (HCC)   Hematuria   Homelessness   Alcoholism (HCC)   AIDS (HCC)   Positive RPR test   Cholelithiasis without cholecystitis   Aortic atherosclerosis (HCC)   Cerebral atrophy (HCC)   Horseshoe kidney   Malnutrition of moderate degree   HIV dementia (HCC)   HCV (hepatitis C virus)   Pulmonary hypertension (HCC)   Right ventricular systolic dysfunction   Biological false positive RPR  test   Onychomycosis  Russell [name] is a 58 y.o. male with remote history of cocaine use disorder and housing instability presenting with deteriorating mental status and admitted for septic encephalopathy on hospital day 9.   Acute encephalopathy  Severe sepsis 2/2 pneumococcal bacteremia CAP AIDS HIV Dementia  Cerebral Atrophy  Delirium Patient more lucid today. Still confused on exam. Did not have the correct year (thought it was 31) and thought his birthday had just passed as it is tomorrow. Team explained at bedside why he is in the hospital. He appeared attentive yet frustrated at times with our questions. Psychiatry consulted to assess his agitation. Psychiatry saw patient and recommends to discontinue Zyprexa and start Seroquel. Appreciate recommendations. Given patient's respiratory panel negative and chest x-ray clear, will stop amoxicillin. BC pending with no growth yet. MRI during admission revealed age advanced cerebral atrophy. Concerned patient's mentation may be at baseline with HIV dementia. Will need to discuss with family regarding discharge planning and consult PT/OT to assess disposition. Do not feel patient would be able to be discharged home without assistance from family given concerns for orientation. TOC following, appreciate recommendations.  - Psychiatry following, appreciate recommendations - Continue Biktarvy, DS Bactrim daily for PJP prophylaxis  - Continue Crestor 5 mg - Delirium precautions - TOC following, appreciate assistance  - Will consult PT and OT to reassess patient for disposition    HTN BP stable this AM (131/84). Stable. -  Continue to monitor BP - Continue amlodipine 5 mg for chronic BP control    HCV  Patient positive for Hep C with viral load of 19,300,000. RUQ U/S revealed possible early cirrhosis, no suspicious hepatic lesion, cholelithiasis without sonographic findings to suggest acute cholecystitis and an 1.8 cm polypoid lesion in the  gallbladder fundus that was poorly evaluated on prior CT due to motion degradation. - Consider follow-up MRI abdomen with/without contrast in 3-4 weeks per radiology - Recommend outpatient treatment    Thrombocytopenia Platelets 69 yesterday. Patient refused labs today. Continue to suspect thrombocytopenia 2/2 to Hep C and HIV.    Severely reduced right ventricular systolic function Pulmonary Artery HTN Cardiology consulted, appreciate recommendations. They do not think he is a candidate for [facility] catheterization or aggressive cardiac therapies. Cardiology does not recommend cardiology follow-up at this time.   Decreased Bowel Movements Patient denies BM today. Continue senna. Will continue to monitor.    VTE Prophylaxis: SCDs Diet: Regular  IVF: N/A Anticipated Discharge Location: SNF or home    Dispo: Anticipated discharge in approximately 1-2 days.   LOS: 9 days   [name], Medical Student 02/12/2023, 7:11 AM

## 2023-02-12 NOTE — Plan of Care (Signed)

## 2023-02-12 NOTE — Consult Note (Addendum)
[facility] Psychiatric Consult Initial  Patient Name: .Russell [name]  MRN: [id number]  DOB: [birth year]  Consult Order details:  Orders (From admission, onward)     Start     Ordered   02/11/23 1440  IP CONSULT TO PSYCHIATRY       Comments: Patient has acute encephalopathy, recent diagnosed HIV. Had recent strep pneumonia and bacteremia. We have IVC'ed him because he does not have capacity, agitated, would like assistance in treating his encephalopathy.  Ordering Provider: Manuela Neptune, MD  Provider:  (Not yet assigned)  Question Answer Comment  Location [facility]   Reason for Consult? agitation      02/11/23 1441   02/05/23 1517  IP CONSULT TO PSYCHIATRY       Comments: Admitted for sepsis due to S. Pneumoniae bacteremia, also HIV positive, positive RPR. Waxing and waning mental status, at times seems to be having visual hallucinations. Infection is improving but mental status is still poor. MRI shows advanced cerebral atrophy for age.  Ordering Provider: Marrianne Mood, MD  Provider:  (Not yet assigned)  Question Answer Comment  Location [facility]   Reason for Consult? Delirium versus underlying psychotic disorder      02/05/23 1519             Mode of Visit: In person    Psychiatry Consult Evaluation  Service Date: February 12, 2023 LOS:  LOS: 9 days  Chief Complaint   Primary Psychiatric Diagnoses  Acute agitation   Assessment  Russell [name] is a 58 y.o. male admitted for septic encephalopathy 02/03/2023  9:55 AM. He carries no formal psychiatric illness and has a past medical history of HIV dementia, cerebral atrophy, AIDS, Pulmonary HTN, HTN, severely reduced RV funx, thrombocytopenia and Hepatitis C.   On initial examination, patient is calmly laying in bed and cooperative during the interview. The Patient alert and oriented to location and person. Disoriented to month and year. Reports can be irritable and  anxious at times, but can't recall trying to leave AMA yesterday. Patient received agitation medications yesterday, was redirected to the room and refused medications overnight per chart review.   Per nursing staff and techs, patient has remained appropriate this morning and compliant with medications. The patient denies any depressive symptoms, SI, HI or AVH. Denies formal psychiatric diagnosis or past hospitalizations. In the setting of his HIV dementia and cerebral atrophy, discussed starting Seroquel 25 mg TID to assist with agitation/anxiety. Patient amenable to trial for symptomatic medication management. Patient seemed to be re-directable after administering agitation medications yesterday after attempt to leave AMA.    Interim evening history:  Nursing contact psychiatry provider, patient took scheduled Seroquel however refused as needed dose in the evening with increased agitation and "he was determined to discharge". Discontinued Seroquel and transitioned to Zyprexa to allow for PO and IV alternative if medication refusal. Give x1 dose of Zyprexa 5 mg this evening  and scheduled 10 mg at bedtime. Regimen for tomorrow will be Zyprexa 5 mg qAM and 5 mg qPM and 10 mg at bedtime. Discussed starting medication with sister, discussed risks, side effects, black box warning and benefits and she was amenable with medication recommendations.   Please see plan below for detailed recommendations.   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   Onychomycosis    Plan  ## Psychiatric Medication Recommendations:  --  Discontinued Seroquel once prn, due to continued agitation and patient refusal to take as needed --- Started on Seroquel 25 mg TID for agitation/anxiety this afternoon but discontinued after non-compliance with as needed dose,  and lack of IV alternative, in case of med refusal   -- Give x1 dose of Zyprexa 5 mg this evening and scheduled 10 mg at bedtime, to help with agitation, patient had good effect on Zyprexa day prior  -- Start the following regimen tomorrow --: Zyprexa 5 mg qAM and 5 mg qPM and 10 mg at bedtime   -- IV Zyprexa 5 mg BID prn, use only if patient refuses PO agitation meds  -- Discussed Zyprexa with sister, discussed risks, side effects, black box warning and benefits and she was amenable with medication recommendations.  -- Recommend disclosing prognosis and illness with family members if patient consents, appear to be supportive based on patient report and reviewed notes   -- Consider Palliative Consult    ## Medical Decision Making Capacity: Not specifically addressed in this encounter  ## Further Work-up:  While pt on Qtc prolonging medications, please monitor & replete K+ to 4 and Mg2+ to 2 -- most recent EKG on 1/8 had QtC of 454 -- Pertinent labwork reviewed earlier this admission includes:  Admission labs: NA 131,  Cr. 1.45, AST 85, Calcium 8.2, Bcx: Strep Pneumo, UDS negative, HIV reactive, CD 4 % 11.84, CD4 absolute 116 RPR reactive, T Pallium non reactive   LDL 79,   ## Disposition:-- There are no psychiatric contraindications to discharge at this time  ## Behavioral / Environmental: -Delirium Precautions: Delirium Interventions for Nursing and Staff: - RN to open blinds every AM. - To Bedside: Glasses, hearing aide, and pt's own shoes. Make available to patients. when possible and encourage use. -  Encourage po fluids when appropriate, keep fluids within reach. - OOB to chair with meals. - Passive ROM exercises to all extremities with AM & PM care. - RN to assess orientation to person, time and place QAM and PRN. - Recommend extended visitation hours with familiar family/friends as feasible. - Staff to minimize disturbances at night. Turn off television when pt asleep or when not in use. or Utilize compassion and acknowledge the patient's experiences while setting clear and realistic expectations for care.   ## Safety and Observation Level:  - Based on my clinical evaluation, I estimate the patient to be at low risk of self harm in the current setting. - At this time, we recommend  routine. This decision is based on my review of the chart including patient's history and current presentation, interview of the patient, mental status examination, and consideration of suicide risk including evaluating suicidal ideation, plan, intent, suicidal or self-harm behaviors, risk factors, and protective factors. This judgment is based on our ability to directly address suicide risk, implement suicide prevention strategies, and develop a safety plan while the patient is in the clinical setting. Please contact our team if there is a concern that risk level  has changed.  CSSR Risk Category:C-SSRS RISK CATEGORY: No Risk  Suicide Risk Assessment: Patient has following modifiable risk factors for suicide: lack of access to outpatient mental health resources, which we are addressing by providing outpatient resources in follow-up provider information. Patient has following non-modifiable or demographic risk factors for suicide: male gender Patient has the following protective factors against suicide: Supportive family, Supportive friends, Cultural, spiritual, or religious beliefs that discourage suicide, and no history of suicide attempts  Thank you for this consult request. Recommendations have been communicated to  the primary team.  We will sign off on the patient at this time, please re-consult as needed at this time.   Peterson Ao, MD       History of Present Illness  Relevant Aspects of Hospital Admitted on 02/03/2023 for acute septic encephalopathy  Patient Report:  Collected from the patient at bedside   On initial examination, patient is calmly laying in bed and cooperative during the interview. The Patient alert and oriented to location and person. Disoriented to month and year. He reports he can be irritable and anxious at times, but can't recall trying to leave AMA yesterday. The patient denies any depressive symptoms, difficulty with sleep or appetite. He denies SI and HI. Denies formal psychiatric diagnosis,  past hospitalizations or current psychiatric provider. Reports remote history of therapy to deal with "bad things" however, did not disclose further upon request.   Patient denies any symptoms suggestive of mania or psychotic symptoms ( paranoia, hallucinations, florid psychosis). Reports remote history of EtOH use, however hasn't engaged in drinking behavior in a "while". Denies any other illicit substances. Patient currently stays with family members (cousin or sister)   Psych ROS:  Depression: Denies Anxiety: Denies Mania (lifetime and current): Denies Psychosis: (lifetime and current): Denies   Collateral information:  Kerrie, Murie, [phone]  Last saw her brother on the 1/10. Marthe Patch he is improving and hasn't been able to visit him due to her current illness. Discussed increased agitation and patient wanting to leave during the prior and current evening shifts. Discussed starting zyprexa to help with symptomatic control. Discussed risks, benefits, side effects and black box warning. Sister amenable with zyprexa administration " Do whatever yall need to do" to keep him the hospital and safe.   Review of Systems  Psychiatric/Behavioral:  Negative for depression,  hallucinations, memory loss, substance abuse and suicidal ideas. The patient is nervous/anxious. The patient does not have insomnia.      Psychiatric and Social History  Psychiatric History:  Information collected from patient   Prev Dx/Sx: Denies  Current Psych Provider: Denies  Home Meds (current): Denies  Previous Med Trials: Denies  Therapy: Remote history, none currently   Prior Psych Hospitalization: Denies   Prior Self Harm: Denies  Prior Violence: Denies   Family Psych History: Denies  Family Hx suicide: Denies   Social History:  Developmental Hx: Denies  Educational Hx: 11th grade Occupational Hx: Unemployed, side jobs  Armed forces operational officer Hx: Denies  Living Situation: Family members, currently with cousin or sister  Spiritual Hx: Yes  Access to weapons/lethal means: Denies    Substance History Alcohol: Remote   Type of alcohol liquor and beer  Last Drink Unsure  Number of drinks per day Denies recent use  History of alcohol withdrawal seizures Denies  History of DT's Denies  Tobacco: Denies (remote use 2012 1 PPD per EMR)  Illicit drugs: Denies current use, ( Cocaine use in remote past per EMR)  Prescription  drug abuse: Denies  Rehab hx: Denies   Exam Findings  Physical Exam:  Vital Signs:  Temp:  [98.4 F (36.9 C)] 98.4 F (36.9 C) (01/16 0757) Pulse Rate:  [82] 82 (01/16 0757) Resp:  [16] 16 (01/16 0757) BP: (131)/(84) 131/84 (01/16 0757) SpO2:  [100 %] 100 % (01/16 0757) Blood pressure 131/84, pulse 82, temperature 98.4 F (36.9 C), temperature source Oral, resp. rate 16, height 5\' 6"  (1.676 m), weight 57.8 kg, SpO2 100%. Body mass index is 20.57 kg/m.  Physical Exam Constitutional:      Appearance: He is ill-appearing.  Pulmonary:     Effort: Pulmonary effort is normal.  Neurological:     Mental Status: He is disoriented.     Comments: Refer to mental status exam   Psychiatric:        Mood and Affect: Mood is anxious. Mood is not depressed.         Behavior: Behavior is not agitated, slowed, aggressive or withdrawn. Behavior is cooperative.        Thought Content: Thought content is not paranoid. Thought content does not include homicidal or suicidal ideation. Thought content does not include homicidal or suicidal plan.     Mental Status Exam: General Appearance: Disheveled hair, hospital gown, appears older than stated age, laying in bed   Orientation:  Other:  person and location  missed year, month and day   Memory:  Immediate;   Poor  Concentration:  Concentration: Fair  Recall:  Poor  Attention  Fair  Eye Contact:  Poor  Speech:  Normal Rate  Language:  Fair  Volume:  Normal  Mood: "alright"   Affect:  Congruent  Thought Process:  Coherent  Thought Content:  Logical  Suicidal Thoughts:  No  Homicidal Thoughts:  No  Judgement:  Poor  Insight:  Lacking  Psychomotor Activity:  Normal  Akathisia:  No  Fund of Knowledge:  NA      Assets:  Housing Social Support  Cognition:  Impaired, HIV Dementia and cerebral atrophy   ADL's:  Intact  AIMS (if indicated):        Other History   These have been pulled in through the EMR, reviewed, and updated if appropriate.  Family History:  The patient's family history is not on file.  Medical History: 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   Pneumococcal bacteremia 02/04/2023    Surgical History: Past Surgical History:  Procedure Laterality Date   ORIF ANKLE FRACTURE Right 06/03/2018   Procedure: OPEN REDUCTION INTERNAL FIXATION (ORIF) TRIMALLEOLAR ANKLE FRACTURE WITH SYNDESMOSIS;  Surgeon: Teryl Lucy, MD;  Location:  SURGERY CENTER;  Service: Orthopedics;  Laterality: Right;     Medications:   Current Facility-Administered Medications:    amLODipine  (NORVASC) tablet 5 mg, 5 mg, Oral, Daily, Bender, Emily, DO, 5 mg at 02/12/23 0800   amoxicillin (AMOXIL) capsule 1,000 mg, 1,000 mg, Oral, Q8H, Alexander-Savino, [place], MD, 1,000 mg at 02/12/23 0617   bictegravir-emtricitabine-tenofovir AF (BIKTARVY) 50-200-25 MG per tablet 1 tablet, 1 tablet, Oral, Daily, Daiva Eves, Lisette Grinder, MD, 1 tablet at 02/12/23 0800   feeding supplement (ENSURE ENLIVE / ENSURE PLUS) liquid 237 mL, 237 mL, Oral, BID BM, Hoffman, Erik C, DO, 237 mL at 02/10/23 1240   folic acid (FOLVITE) tablet 1 mg, 1 mg,  Oral, Daily, Marrianne Mood, MD, 1 mg at 02/12/23 0800   multivitamin with minerals tablet 1 tablet, 1 tablet, Oral, Daily, Marrianne Mood, MD, 1 tablet at 02/10/23 2211   OLANZapine (ZYPREXA) injection 5 mg, 5 mg, Intravenous, Q6H PRN, Gwenevere Abbot, MD, 5 mg at 02/11/23 2052   rosuvastatin (CRESTOR) tablet 5 mg, 5 mg, Oral, Daily, Bender, Emily, DO, 5 mg at 02/12/23 0800   senna (SENOKOT) tablet 8.6 mg, 1 tablet, Oral, Daily, Alexander-Savino, [place], MD   sodium chloride flush (NS) 0.9 % injection 3 mL, 3 mL, Intravenous, Q12H, Marrianne Mood, MD, 3 mL at 02/12/23 0806   sodium chloride flush (NS) 0.9 % injection 3 mL, 3 mL, Intravenous, PRN, Marrianne Mood, MD   sulfamethoxazole-trimethoprim (BACTRIM DS) 800-160 MG per tablet 1 tablet, 1 tablet, Oral, Daily, Bender, Emily, DO, 1 tablet at 02/12/23 0800   thiamine (VITAMIN B1) tablet 100 mg, 100 mg, Oral, Daily, 100 mg at 02/12/23 0800 **OR** thiamine (VITAMIN B1) injection 100 mg, 100 mg, Intravenous, Daily, Marrianne Mood, MD, 100 mg at 02/09/23 0941  Allergies: No Known Allergies  Peterson Ao, MD

## 2023-02-12 NOTE — Progress Notes (Signed)
Pt has refused night meds & vitals. Patient refused AM vitals.

## 2023-02-12 NOTE — Plan of Care (Signed)
  Problem: Health Behavior/Discharge Planning: Goal: Ability to manage health-related needs will improve Outcome: Progressing   Problem: Clinical Measurements: Goal: Ability to maintain clinical measurements within normal limits will improve Outcome: Progressing Goal: Will remain free from infection Outcome: Progressing Goal: Diagnostic test results will improve Outcome: Progressing Goal: Respiratory complications will improve Outcome: Progressing Goal: Cardiovascular complication will be avoided Outcome: Progressing   Problem: Activity: Goal: Risk for activity intolerance will decrease Outcome: Progressing   Problem: Nutrition: Goal: Adequate nutrition will be maintained Outcome: Progressing   Problem: Elimination: Goal: Will not experience complications related to bowel motility Outcome: Progressing Goal: Will not experience complications related to urinary retention Outcome: Progressing   Problem: Pain Management: Goal: General experience of comfort will improve Outcome: Progressing   Problem: Skin Integrity: Goal: Risk for impaired skin integrity will decrease Outcome: Progressing

## 2023-02-12 NOTE — TOC Progression Note (Addendum)
Transition of Care [facility]) - Progression Note    Patient Details  Name: Russell [name] MRN: [id number] Date of Birth: [birth year]  [facility] CM/SW Contact  Macky Galik A [place], [place] Phone Number: 02/12/2023, 5:10 PM  Clinical Narrative:     Update 1518 CSW reached out to pt's sister again regarding discharge. There was no answer. CSW continue to reach out and update on discharge plan.    CSW contacted pt's sister, no answer, CSW left VM with contact information to reach back out to CSW.    TOC will continue to follow.       Expected Discharge Plan and Services                                               Social Determinants of Health (SDOH) Interventions SDOH Screenings   Food Insecurity: No Food Insecurity (02/04/2023)  Housing: High Risk (02/04/2023)  Transportation Needs: No Transportation Needs (02/04/2023)  Utilities: Not At Risk (02/04/2023)  Tobacco Use: High Risk (02/03/2023)    Readmission Risk Interventions     No data to display

## 2023-02-12 NOTE — Progress Notes (Signed)
Occupational Therapy Treatment and DC Summary  Patient Details Name: Russell [name] MRN: [id number] DOB: 27-Sep-1965 Today's Date: 02/12/2023   History of present illness Pt is a 58 yo male who presented to [facility] ED on 02/03/23 with a chief complaint of fall and AMS, Pt with pneumococcal bacteremia and found to be HIV+. Pt seen by psychiatry and found unable to make own decisions.  PMH remote cocaine use and alcohol use.   OT comments  Pt cognition remains his biggest limiting factor, he needs some assist for proper ADL sequencing and planning. Overall he ambulates no AD + Supervision but does need cues to slow his pace before he trips and falls. His cognition is impaired to the point where he cannot recall his room and needs several cues to help reorient him, attempted to perform more bathing and hygiene with pt but he was adamantly refusing that idea. Based on his current function he was no current acute skilled OT needs as his cognition may or may not improve from here. In the event pt cognition improves to where he can start sequencing and perform tasks in an appropriate manner please reconsult OT.  OT signing off for now, DC plans remain appropriate for SNF.      If plan is discharge home, recommend the following:  Supervision due to cognitive status;Direct supervision/assist for financial management;Direct supervision/assist for medications management;A lot of help with bathing/dressing/bathroom;Assistance with cooking/housework   Equipment Recommendations  Other (comment) (defer to next level of care)    Recommendations for Other Services      Precautions / Restrictions Precautions Precautions: Fall Restrictions Weight Bearing Restrictions Per Provider Order: No       Mobility Bed Mobility               General bed mobility comments: sitting EOB on arrival    Transfers Overall transfer level: Needs assistance Equipment used: None Transfers: Sit to/from Stand Sit to Stand:  Supervision                 Balance Overall balance assessment: Needs assistance Sitting-balance support: No upper extremity supported, Feet supported Sitting balance-Leahy Scale: Good     Standing balance support: No upper extremity supported, During functional activity Standing balance-Leahy Scale: Fair                             ADL either performed or assessed with clinical judgement   ADL       Grooming: Standing;Supervision/safety;Wash/dry face Grooming Details (indicate cue type and reason): strong encouragement needed for pt to wash his hands         Upper Body Dressing : Minimal assistance;Sitting Upper Body Dressing Details (indicate cue type and reason): Pt initially putting on jacket backwards, readjusted jacket for pt with verbal cue for sleeves     Toilet Transfer: Supervision/safety;Ambulation Toilet Transfer Details (indicate cue type and reason): used toilet like a urinal                Extremity/Trunk Assessment              Vision       Perception     Praxis      Cognition Arousal: Alert Behavior During Therapy: Restless, Flat affect Overall Cognitive Status: No family/caregiver present to determine baseline cognitive functioning Area of Impairment: Orientation, Attention, Memory, Following commands, Safety/judgement, Awareness, Problem solving  Orientation Level: Disoriented to, Place, Situation Current Attention Level: Sustained Memory: Decreased recall of precautions, Decreased short-term memory Following Commands: Follows one step commands inconsistently, Follows one step commands with increased time Safety/Judgement: Decreased awareness of safety, Decreased awareness of deficits Awareness: Intellectual Problem Solving: Slow processing, Decreased initiation, Difficulty sequencing, Requires verbal cues, Requires tactile cues General Comments: Pt having hard time processing and recalling the  requests of staff members, gave phlebotomy hard time after they explained multiple times to roll back sleeves to get his blood. Pt also limited in topographical orientation, after mobilizing we returned to room and he kept stating that this is not his room and that this is not right for [place] to be here, despite that pt proceeded to use the bathroom, was very hard to reorient him to his room. Encouraged pt to bathe with staff, he stated "Its too cold to be going out there" despite providing options for pt to have a warm sponge bath. He even claimed to have bathed already when he in fact did not per staff.        Exercises      Shoulder Instructions       General Comments      Pertinent Vitals/ Pain       Pain Assessment Pain Assessment: No/denies pain  Home Living                                          Prior Functioning/Environment              Frequency  Min 1X/week        Progress Toward Goals  OT Goals(current goals can now be found in the care plan section)  Progress towards OT goals: Goals met/education completed, patient discharged from OT;Not progressing toward goals - comment (pt cognition is biggest limitation)  Acute Rehab OT Goals OT Goal Formulation: All assessment and education complete, DC therapy Time For Goal Achievement: 02/18/23 Potential to Achieve Goals: Fair  Plan      Co-evaluation                 AM-PAC OT "6 Clicks" Daily Activity     Outcome Measure   Help from another person eating meals?: None Help from another person taking care of personal grooming?: A Little Help from another person toileting, which includes using toliet, bedpan, or urinal?: A Little Help from another person bathing (including washing, rinsing, drying)?: A Lot Help from another person to put on and taking off regular upper body clothing?: A Little Help from another person to put on and taking off regular lower body clothing?: A Lot 6 Click  Score: 17    End of Session    OT Visit Diagnosis: Other symptoms and signs involving cognitive function   Activity Tolerance Patient tolerated treatment well   Patient Left in bed;with call bell/phone within reach;with nursing/sitter in room   Nurse Communication Mobility status        Time: 3875-6433 OT Time Calculation (min): 24 min  Charges: OT General Charges $OT Visit: 1 Visit OT Treatments $Therapeutic Activity: 23-37 mins  02/12/2023  AB, OTR/L  Acute Rehabilitation Services  Office: [phone]   Tristan Schroeder 02/12/2023, 6:08 PM

## 2023-02-12 NOTE — Plan of Care (Signed)
  Problem: Acute Rehab OT Goals (only OT should resolve) Goal: Pt. Will Perform Grooming Outcome: Adequate for Discharge   Problem: Acute Rehab OT Goals (only OT should resolve) Goal: Pt. Will Perform Upper Body Dressing Outcome: Not Met (add Reason) Note: Limited by his cognition Goal: Pt. Will Perform Lower Body Dressing Outcome: Not Met (add Reason) Note: Limited by cognition   Problem: Acute Rehab OT Goals (only OT should resolve) Goal: OT Additional ADL Goal #1 Outcome: Not Met (add Reason) Note: Limited by his cognition

## 2023-02-13 DIAGNOSIS — G9340 Encephalopathy, unspecified: Secondary | ICD-10-CM | POA: Diagnosis not present

## 2023-02-13 DIAGNOSIS — B953 Streptococcus pneumoniae as the cause of diseases classified elsewhere: Secondary | ICD-10-CM | POA: Diagnosis not present

## 2023-02-13 DIAGNOSIS — B20 Human immunodeficiency virus [HIV] disease: Secondary | ICD-10-CM | POA: Diagnosis not present

## 2023-02-13 DIAGNOSIS — F0390 Unspecified dementia without behavioral disturbance: Secondary | ICD-10-CM | POA: Diagnosis not present

## 2023-02-13 DIAGNOSIS — R7881 Bacteremia: Secondary | ICD-10-CM | POA: Diagnosis not present

## 2023-02-13 DIAGNOSIS — J154 Pneumonia due to other streptococci: Secondary | ICD-10-CM | POA: Diagnosis not present

## 2023-02-13 DIAGNOSIS — B182 Chronic viral hepatitis C: Secondary | ICD-10-CM | POA: Diagnosis not present

## 2023-02-13 NOTE — Progress Notes (Signed)
Physical Therapy Treatment Patient Details Name: Russell [name] MRN: [id number] DOB: 1965-10-27 Today's Date: 02/13/2023   History of Present Illness Pt is a 58 yo male who presented to [facility] ED on 02/03/23 with a chief complaint of fall and AMS, Pt with pneumococcal bacteremia and found to be HIV+. Pt seen by psychiatry and found unable to make own decisions.  PMH remote cocaine use and alcohol use.    PT Comments  Remains limited by poor cognition, reduced safety awareness. Up to min assist today for sitting down safely on various surface with poor awareness of alignment and proximity to surface. Needs constantly redirected due to internal and external distractions, decreased short term memory, and is easily agitated, but also easily redirected. CGA for safety with gait, demonstrating mild instability, holding furniture intermittently for support. NT assisted with hygiene and redirection while care provided as a multidisciplinary approach to maximize visit. Patient will continue to benefit from skilled physical therapy services to further improve independence with functional mobility.   Psych mentioned memory care resources/facility in in their most recent visit. This may be a potential option pending prognosis. I feel he does need some follow-up/continued physical therapy to reinforce safety and automaticity with functional activities to reduce fall risk and improve independence, but again this is primarily limited by cognition right now.   If plan is discharge home, recommend the following: A lot of help with bathing/dressing/bathroom;Assistance with cooking/housework;Direct supervision/assist for medications management;Direct supervision/assist for financial management;Assist for transportation;Help with stairs or ramp for entrance;Supervision due to cognitive status;A little help with walking and/or transfers   Can travel by private vehicle     No  Equipment Recommendations  None recommended by PT     Recommendations for Other Services       Precautions / Restrictions Precautions Precautions: Fall Restrictions Weight Bearing Restrictions Per Provider Order: No     Mobility  Bed Mobility Overal bed mobility: Needs Assistance Bed Mobility: Sit to Supine, Supine to Sit     Supine to sit: Contact guard Sit to supine: Contact guard assist   General bed mobility comments: CGA for safety, requires constant redirection to perform desired task.    Transfers Overall transfer level: Needs assistance Equipment used: None Transfers: Sit to/from Stand Sit to Stand: Min assist           General transfer comment: Cuesfor safety, and for awareness of alignment when lowering to bed and chair to sit, nearly missing twice today until corrected by therapist.    Ambulation/Gait Ambulation/Gait assistance: Contact guard assist Gait Distance (Feet): 20 Feet (+20) Assistive device: None Gait Pattern/deviations: Step-through pattern, Drifts right/left Gait velocity: decr Gait velocity interpretation: <1.8 ft/sec, indicate of risk for recurrent falls   General Gait Details: CGA for safety at all times, minor drift and instability, reaching for furniture as needed. Needs redirected for tasks and move. Remained in room for safety initially due to concern of bowel incontinence several times with staff recently. After hygiene performed pt declined ambulating in hallway.   Stairs             Wheelchair Mobility     Tilt Bed    Modified Rankin (Stroke Patients Only)       Balance Overall balance assessment: Needs assistance Sitting-balance support: No upper extremity supported, Feet supported Sitting balance-Leahy Scale: Good Sitting balance - Comments: Can reach forward/down to put shoes on   Standing balance support: No upper extremity supported, During functional activity Standing balance-Leahy Scale: Fair  Standing balance comment: CGA for static standing                             Cognition Arousal: Alert Behavior During Therapy: Restless, Agitated Overall Cognitive Status: No family/caregiver present to determine baseline cognitive functioning                                 General Comments: Easily distracted, not oriented to location, date or situation. Needs constant redirection to facilitate and complete simple tasks, reduced safety awareness.        Exercises      General Comments General comments (skin integrity, edema, etc.): NT assisted with hygiene and frequent redirection      Pertinent Vitals/Pain Pain Assessment Pain Assessment: No/denies pain    Home Living                          Prior Function            PT Goals (current goals can now be found in the care plan section) Acute Rehab PT Goals Patient Stated Goal: none stated PT Goal Formulation: Patient unable to participate in goal setting Time For Goal Achievement: 02/23/23 Potential to Achieve Goals: Fair Progress towards PT goals: Progressing toward goals    Frequency    Min 1X/week      PT Plan      Co-evaluation              AM-PAC PT "6 Clicks" Mobility   Outcome Measure  Help needed turning from your back to your side while in a flat bed without using bedrails?: A Little Help needed moving from lying on your back to sitting on the side of a flat bed without using bedrails?: A Little Help needed moving to and from a bed to a chair (including a wheelchair)?: A Little Help needed standing up from a chair using your arms (e.g., wheelchair or bedside chair)?: A Little Help needed to walk in hospital room?: A Little Help needed climbing 3-5 steps with a railing? : A Lot 6 Click Score: 17    End of Session Equipment Utilized During Treatment: Gait belt Activity Tolerance: Patient tolerated treatment well Patient left: in bed;with call bell/phone within reach;with bed alarm set;with nursing/sitter in room    PT Visit Diagnosis: Unsteadiness on feet (R26.81);Muscle weakness (generalized) (M62.81);Difficulty in walking, not elsewhere classified (R26.2);Other symptoms and signs involving the nervous system (R29.898)     Time: 5956-3875 PT Time Calculation (min) (ACUTE ONLY): 20 min  Charges:    $Therapeutic Activity: 8-22 mins PT General Charges $$ ACUTE PT VISIT: 1 Visit                     Kathlyn Sacramento, PT, DPT [facility]  Rehabilitation Services Physical Therapist Office: [phone] Website: [email or web address]    Berton Mount 02/13/2023, 2:24 PM

## 2023-02-13 NOTE — Progress Notes (Signed)
[facility]   Reason for visit: Follow-up on HIV and pneumococcal bacteremia.  Interval History: He has no complaints today, just asked to, 'get some rest' With a sitter due to agitation and receiving Zyprexa.  Physical Exam: Constitutional:  Vitals:   02/12/23 2115 02/13/23 0907  BP: (!) 142/90 (!) 152/110  Pulse: 89 97  Resp: 17 19  Temp: 98.8 F (37.1 C) 97.9 F (36.6 C)  SpO2: 100% 100%  Patient is in no acute distress. Respiratory: Respiratory effort  Review of Systems: Constitutional: Negative for fever or chills.  Lab Results  Component Value Date   WBC 3.4 (L) 02/12/2023   HGB 12.3 (L) 02/12/2023   HCT 35.6 (L) 02/12/2023   MCV 98.3 02/12/2023   PLT 73 (L) 02/12/2023    Lab Results  Component Value Date   CREATININE 1.54 (H) 02/12/2023   BUN 12 02/12/2023   NA 129 (L) 02/12/2023   K 4.0 02/12/2023   CL 105 02/12/2023   CO2 17 (L) 02/12/2023    Lab Results  Component Value Date   ALT 31 02/12/2023   AST 77 (H) 02/12/2023   ALKPHOS 74 02/12/2023     Microbiology: Recent Results (from the past 240 hours)  Culture, blood (Routine X 2) w Reflex to ID Panel     Status: None   Collection Time: 02/05/23  5:03 PM   Specimen: BLOOD LEFT ARM  Result Value Ref Range Status   Specimen Description BLOOD LEFT ARM  Final   Special Requests   Final    BOTTLES DRAWN AEROBIC ONLY Blood Culture results may not be optimal due to an inadequate volume of blood received in culture bottles   Culture   Final    NO GROWTH 5 DAYS Performed at [facility], [address], [place], [place] [address]    Report Status 02/10/2023 FINAL  Final  Culture, blood (Routine X 2) w Reflex to ID Panel     Status: None   Collection Time: 02/05/23  5:03 PM   Specimen: BLOOD  Result Value Ref Range Status   Specimen Description BLOOD SITE NOT SPECIFIED  Final   Special Requests   Final    BOTTLES DRAWN AEROBIC ONLY Blood Culture results may not be  optimal due to an inadequate volume of blood received in culture bottles   Culture   Final    NO GROWTH 5 DAYS Performed at [facility], [address] [place], [place] [address]    Report Status 02/10/2023 FINAL  Final  Resp panel by RT-PCR (RSV, Flu A&B, Covid) Anterior Nasal Swab     Status: None   Collection Time: 02/11/23 11:45 AM   Specimen: Anterior Nasal Swab  Result Value Ref Range Status   SARS Coronavirus 2 by RT PCR NEGATIVE NEGATIVE Final   Influenza A by PCR NEGATIVE NEGATIVE Final   Influenza B by PCR NEGATIVE NEGATIVE Final    Comment: (NOTE) The Xpert Xpress SARS-CoV-2/FLU/RSV plus assay is intended as an aid in the diagnosis of influenza from Nasopharyngeal swab specimens and should not be used as a sole basis for treatment. Nasal washings and aspirates are unacceptable for Xpert Xpress SARS-CoV-2/FLU/RSV testing.  Fact Sheet for Patients: BloggerCourse.com  Fact Sheet for Healthcare Providers: SeriousBroker.it  This test is not yet approved or cleared by the [place] FDA and has been authorized for detection and/or diagnosis of SARS-CoV-2 by FDA under an Emergency Use Authorization (EUA).  This EUA will remain in effect (meaning this test can be used) for the duration of the COVID-19 declaration under Section 564(b)(1) of the Act, 21 U.S.C. section 360bbb-3(b)(1), unless the authorization is terminated or revoked.     Resp Syncytial Virus by PCR NEGATIVE NEGATIVE Final    Comment: (NOTE) Fact Sheet for Patients: BloggerCourse.com  Fact Sheet for Healthcare Providers: SeriousBroker.it  This test is not yet approved or cleared by the [place] FDA and has been authorized for detection and/or diagnosis of SARS-CoV-2 by FDA under an Emergency Use Authorization (EUA). This EUA will remain in effect (meaning this test can be used) for the  duration of the COVID-19 declaration under Section 564(b)(1) of the Act, 21 U.S.C. section 360bbb-3(b)(1), unless the authorization is terminated or revoked.  Performed at [facility], [address], [place], [place] [address]   Culture, blood (Routine X 2) w Reflex to ID Panel     Status: None (Preliminary result)   Collection Time: 02/11/23  3:17 PM   Specimen: BLOOD  Result Value Ref Range Status   Specimen Description BLOOD SITE NOT SPECIFIED  Final   Special Requests   Final    BOTTLES DRAWN AEROBIC AND ANAEROBIC Blood Culture results may not be optimal due to an inadequate volume of blood received in culture bottles   Culture   Final    NO GROWTH 2 DAYS Performed at [facility], [address] [place], [place] [address]    Report Status PENDING  Incomplete  Culture, blood (Routine X 2) w Reflex to ID Panel     Status: None (Preliminary result)   Collection Time: 02/11/23  3:17 PM   Specimen: BLOOD  Result Value Ref Range Status   Specimen Description BLOOD SITE NOT SPECIFIED  Final   Special Requests   Final    BOTTLES DRAWN AEROBIC AND ANAEROBIC Blood Culture results may not be optimal due to an inadequate volume of blood received in culture bottles   Culture   Final    NO GROWTH 2 DAYS Performed at [facility], [address] [place], [place] [address]    Report Status PENDING  Incomplete    Impression/Plan:  1.  Pneumococcal bacteremia.  He is doing well with no further chills or new complaints.  Amoxicillin has been discontinued.  Follow-up blood cultures remain no growth to date.  2.  HIV.  This is a new diagnosis this hospitalization.  He is doing well on Biktarvy with no complaints.  No changes  3.  At risk for opportunistic infections.  He continues on Bactrim for prophylaxis and will continue.  4.  HIV related dementia.  Seen by psychiatry and now getting Zyprexa.  Does not have capacity make decisions.  Primary team looking for  placement options for him  5.  Disposition.  Looking for options for disposition for him.  Will have him follow-up in our clinic after discharge    Dr. Renold Don available over the weekend if needed otherwise ID team will follow-up on Monday.

## 2023-02-13 NOTE — Progress Notes (Signed)
Summary: Russell [name] is a 58 y.o. male with remote history of cocaine use disorder and housing instability presenting with deteriorating mental status and admitted for septic encephalopathy.   Hospital day: 10  Subjective:  Patient seen and evaluated while resting in bed. Patient reports he is doing ok. He denies nasal drainage, CP, SOB or abdominal pain. He reports he had a bowel movement yesterday. Patient did not realize today is his birthday. He reports he is 58 [birth year] today (turns 58 today).  Objective:  Vital signs in last 24 hours: Vitals:   02/10/23 2334 02/11/23 0736 02/12/23 0757 02/12/23 2115  BP: (!) 116/91 119/73 131/84 (!) 142/90  Pulse: 76 95 82 89  Resp: 18 18 16 17   Temp: 98.4 F (36.9 C) 98 F (36.7 C) 98.4 F (36.9 C) 98.8 F (37.1 C)  TempSrc: Oral  Oral Oral  SpO2: 100% 100% 100% 100%  Weight:      Height:       Physical Exam General: NAD, tired-appearing, cooperative Cardiovascular: RRR, no murmurs, rubs or gallops Respiratory: normal work of breathing, clear to auscultation bilaterally Extremities: able to move all four extremities Neurological: alert, some confusion, oriented to person and place   Weight change:   Intake/Output Summary (Last 24 hours) at 02/13/2023 1610 Last data filed at 02/12/2023 0849 Gross per 24 hour  Intake 360 ml  Output 200 ml  Net 160 ml   Assessment/Plan:  Principal Problem:   Pneumococcal bacteremia Active Problems:   Sepsis (HCC)   Fall   Encephalopathy   Community acquired pneumonia   Thrombocytopenia (HCC)   Hematuria   Homelessness   Alcoholism (HCC)   AIDS (HCC)   Positive RPR test   Cholelithiasis without cholecystitis   Aortic atherosclerosis (HCC)   Cerebral atrophy (HCC)   Horseshoe kidney   Malnutrition of moderate degree   HIV dementia (HCC)   HCV (hepatitis C virus)   Pulmonary hypertension (HCC)   Right ventricular systolic dysfunction   Biological false positive RPR test    Onychomycosis  Russell [name] is a 58 y.o. male with remote history of cocaine use disorder and housing instability presenting with deteriorating mental status and admitted for septic encephalopathy on hospital day 10.   Acute encephalopathy  Severe sepsis 2/2 pneumococcal bacteremia CAP AIDS HIV Dementia  Cerebral Atrophy  Delirium Patient more cooperative and lucid on exam today. Did not realize today is his birthday. Oriented to person and place. Psychiatry following, appreciate recommendations. They scheduled Zyprexa and stopped the seroquel last night. They noted patient would benefit from having resources for memory care facilities if the family desires that support. Sister to be contacted regarding disposition   - Psychiatry following, appreciate recommendations - Zyprexa 5 mg BID and 10 mg at bedtime  - Continue Biktarvy, DS Bactrim daily for PJP prophylaxis  - Continue Crestor 5 mg - Delirium precautions - TOC following, appreciate assistance    HTN BP elevated overnight (142-152/90-110). - Continue to monitor BP - Continue amlodipine 5 mg for chronic BP control    HCV  Patient positive for Hep C with viral load of 19,300,000. RUQ U/S revealed possible early cirrhosis, no suspicious hepatic lesion, cholelithiasis without sonographic findings to suggest acute cholecystitis and an 1.8 cm polypoid lesion in the gallbladder fundus that was poorly evaluated on prior CT due to motion degradation. - Consider follow-up MRI abdomen with/without contrast in 3-4 weeks per radiology - Recommend outpatient treatment  Thrombocytopenia Platelets 73 yesterday. Improved. Continue to suspect thrombocytopenia 2/2 to Hep C and HIV.    Severely reduced right ventricular systolic function Pulmonary Artery HTN Cardiology consulted, appreciate recommendations. They do not think he is a candidate for [facility] catheterization or aggressive cardiac therapies. Cardiology does not recommend cardiology  follow-up at this time.    Decreased Bowel Movements Patient endorses BM today. Continue senna. Will continue to monitor.    VTE Prophylaxis: SCDs Diet: Regular  IVF: N/A Anticipated Discharge Location: SNF or home    Dispo: Anticipated discharge in approximately 1-2 days.   LOS: 10 days   [name], Medical Student 02/13/2023, 6:21 AM

## 2023-02-13 NOTE — Progress Notes (Signed)
Patient agitated, unable to be redirected, is confused and getting out of bed, stating and repeating "I have to go, I have to go to work". He walked into the bathroom, urinated on the floor, and didn't use the toilet. Patient walked back to bed, continued to be agitated, and wanting to go outside. He refused all scheduled PO medications and due to increasing agitation was given prn IM zyprexa. Patient continues to attempt to get out of bed to go outside and is redirected. Observation sitter at bedside. Safety precautions continued.

## 2023-02-13 NOTE — Consult Note (Signed)
Russell [name]  Patient Name: .VENARD LESCARBEAU  MRN: [id number]  DOB: 1965/04/07  Consult Order details:  Orders (From admission, onward)     Start     Ordered   02/11/23 1440  IP CONSULT TO PSYCHIATRY       Comments: Patient has acute encephalopathy, recent diagnosed HIV. Had recent strep pneumonia and bacteremia. We have IVC'ed him because he does not have capacity, agitated, would like assistance in treating his encephalopathy.  Ordering Provider: Manuela Neptune, MD  Provider:  (Not yet assigned)  Question Answer Comment  Location [facility]   Reason for Consult? agitation      02/11/23 1441   02/05/23 1517  IP CONSULT TO PSYCHIATRY       Comments: Admitted for sepsis due to S. Pneumoniae bacteremia, also HIV positive, positive RPR. Waxing and waning mental status, at times seems to be having visual hallucinations. Infection is improving but mental status is still poor. MRI shows advanced cerebral atrophy for age.  Ordering Provider: Marrianne Mood, MD  Provider:  (Not yet assigned)  Question Answer Comment  Location [facility]   Reason for Consult? Delirium versus underlying psychotic disorder      02/05/23 1519             Mode of Visit: In person    Psychiatry Consult Evaluation  Service Date: February 13, 2023 LOS:  LOS: 10 days  Chief Complaint   Primary Psychiatric Diagnoses  Acute agitation   Assessment  Russell [name] is a 58 y.o. male admitted for septic encephalopathy 02/03/2023  9:55 AM. He carries no formal psychiatric illness and has a past medical history of HIV dementia, cerebral atrophy, AIDS, Pulmonary HTN, HTN, severely reduced RV funx, thrombocytopenia and Hepatitis C.   On initial examination, patient is calmly laying in bed and cooperative during the interview. The Patient alert and oriented to location and person. Disoriented to month and year. Reports can be irritable and  anxious at times, but can't recall trying to leave AMA yesterday. Patient received agitation medications yesterday, was redirected to the room and refused medications overnight per chart review.   Per nursing staff, patient continued to be agitated overnight, unable to be redirected, getting OOB and wanting to go outside. Refused scheduled agitation meds and received IV Zyprexa prns x2  On morning assessment, Patient oriented to person and location. He is calm, with covers over his head and able to participate during interview. Compliant with morning Zyprexa dose. Patient appears to get more agitated during afternoon and evening hours. Will continue scheduled zyprexa doses to target symptoms prior to onset.     Please see plan below for detailed recommendations.   Diagnoses:  Active Hospital problems: Principal Problem:   Pneumococcal bacteremia Active Problems:   Sepsis (HCC)   Fall   Encephalopathy   Community acquired pneumonia   Thrombocytopenia (HCC)   Hematuria   Homelessness   Alcoholism (HCC)   AIDS (HCC)   Positive RPR test   Cholelithiasis without cholecystitis   Aortic atherosclerosis (HCC)   Cerebral atrophy (HCC)   Horseshoe kidney   Malnutrition of moderate degree   HIV dementia (HCC)   HCV (hepatitis C virus)   Pulmonary hypertension (HCC)   Right ventricular systolic dysfunction   Biological false positive RPR test   Onychomycosis    Plan  ## Psychiatric Medication Recommendations:  - Continue with Zyprexa 5 mg qAM and 5 mg qPM and 10  mg at bedtime, to intermittent agitation, appears symptoms get worse in the evening  -- IV Zyprexa 5 mg BID prn, use only if patient refuses PO agitation meds  -- Discussed Zyprexa with sister, discussed risks, side effects, black box warning and benefits and she was amenable with medication recommendations.  -- Recommend disclosing prognosis and illness with family members if patient consents, appear to be supportive based on  patient report and reviewed notes   -- Consider Palliative Consult    ## Medical Decision Making Capacity: Not specifically addressed in this encounter  ## Further Work-up:  While pt on Qtc prolonging medications, please monitor & replete K+ to 4 and Mg2+ to 2 -- most recent EKG on 1/8 had QtC of 454 -- Pertinent labwork reviewed earlier this admission includes:  Admission labs: NA 131,  Cr. 1.45, AST 85, Calcium 8.2, Bcx: Strep Pneumo, UDS negative, HIV reactive, CD 4 % 11.84, CD4 absolute 116 RPR reactive, T Pallium non reactive   LDL 79,   ## Disposition:-- There are no psychiatric contraindications to discharge at this time  ## Behavioral / Environmental: -Delirium Precautions: Delirium Interventions for Nursing and Staff: - RN to open blinds every AM. - To Bedside: Glasses, hearing aide, and pt's own shoes. Make available to patients. when possible and encourage use. - Encourage po fluids when appropriate, keep fluids within reach. - OOB to chair with meals. - Passive ROM exercises to all extremities with AM & PM care. - RN to assess orientation to person, time and place QAM and PRN. - Recommend extended visitation hours with familiar family/friends as feasible. - Staff to minimize disturbances at night. Turn off television when pt asleep or when not in use. or Utilize compassion and acknowledge the patient's experiences while setting clear and realistic expectations for care.   ## Safety and Observation Level:  - Based on my clinical evaluation, I estimate the patient to be at low risk of self harm in the current setting. - At this time, we recommend  1:1 Observation. This decision is based on my review of the chart including patient's history and current presentation, interview of the patient, mental status examination, and consideration of suicide risk including evaluating suicidal ideation, plan, intent, suicidal or self-harm behaviors, risk factors, and protective factors. This judgment  is based on our ability to directly address suicide risk, implement suicide prevention strategies, and develop a safety plan while the patient is in the clinical setting. Please contact our team if there is a concern that risk level has changed.  CSSR Risk Category:C-SSRS RISK CATEGORY: No Risk  Suicide Risk Assessment: Patient has following modifiable risk factors for suicide: lack of access to outpatient mental health resources, which we are addressing by providing outpatient resources in [name] provider information. Patient has following non-modifiable or demographic risk factors for suicide: male gender Patient has the following protective factors against suicide: Supportive family, Supportive friends, Cultural, spiritual, or religious beliefs that discourage suicide, and no history of suicide attempts  Thank you for this consult request. Recommendations have been communicated to the primary team.  We will continue to follow at this time.   Peterson Ao, MD       History of Present Illness  Relevant Aspects of Hospital Admitted on 02/03/2023 for acute septic encephalopathy  Patient Report:  Collected from the patient at bedside   On morning assessment, patient is calmly laying in bed, covers over head and cooperative during the interview. The Patient alert and oriented to  location and person. The patient denies any depressive symptoms, difficulty with sleep or appetite. He denies SI and HI. Reports he refuses evening medications because they make him sleepy.   Psych ROS:  Depression: Denies Anxiety: Denies Mania (lifetime and current): Denies Psychosis: (lifetime and current): Denies   Collateral information:  Aithan, [place]accam, [phone] (contacted on 1/16)   Last saw her brother on the 1/10. Marthe Patch he is improving and hasn't been able to visit him due to her current illness. Discussed increased agitation and patient wanting to leave during the prior and current evening  shifts. Discussed starting zyprexa to help with symptomatic control. Discussed risks, benefits, side effects and black box warning. Sister amenable with zyprexa administration " Do whatever yall need to do" to keep him the hospital and safe.   Review of Systems  Psychiatric/Behavioral:  Negative for depression, hallucinations, memory loss, substance abuse and suicidal ideas. The patient is not nervous/anxious and does not have insomnia.      Psychiatric and Social History  Psychiatric History:  Information collected from patient   Prev Dx/Sx: Denies  Current Psych Provider: Denies  Home Meds (current): Denies  Previous Med Trials: Denies  Therapy: Remote history, none currently   Prior Psych Hospitalization: Denies   Prior Self Harm: Denies  Prior Violence: Denies   Family Psych History: Denies  Family Hx suicide: Denies   Social History:  Developmental Hx: Denies  Educational Hx: 11th grade Occupational Hx: Unemployed, side jobs  Armed forces operational officer Hx: Denies  Living Situation: Family members, currently with cousin or sister  Spiritual Hx: Yes  Access to weapons/lethal means: Denies    Substance History Alcohol: Remote   Type of alcohol liquor and beer  Last Drink Unsure  Number of drinks per day Denies recent use  History of alcohol withdrawal seizures Denies  History of DT's Denies  Tobacco: Denies (remote use 2012 1 PPD per EMR)  Illicit drugs: Denies current use, ( Cocaine use in remote past per EMR)  Prescription drug abuse: Denies  Rehab hx: Denies   Exam Findings  Physical Exam:  Vital Signs:  Temp:  [98.8 F (37.1 C)] 98.8 F (37.1 C) (01/16 2115) Pulse Rate:  [89] 89 (01/16 2115) Resp:  [17] 17 (01/16 2115) BP: (142)/(90) 142/90 (01/16 2115) SpO2:  [100 %] 100 % (01/16 2115) Blood pressure (!) 142/90, pulse 89, temperature 98.8 F (37.1 C), temperature source Oral, resp. rate 17, height 5\' 6"  (1.676 m), weight 57.8 kg, SpO2 100%. Body mass index is 20.57  kg/m.  Physical Exam Constitutional:      Appearance: He is ill-appearing.  Pulmonary:     Effort: Pulmonary effort is normal.  Neurological:     Mental Status: He is disoriented.     Comments: Refer to mental status exam   Psychiatric:        [name] and Affect: [name] is not anxious or depressed.        Behavior: Behavior is not agitated, slowed, aggressive or withdrawn. Behavior is cooperative.        Thought Content: Thought content is not paranoid. Thought content does not include homicidal or suicidal ideation. Thought content does not include homicidal or suicidal plan.     Mental Status Exam: General Appearance: Disheveled hair, hospital gown, appears older than stated age, laying in bed   Orientation:  Other:  person and location  missed year, month and day   Memory:  Immediate;   Poor  Concentration:  Concentration: Fair  Recall:  Poor  Attention  Fair  Eye Contact:  Poor  Speech:  Normal Rate  Language:  Fair  Volume:  Normal  [name]: "fine"   Affect:  Congruent  Thought Process:  Coherent  Thought Content:  Logical  Suicidal Thoughts:  No  Homicidal Thoughts:  No  Judgement:  Poor  Insight:  Lacking  Psychomotor Activity:  Normal  Akathisia:  No  Fund of Knowledge:  NA      Assets:  Housing Social Support  Cognition:  Impaired, HIV Dementia and cerebral atrophy   ADL's:  Intact  AIMS (if indicated):        Other History   These have been pulled in through the EMR, reviewed, and updated if appropriate.  Family History:  The patient's family history is not on file.  Medical History: 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   Pneumococcal bacteremia 02/04/2023    Surgical History: Past Surgical History:  Procedure Laterality Date   ORIF ANKLE  FRACTURE Right 06/03/2018   Procedure: OPEN REDUCTION INTERNAL FIXATION (ORIF) TRIMALLEOLAR ANKLE FRACTURE WITH SYNDESMOSIS;  Surgeon: Teryl Lucy, MD;  Location: [facility];  Service: Orthopedics;  Laterality: Right;     Medications:   Current Facility-Administered Medications:    amLODipine (NORVASC) tablet 5 mg, 5 mg, Oral, Daily, Bender, Emily, DO, 5 mg at 02/12/23 0800   bictegravir-emtricitabine-tenofovir AF (BIKTARVY) 50-200-25 MG per tablet 1 tablet, 1 tablet, Oral, Daily, Daiva Eves, Lisette Grinder, MD, 1 tablet at 02/12/23 0800   feeding supplement (ENSURE ENLIVE / ENSURE PLUS) liquid 237 mL, 237 mL, Oral, BID BM, Hoffman, Erik C, DO, 237 mL at 02/10/23 1240   folic acid (FOLVITE) tablet 1 mg, 1 mg, Oral, Daily, Russell Mood, MD, 1 mg at 02/12/23 0800   multivitamin with minerals tablet 1 tablet, 1 tablet, Oral, Daily, Russell Mood, MD, 1 tablet at 02/10/23 2211   OLANZapine (ZYPREXA) injection 5 mg, 5 mg, Intramuscular, BID PRN, Peterson Ao, MD, 5 mg at 02/12/23 2302   OLANZapine zydis (ZYPREXA) disintegrating tablet 10 mg, 10 mg, Oral, QHS, Peterson Ao, MD   OLANZapine zydis (ZYPREXA) disintegrating tablet 5 mg, 5 mg, Oral, BID, Peterson Ao, MD   OLANZapine zydis (ZYPREXA) disintegrating tablet 5 mg, 5 mg, Oral, Once, Peterson Ao, MD   rosuvastatin (CRESTOR) tablet 5 mg, 5 mg, Oral, Daily, Bender, Emily, DO, 5 mg at 02/12/23 0800   senna (SENOKOT) tablet 8.6 mg, 1 tablet, Oral, Daily, Alexander-Savino, [place], MD   sodium chloride flush (NS) 0.9 % injection 3 mL, 3 mL, Intravenous, Q12H, Russell Mood, MD, 3 mL at 02/12/23 0806   sodium chloride flush (NS) 0.9 % injection 3 mL, 3 mL, Intravenous, PRN, Russell Mood, MD   sterile water (preservative free) injection, , , ,    sulfamethoxazole-trimethoprim (BACTRIM DS) 800-160 MG per tablet 1 tablet, 1 tablet, Oral, Daily, Bender, Emily, DO, 1 tablet at 02/12/23 0800   thiamine (VITAMIN  B1) tablet 100 mg, 100 mg, Oral, Daily, 100 mg at 02/12/23 0800 **OR** thiamine (VITAMIN B1) injection 100 mg, 100 mg, Intravenous, Daily, Russell Mood, MD, 100 mg at 02/09/23 0941  Allergies: No Known Allergies  Peterson Ao, MD

## 2023-02-14 DIAGNOSIS — R451 Restlessness and agitation: Secondary | ICD-10-CM

## 2023-02-14 DIAGNOSIS — R7881 Bacteremia: Secondary | ICD-10-CM | POA: Diagnosis not present

## 2023-02-14 DIAGNOSIS — B953 Streptococcus pneumoniae as the cause of diseases classified elsewhere: Secondary | ICD-10-CM | POA: Diagnosis not present

## 2023-02-14 DIAGNOSIS — B20 Human immunodeficiency virus [HIV] disease: Secondary | ICD-10-CM | POA: Diagnosis not present

## 2023-02-14 DIAGNOSIS — F0280 Dementia in other diseases classified elsewhere without behavioral disturbance: Secondary | ICD-10-CM | POA: Diagnosis not present

## 2023-02-14 LAB — CBC WITH DIFFERENTIAL/PLATELET
Abs Immature Granulocytes: 0.01 10*3/uL (ref 0.00–0.07)
Basophils Absolute: 0 10*3/uL (ref 0.0–0.1)
Basophils Relative: 1 %
Eosinophils Absolute: 0.1 10*3/uL (ref 0.0–0.5)
Eosinophils Relative: 2 %
HCT: 35.4 % — ABNORMAL LOW (ref 39.0–52.0)
Hemoglobin: 12.2 g/dL — ABNORMAL LOW (ref 13.0–17.0)
Immature Granulocytes: 0 %
Lymphocytes Relative: 33 %
Lymphs Abs: 1.3 10*3/uL (ref 0.7–4.0)
MCH: 33.7 pg (ref 26.0–34.0)
MCHC: 34.5 g/dL (ref 30.0–36.0)
MCV: 97.8 fL (ref 80.0–100.0)
Monocytes Absolute: 0.3 10*3/uL (ref 0.1–1.0)
Monocytes Relative: 8 %
Neutro Abs: 2.2 10*3/uL (ref 1.7–7.7)
Neutrophils Relative %: 56 %
Platelets: 86 10*3/uL — ABNORMAL LOW (ref 150–400)
RBC: 3.62 MIL/uL — ABNORMAL LOW (ref 4.22–5.81)
RDW: 13.8 % (ref 11.5–15.5)
WBC: 4 10*3/uL (ref 4.0–10.5)
nRBC: 0 % (ref 0.0–0.2)

## 2023-02-14 LAB — BASIC METABOLIC PANEL
Anion gap: 5 (ref 5–15)
BUN: 13 mg/dL (ref 6–20)
CO2: 19 mmol/L — ABNORMAL LOW (ref 22–32)
Calcium: 8.3 mg/dL — ABNORMAL LOW (ref 8.9–10.3)
Chloride: 108 mmol/L (ref 98–111)
Creatinine, Ser: 1.28 mg/dL — ABNORMAL HIGH (ref 0.61–1.24)
GFR, Estimated: >60 mL/min (ref >60)
Glucose, Bld: 95 mg/dL (ref 70–99)
Potassium: 4 mmol/L (ref 3.5–5.1)
Sodium: 132 mmol/L — ABNORMAL LOW (ref 135–145)

## 2023-02-14 MED ORDER — CETAPHIL MOISTURIZING EX LOTN
TOPICAL_LOTION | Freq: Two times a day (BID) | CUTANEOUS | Status: DC
  Filled 2023-02-14: qty 473

## 2023-02-14 MED ORDER — UREA 10 % EX LOTN
TOPICAL_LOTION | Freq: Every day | CUTANEOUS | Status: DC
  Filled 2023-02-14: qty 237
  Filled 2023-02-14: qty 1

## 2023-02-14 NOTE — Plan of Care (Signed)

## 2023-02-14 NOTE — Consult Note (Addendum)
[facility] Psychiatric Consult Follow-up  Patient Name: .Russell [name]  MRN: [id number]  DOB: 1965/09/08  Consult Order details:  Orders (From admission, onward)     Start     Ordered   02/11/23 1440  IP CONSULT TO PSYCHIATRY       Comments: Patient has acute encephalopathy, recent diagnosed HIV. Had recent strep pneumonia and bacteremia. We have IVC'ed him because he does not have capacity, agitated, would like assistance in treating his encephalopathy.  Ordering Provider: Manuela Neptune, MD  Provider:  (Not yet assigned)  Question Answer Comment  Location [facility]   Reason for Consult? agitation      02/11/23 1441   02/05/23 1517  IP CONSULT TO PSYCHIATRY       Comments: Admitted for sepsis due to S. Pneumoniae bacteremia, also HIV positive, positive RPR. Waxing and waning mental status, at times seems to be having visual hallucinations. Infection is improving but mental status is still poor. MRI shows advanced cerebral atrophy for age.  Ordering Provider: Marrianne Mood, MD  Provider:  (Not yet assigned)  Question Answer Comment  Location [facility]   Reason for Consult? Delirium versus underlying psychotic disorder      02/05/23 1519             Mode of Visit: In person    Psychiatry Consult Evaluation  Service Date: February 14, 2023 LOS:  LOS: 11 days  Chief Complaint   Primary Psychiatric Diagnoses  Acute agitation   Assessment  Russell [name] is a 58 y.o. male admitted for septic encephalopathy 02/03/2023  9:55 AM. He carries no formal psychiatric illness and has a past medical history of HIV dementia, cerebral atrophy, AIDS, Pulmonary HTN, HTN, severely reduced RV funx, thrombocytopenia and Hepatitis C.   On initial examination, patient is calmly laying in bed and cooperative during the interview. The Patient alert and oriented to location and person. Disoriented to month and year. Reports can be irritable and  anxious at times, but can't recall trying to leave AMA yesterday. Patient received agitation medications yesterday, was redirected to the room and refused medications overnight per chart review.   Per nursing staff, patient continued to be agitated overnight, unable to be redirected, getting OOB and wanting to go outside. Refused scheduled agitation meds and received IV Zyprexa prns x2  On morning assessment, Patient oriented to person and location. He is calm, with covers over his head and able to participate during interview. Compliant with morning Zyprexa dose. Patient appears to get more agitated during afternoon and evening hours. Will continue scheduled zyprexa doses to target symptoms prior to onset.     02/14/23: Patient seen in his hospital room. He was sleeping when this Clinical research associate entered his room. Patient was able to wake up but barely participate in conversation. He denies SI/HI and denies any aggressive or irritable behavior. No report of aggressive behavior in the last 24 hours.    Please see plan below for detailed recommendations.   Diagnoses:  Active Hospital problems: Principal Problem:   Pneumococcal bacteremia Active Problems:   Sepsis (HCC)   Fall   Encephalopathy   Community acquired pneumonia   Thrombocytopenia (HCC)   Hematuria   Homelessness   Alcoholism (HCC)   AIDS (HCC)   Positive RPR test   Cholelithiasis without cholecystitis   Aortic atherosclerosis (HCC)   Cerebral atrophy (HCC)   Horseshoe kidney   Malnutrition of moderate degree   HIV dementia (HCC)  HCV (hepatitis C virus)   Pulmonary hypertension (HCC)   Right ventricular systolic dysfunction   Biological false positive RPR test   Onychomycosis    Plan  ## Psychiatric Medication Recommendations:  - Continue with Zyprexa 5 mg qAM and 5 mg qPM and 10 mg at bedtime, to intermittent agitation, appears symptoms get worse in the evening  -- IV Zyprexa 5 mg BID prn, use only if patient refuses PO  agitation meds  -- Discussed Zyprexa with sister, discussed risks, side effects, black box warning and benefits and she was amenable with medication recommendations.  -- Recommend disclosing prognosis and illness with family members if patient consents, appear to be supportive based on patient report and reviewed notes   -- Consider Palliative Consult    ## Medical Decision Making Capacity: Not specifically addressed in this encounter  ## Further Work-up:  While pt on Qtc prolonging medications, please monitor & replete K+ to 4 and Mg2+ to 2 -- most recent EKG on 1/8 had QtC of 454 -- Pertinent labwork reviewed earlier this admission includes:  Admission labs: NA 131,  Cr. 1.45, AST 85, Calcium 8.2, Bcx: Strep Pneumo, UDS negative, HIV reactive, CD 4 % 11.84, CD4 absolute 116 RPR reactive, T Pallium non reactive   LDL 79,   ## Disposition:-- There are no psychiatric contraindications to discharge at this time  ## Behavioral / Environmental: -Delirium Precautions: Delirium Interventions for Nursing and Staff: - RN to open blinds every AM. - To Bedside: Glasses, hearing aide, and pt's own shoes. Make available to patients. when possible and encourage use. - Encourage po fluids when appropriate, keep fluids within reach. - OOB to chair with meals. - Passive ROM exercises to all extremities with AM & PM care. - RN to assess orientation to person, time and place QAM and PRN. - Recommend extended visitation hours with familiar family/friends as feasible. - Staff to minimize disturbances at night. Turn off television when pt asleep or when not in use. or Utilize compassion and acknowledge the patient's experiences while setting clear and realistic expectations for care.   ## Safety and Observation Level:  - Based on my clinical evaluation, I estimate the patient to be at low risk of self harm in the current setting. - At this time, we recommend  1:1 Observation. This decision is based on my review of  the chart including patient's history and current presentation, interview of the patient, mental status examination, and consideration of suicide risk including evaluating suicidal ideation, plan, intent, suicidal or self-harm behaviors, risk factors, and protective factors. This judgment is based on our ability to directly address suicide risk, implement suicide prevention strategies, and develop a safety plan while the patient is in the clinical setting. Please contact our team if there is a concern that risk level has changed.  CSSR Risk Category:C-SSRS RISK CATEGORY: No Risk  Suicide Risk Assessment: Patient has following modifiable risk factors for suicide: lack of access to outpatient mental health resources, which we are addressing by providing outpatient resources in follow-up provider information. Patient has following non-modifiable or demographic risk factors for suicide: male gender Patient has the following protective factors against suicide: Supportive family, Supportive friends, Cultural, spiritual, or religious beliefs that discourage suicide, and no history of suicide attempts  Thank you for this consult request. Recommendations have been communicated to the primary team.  We will continue to follow at this time.   Fredonia Highland, MD       History of  Present Illness  Relevant Aspects of Hospital Admitted on 02/03/2023 for acute septic encephalopathy  Patient Report:  Collected from the patient at bedside   On morning assessment, patient is calmly laying in bed, covers over head and cooperative during the interview. The Patient alert and oriented to location and person. The patient denies any depressive symptoms, difficulty with sleep or appetite. He denies SI and HI. Reports he refuses evening medications because they make him sleepy.   Psych ROS:  Depression: Denies Anxiety: Denies Mania (lifetime and current): Denies Psychosis: (lifetime and current): Denies    Collateral information:  Miko, Lovos, [phone] (contacted on 1/16)   Last saw her brother on the 1/10. Marthe Patch he is improving and hasn't been able to visit him due to her current illness. Discussed increased agitation and patient wanting to leave during the prior and current evening shifts. Discussed starting zyprexa to help with symptomatic control. Discussed risks, benefits, side effects and black box warning. Sister amenable with zyprexa administration " Do whatever yall need to do" to keep him the hospital and safe.   Review of Systems  Psychiatric/Behavioral:  Negative for depression, hallucinations, memory loss, substance abuse and suicidal ideas. The patient is not nervous/anxious and does not have insomnia.      Psychiatric and Social History  Psychiatric History:  Information collected from patient   Prev Dx/Sx: Denies  Current Psych Provider: Denies  Home Meds (current): Denies  Previous Med Trials: Denies  Therapy: Remote history, none currently   Prior Psych Hospitalization: Denies   Prior Self Harm: Denies  Prior Violence: Denies   Family Psych History: Denies  Family Hx suicide: Denies   Social History:  Developmental Hx: Denies  Educational Hx: 11th grade Occupational Hx: Unemployed, side jobs  Armed forces operational officer Hx: Denies  Living Situation: Family members, currently with cousin or sister  Spiritual Hx: Yes  Access to weapons/lethal means: Denies    Substance History Alcohol: Remote   Type of alcohol liquor and beer  Last Drink Unsure  Number of drinks per day Denies recent use  History of alcohol withdrawal seizures Denies  History of DT's Denies  Tobacco: Denies (remote use 2012 1 PPD per EMR)  Illicit drugs: Denies current use, ( Cocaine use in remote past per EMR)  Prescription drug abuse: Denies  Rehab hx: Denies   Exam Findings  Physical Exam:  Vital Signs:  Temp:  [97.9 F (36.6 C)-98.3 F (36.8 C)] 97.9 F (36.6 C) (01/18 0749) Pulse  Rate:  [90-99] 94 (01/18 0749) Resp:  [16-18] 16 (01/18 0749) BP: (107-127)/(74-75) 107/74 (01/18 0749) SpO2:  [97 %-100 %] 100 % (01/18 0749) Blood pressure 107/74, pulse 94, temperature 97.9 F (36.6 C), temperature source Oral, resp. rate 16, height 5\' 6"  (1.676 m), weight 57.8 kg, SpO2 100%. Body mass index is 20.57 kg/m.  Physical Exam Constitutional:      Appearance: He is ill-appearing.  Pulmonary:     Effort: Pulmonary effort is normal.  Neurological:     Mental Status: He is disoriented.     Comments: Refer to mental status exam   Psychiatric:        Mood and Affect: Mood is not anxious or depressed.        Behavior: Behavior is not agitated, slowed, aggressive or withdrawn. Behavior is cooperative.        Thought Content: Thought content is not paranoid. Thought content does not include homicidal or suicidal ideation. Thought content does not include homicidal or suicidal  plan.     Mental Status Exam: General Appearance: Disheveled hair, hospital gown, appears older than stated age, laying in bed   Orientation:  Other:  person and location  missed year, month and day   Memory:  Immediate;   Poor  Concentration:  Concentration: Fair  Recall:  Poor  Attention  Fair  Eye Contact:  Poor  Speech:  Normal Rate  Language:  Fair  Volume:  Normal  Mood: "fine"   Affect:  Congruent  Thought Process:  Coherent  Thought Content:  Logical  Suicidal Thoughts:  No  Homicidal Thoughts:  No  Judgement:  Poor  Insight:  Lacking  Psychomotor Activity:  Normal  Akathisia:  No  Fund of Knowledge:  NA      Assets:  Housing Social Support  Cognition:  Impaired, HIV Dementia and cerebral atrophy   ADL's:  Intact  AIMS (if indicated):        Other History   These have been pulled in through the EMR, reviewed, and updated if appropriate.  Family History:  The patient's family history is not on file.  Medical History: Past Medical History:  Diagnosis Date   Aortic  atherosclerosis (HCC) 02/04/2023   Cerebral atrophy (HCC) 02/04/2023   Cholelithiasis without cholecystitis 02/04/2023   Closed dislocation of right ankle 06/03/2018   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   Pneumococcal bacteremia 02/04/2023    Surgical History: Past Surgical History:  Procedure Laterality Date   ORIF ANKLE FRACTURE Right 06/03/2018   Procedure: OPEN REDUCTION INTERNAL FIXATION (ORIF) TRIMALLEOLAR ANKLE FRACTURE WITH SYNDESMOSIS;  Surgeon: Teryl Lucy, MD;  Location: [facility];  Service: Orthopedics;  Laterality: Right;     Medications:   Current Facility-Administered Medications:    amLODipine (NORVASC) tablet 5 mg, 5 mg, Oral, Daily, Bender, Emily, DO, 5 mg at 02/14/23 1037   bictegravir-emtricitabine-tenofovir AF (BIKTARVY) 50-200-25 MG per tablet 1 tablet, 1 tablet, Oral, Daily, Daiva Eves, Lisette Grinder, MD, 1 tablet at 02/14/23 1037   cetaphil lotion, , Topical, BID, Alexander-Savino, Washington, MD, Given at 02/14/23 1153   feeding supplement (ENSURE ENLIVE / ENSURE PLUS) liquid 237 mL, 237 mL, Oral, BID BM, Hoffman, Erik C, DO, 237 mL at 02/14/23 1040   folic acid (FOLVITE) tablet 1 mg, 1 mg, Oral, Daily, Marrianne Mood, MD, 1 mg at 02/14/23 1037   multivitamin with minerals tablet 1 tablet, 1 tablet, Oral, Daily, Marrianne Mood, MD, 1 tablet at 02/13/23 2126   OLANZapine (ZYPREXA) injection 5 mg, 5 mg, Intramuscular, BID PRN, Peterson Ao, MD, 5 mg at 02/12/23 2302   OLANZapine zydis (ZYPREXA) disintegrating tablet 10 mg, 10 mg, Oral, QHS, Peterson Ao, MD, 10 mg at 02/13/23 2126   OLANZapine zydis (ZYPREXA) disintegrating tablet 5 mg, 5 mg, Oral, BID, Peterson Ao, MD, 5 mg at 02/14/23 0845   rosuvastatin (CRESTOR) tablet 5 mg, 5 mg, Oral, Daily, Bender, Emily, DO, 5 mg at 02/14/23 1037   senna (SENOKOT) tablet 8.6 mg, 1 tablet, Oral, Daily, Alexander-Savino, Washington, MD, 8.6 mg at 02/14/23 1037   sodium chloride flush (NS) 0.9 % injection 3  mL, 3 mL, Intravenous, Q12H, Marrianne Mood, MD, 3 mL at 02/14/23 1040   sodium chloride flush (NS) 0.9 % injection 3 mL, 3 mL, Intravenous, PRN, Marrianne Mood, MD   sulfamethoxazole-trimethoprim (BACTRIM DS) 800-160 MG per tablet 1 tablet, 1 tablet, Oral, Daily, Bender, Emily, DO, 1 tablet at 02/14/23 1037   thiamine (VITAMIN B1) tablet 100 mg, 100 mg, Oral, Daily, 100 mg  at 02/14/23 1037 **OR** thiamine (VITAMIN B1) injection 100 mg, 100 mg, Intravenous, Daily, Marrianne Mood, MD, 100 mg at 02/09/23 0941   urea 10 % lotion, , Topical, Daily, Alexander-Savino, Washington, MD  Allergies: No Known Allergies  Fredonia Highland, MD

## 2023-02-14 NOTE — Progress Notes (Signed)
Summary: Russell [name] is a 58 y.o. male with remote history of cocaine use disorder and housing instability presenting with deteriorating mental status and admitted for septic encephalopathy.  Subjective:  He is feeling tired. Drinking water, has not had much appetite today.   Objective:  Vital signs in last 24 hours: Vitals:   02/13/23 0907 02/13/23 2033 02/14/23 0459 02/14/23 0749  BP: (!) 152/110 122/75 127/75 107/74  Pulse: 97 99 90 94  Resp: 19 18 18 16   Temp: 97.9 F (36.6 C) 98.3 F (36.8 C) 97.9 F (36.6 C) 97.9 F (36.6 C)  TempSrc: Axillary Oral Oral Oral  SpO2: 100% 98% 97% 100%  Weight:      Height:       Physical Exam General: NAD, tired-appearing, cooperative Cardiovascular: RRR, no murmurs, rubs or gallops, no bilateral pedal edema Respiratory: normal work of breathing, clear to auscultation bilaterally Extremities: able to move all four extremities, icthyosis of the right extremity Neurological: alert, oriented to person but not date or place.   Weight change:   Intake/Output Summary (Last 24 hours) at 02/14/2023 0951 Last data filed at 02/14/2023 0800 Gross per 24 hour  Intake 480 ml  Output --  Net 480 ml   Assessment/Plan:  Principal Problem:   Pneumococcal bacteremia Active Problems:   Sepsis (HCC)   Fall   Encephalopathy   Community acquired pneumonia   Thrombocytopenia (HCC)   Hematuria   Homelessness   Alcoholism (HCC)   AIDS (HCC)   Positive RPR test   Cholelithiasis without cholecystitis   Aortic atherosclerosis (HCC)   Cerebral atrophy (HCC)   Horseshoe kidney   Malnutrition of moderate degree   HIV dementia (HCC)   HCV (hepatitis C virus)   Pulmonary hypertension (HCC)   Right ventricular systolic dysfunction   Biological false positive RPR test   Onychomycosis  Russell [name] is a 58 y.o. male with remote history of cocaine use disorder and housing instability presenting with deteriorating mental status and admitted  for septic encephalopathy.   HIV Dementia  AIDS Cerebral Atrophy  Delirium Patient has not been agitated, has been redirectable since starting Zyprexa 5mg  BID and 10mg  at bedtime. Delirium precautions still in place. Still has poor insight into as to why he is in the hospital. Does not have capacity to make his own decisions. Spoke with sister, she works all day and would not be able to supervise him at home. Will reach out to Psych to ask thoughts about IVC, since he has been cooperative and redirectable with the Zyprexa.   -cw Zyprexa 5 mg BID and 10 mg at bedtime  - cw Biktarvy, DS Bactrim daily for PJP prophylaxis  - cw Crestor 5 mg - Delirium precautions - PT  AKI- resolving May have been related to decreased PO intake while he has agitated. Baseline seems to be around 0.9-1.1 Cr bumped from 1.1 to 1.54 yesterday. We have been encouraging PO intake and Cr is trending back down at 1.28.  -Continue encouraging PO intake  -Monitor BMP  HCV  Patient positive for Hep C with viral load of 19,300,000. RUQ U/S revealed possible early cirrhosis, no suspicious hepatic lesion, cholelithiasis without sonographic findings to suggest acute cholecystitis and an 1.8 cm polypoid lesion in the gallbladder fundus that was poorly evaluated on prior CT due to motion degradation. - Will need follow-up MRI abdomen with/without contrast ~02/23/2023 - Recommend outpatient treatment   Acute encephalopathy  Severe sepsis 2/2 pneumococcal bacteremia CAP  These have resolved. Blood cultures have NGTD. Remains Afebrile. His worsening mentation at admission may have been related to his infection, which has since resolved.  -CTM fever curve -CBC   HTN Normotensive overnight.  - Continue to monitor BP - Continue amlodipine 5 mg for chronic BP control     Thrombocytopenia Platelets 86 yesterday. Combination of HIV, Hep C, and early cirrhosis.  -CTM  Icthyosis  Of the right lower extremity.  -urea cream and  moisturizer    Severely reduced right ventricular systolic function Pulmonary Artery HTN Cardiology consulted, appreciate recommendations. They do not think he is a candidate for [facility] catheterization or aggressive cardiac therapies. Cardiology does not recommend cardiology follow-up at this time.    VTE Prophylaxis: SCDs Diet: Regular  IVF: N/A Anticipated Discharge Location: SNF or home    Dispo: Anticipated discharge in approximately 1-2 days.   LOS: 11 days   Russell Neptune, MD 02/14/2023, 9:51 AM

## 2023-02-15 DIAGNOSIS — B953 Streptococcus pneumoniae as the cause of diseases classified elsewhere: Secondary | ICD-10-CM | POA: Diagnosis not present

## 2023-02-15 DIAGNOSIS — B1920 Unspecified viral hepatitis C without hepatic coma: Secondary | ICD-10-CM | POA: Diagnosis not present

## 2023-02-15 DIAGNOSIS — Q809 Congenital ichthyosis, unspecified: Secondary | ICD-10-CM

## 2023-02-15 DIAGNOSIS — E871 Hypo-osmolality and hyponatremia: Secondary | ICD-10-CM

## 2023-02-15 DIAGNOSIS — R7881 Bacteremia: Secondary | ICD-10-CM | POA: Diagnosis not present

## 2023-02-15 LAB — CBC WITH DIFFERENTIAL/PLATELET
Abs Immature Granulocytes: 0.01 10*3/uL (ref 0.00–0.07)
Basophils Absolute: 0 10*3/uL (ref 0.0–0.1)
Basophils Relative: 1 %
Eosinophils Absolute: 0.1 10*3/uL (ref 0.0–0.5)
Eosinophils Relative: 2 %
HCT: 34.6 % — ABNORMAL LOW (ref 39.0–52.0)
Hemoglobin: 12 g/dL — ABNORMAL LOW (ref 13.0–17.0)
Immature Granulocytes: 0 %
Lymphocytes Relative: 34 %
Lymphs Abs: 1.3 10*3/uL (ref 0.7–4.0)
MCH: 34.1 pg — ABNORMAL HIGH (ref 26.0–34.0)
MCHC: 34.7 g/dL (ref 30.0–36.0)
MCV: 98.3 fL (ref 80.0–100.0)
Monocytes Absolute: 0.4 10*3/uL (ref 0.1–1.0)
Monocytes Relative: 9 %
Neutro Abs: 2 10*3/uL (ref 1.7–7.7)
Neutrophils Relative %: 54 %
Platelets: 88 10*3/uL — ABNORMAL LOW (ref 150–400)
RBC: 3.52 MIL/uL — ABNORMAL LOW (ref 4.22–5.81)
RDW: 13.7 % (ref 11.5–15.5)
WBC: 3.8 10*3/uL — ABNORMAL LOW (ref 4.0–10.5)
nRBC: 0 % (ref 0.0–0.2)

## 2023-02-15 LAB — BASIC METABOLIC PANEL
Anion gap: 4 — ABNORMAL LOW (ref 5–15)
BUN: 12 mg/dL (ref 6–20)
CO2: 20 mmol/L — ABNORMAL LOW (ref 22–32)
Calcium: 8.2 mg/dL — ABNORMAL LOW (ref 8.9–10.3)
Chloride: 105 mmol/L (ref 98–111)
Creatinine, Ser: 1.15 mg/dL (ref 0.61–1.24)
GFR, Estimated: >60 mL/min (ref >60)
Glucose, Bld: 91 mg/dL (ref 70–99)
Potassium: 3.8 mmol/L (ref 3.5–5.1)
Sodium: 129 mmol/L — ABNORMAL LOW (ref 135–145)

## 2023-02-15 LAB — HIV GENOSURE(R) MG

## 2023-02-15 LAB — HIV-1 RNA, PCR (GRAPH) RFX/GENO EDI
HIV-1 RNA BY PCR: 349000 {copies}/mL
HIV-1 RNA Quant, Log: 5.543 {Log}

## 2023-02-15 LAB — REFLEX TO GENOSURE(R) MG EDI: HIV GenoSure(R): 1

## 2023-02-15 MED ORDER — RIVAROXABAN 10 MG PO TABS
10.0000 mg | ORAL_TABLET | Freq: Every day | ORAL | Status: DC
  Administered 2023-02-15 – 2023-04-03 (×48): 10 mg via ORAL
  Filled 2023-02-15 (×48): qty 1

## 2023-02-15 NOTE — Plan of Care (Signed)

## 2023-02-15 NOTE — Progress Notes (Signed)
Overview: Russell [name] is a 58 y.o. male with remote history of cocaine use disorder and housing instability presenting with deteriorating mental status and admitted for septic encephalopathy.   Overnight:NAEON  Subjective: no acute complaints. States doing well. No acute complaints.   Objective:  Vital signs in last 24 hours: Vitals:   02/14/23 0459 02/14/23 0749 02/14/23 1627 02/15/23 0335  BP: 127/75 107/74 (!) 130/92 (!) 116/102  Pulse: 90 94 95 (!) 103  Resp: 18 16 16 18   Temp: 97.9 F (36.6 C) 97.9 F (36.6 C) 98.2 F (36.8 C) 98.6 F (37 C)  TempSrc: Oral Oral Oral Oral  SpO2: 97% 100% 100% 98%  Weight:      Height:       Supplemental O2: Room Air Last BM Date : 02/14/23 SpO2: 98 % Filed Weights   02/03/23 1800  Weight: 57.8 kg    Intake/Output Summary (Last 24 hours) at 02/15/2023 0715 Last data filed at 02/14/2023 2127 Gross per 24 hour  Intake 1323 ml  Output --  Net 1323 ml   Net IO Since Admission: 30,834.41 mL [02/15/23 0715]  Physical Exam General: NAD HENT: NCAT Lungs:  breathing comfortably on room air Cardiovascular: RRR,well perfused MSK: No asymmetry Neuro: alert and oriented x1 Psych: normal mood  Diagnostics    Latest Ref Rng & Units 02/14/2023    6:36 AM 02/12/2023    8:05 PM 02/11/2023    6:34 AM  CBC  WBC 4.0 - 10.5 K/uL 4.0  3.4  3.2   Hemoglobin 13.0 - 17.0 g/dL 11.9  14.7  82.9   Hematocrit 39.0 - 52.0 % 35.4  35.6  38.0   Platelets 150 - 400 K/uL 86  73  69       Latest Ref Rng & Units 02/14/2023    6:36 AM 02/12/2023    5:05 PM 02/11/2023    6:34 AM  BMP  Glucose 70 - 99 mg/dL 95  94  95   BUN 6 - 20 mg/dL 13  12  12    Creatinine 0.61 - 1.24 mg/dL 5.62  1.30  8.65   Sodium 135 - 145 mmol/L 132  129  132   Potassium 3.5 - 5.1 mmol/L 4.0  4.0  3.8   Chloride 98 - 111 mmol/L 108  105  106   CO2 22 - 32 mmol/L 19  17  22    Calcium 8.9 - 10.3 mg/dL 8.3  7.9  8.1     Assessment/Plan: Principal Problem:   Pneumococcal  bacteremia Active Problems:   Sepsis (HCC)   Fall   Encephalopathy   Community acquired pneumonia   Thrombocytopenia (HCC)   Hematuria   Homelessness   Alcoholism (HCC)   AIDS (HCC)   Positive RPR test   Cholelithiasis without cholecystitis   Aortic atherosclerosis (HCC)   Cerebral atrophy (HCC)   Horseshoe kidney   Malnutrition of moderate degree   HIV dementia (HCC)   HCV (hepatitis C virus)   Pulmonary hypertension (HCC)   Right ventricular systolic dysfunction   Biological false positive RPR test   Onychomycosis   Agitation  HIV c/b AIDS and HIV Dementia Pt with waxing and waning mental status with concern for delirium superimposed on HIV dementia. No agitation since starting zyprexa. Will continue current dose of 5 mg BID and 10 mg at bedtime. Will continue Biktarvy for his HIV and bactrim for PJP prophylaxis. Delirium precautions. Discussed with psych and they agree that IVC is no  longer needed.   HTN: well controlled on amlodipine 5 mg every day.   HCV: Pt with positive HCV with elevated viral load and imaging showing early cirrhosis. OP follow up for this.   Thrombocytopenia: stable. 88 today.   Icthyosis: chronic. Continue urea cream and moisturizer  Hyponatremia: Mild and chronic. CTM.   Severely reduced right ventricular systolic fxn Pulmonary HTN Per cardiology, pt is a poor cadidate for RH catha and advanced cardiac therapies.    Resolved problems: AKI-resolved.  Severe Sepsis 2/2 to strep bacteremia resolved.   Diet: Normal IVF: None VTE: Xarelto Code: Full PT/OT recs: None, none. Prior to Admission Living Arrangement: Home Anticipated Discharge Location: Home Barriers to Discharge: Medical Stability Dispo: Anticipated discharge in approximately 1 day(s).   Gwenevere Abbot, MD Eligha Bridegroom. [facility] Internal Medicine Residency, PGY-3  Pager: [phone] After 5 pm and on weekends: Please call the on-call pager

## 2023-02-15 NOTE — Consult Note (Signed)
[facility] Psychiatric Consult Follow-up  Patient Name: .Russell [name]  MRN: [id number]  DOB: 1965/12/16  Consult Order details:  Orders (From admission, onward)     Start     Ordered   02/11/23 1440  IP CONSULT TO PSYCHIATRY       Comments: Patient has acute encephalopathy, recent diagnosed HIV. Had recent strep pneumonia and bacteremia. We have IVC'ed him because he does not have capacity, agitated, would like assistance in treating his encephalopathy.  Ordering Provider: Manuela Neptune, MD  Provider:  (Not yet assigned)  Question Answer Comment  Location [facility]   Reason for Consult? agitation      02/11/23 1441   02/05/23 1517  IP CONSULT TO PSYCHIATRY       Comments: Admitted for sepsis due to S. Pneumoniae bacteremia, also HIV positive, positive RPR. Waxing and waning mental status, at times seems to be having visual hallucinations. Infection is improving but mental status is still poor. MRI shows advanced cerebral atrophy for age.  Ordering Provider: Marrianne Mood, MD  Provider:  (Not yet assigned)  Question Answer Comment  Location [facility]   Reason for Consult? Delirium versus underlying psychotic disorder      02/05/23 1519             Mode of Visit: In person    Psychiatry Consult Evaluation  Service Date: February 15, 2023 LOS:  LOS: 12 days  Chief Complaint   Primary Psychiatric Diagnoses  Acute agitation   Assessment  Mannix A [name] is a 58 y.o. male admitted for septic encephalopathy 02/03/2023  9:55 AM. He carries no formal psychiatric illness and has a past medical history of HIV dementia, cerebral atrophy, AIDS, Pulmonary HTN, HTN, severely reduced RV funx, thrombocytopenia and Hepatitis C.   On initial examination, patient is calmly laying in bed and cooperative during the interview. The Patient alert and oriented to location and person. Disoriented to month and year. Reports can be irritable and  anxious at times, but can't recall trying to leave AMA yesterday. Patient received agitation medications yesterday, was redirected to the room and refused medications overnight per chart review.   Per nursing staff, patient continued to be agitated overnight, unable to be redirected, getting OOB and wanting to go outside. Refused scheduled agitation meds and received IV Zyprexa prns x2  On morning assessment, Patient oriented to person and location. He is calm, with covers over his head and able to participate during interview. Compliant with morning Zyprexa dose. Patient appears to get more agitated during afternoon and evening hours. Will continue scheduled zyprexa doses to target symptoms prior to onset.     02/14/23: Patient seen in his hospital room. He was sleeping when this Clinical research associate entered his room. Patient was able to wake up but barely participate in conversation. He denies SI/HI and denies any aggressive or irritable behavior. No report of aggressive behavior in the last 24 hours.   02/15/23: Patient seen face to face in his hospital room. He is awake and alert, but oriented only to place. Patient remains confused and disoriented to time, person and situation. Patient indicates he slept good over night and denies depressed mood, anxiety, apprehension, irritability, restlessness or excessive nervousness. Additionally, patient denies delusions, hallucinations, perceptual abnormalities, suicidal or homicidal ideation, intent or plan. Collateral information from the 1:1 sitter indicates patient has been calm, with no agitated or aggressive behavior. She indicates patient is now able to use the bathroom with no  assistance, which he could not do before.    Please see plan below for detailed recommendations.   Diagnoses:  Active Hospital problems: Principal Problem:   Pneumococcal bacteremia Active Problems:   Sepsis (HCC)   Fall   Encephalopathy   Community acquired pneumonia    Thrombocytopenia (HCC)   Hematuria   Homelessness   Alcoholism (HCC)   AIDS (HCC)   Positive RPR test   Cholelithiasis without cholecystitis   Aortic atherosclerosis (HCC)   Cerebral atrophy (HCC)   Horseshoe kidney   Malnutrition of moderate degree   HIV dementia (HCC)   HCV (hepatitis C virus)   Pulmonary hypertension (HCC)   Right ventricular systolic dysfunction   Biological false positive RPR test   Onychomycosis   Agitation    Plan  ## Psychiatric Medication Recommendations:  - Continue with Zyprexa 5 mg qAM and 5 mg qPM and 10 mg at bedtime, to intermittent agitation, appears symptoms get worse in the evening  -- IV Zyprexa 5 mg BID prn, use only if patient refuses PO agitation meds  -- Discussed Zyprexa with sister, discussed risks, side effects, black box warning and benefits and she was amenable with medication recommendations.  -- Recommend disclosing prognosis and illness with family members if patient consents, appear to be supportive based on patient report and reviewed notes   -- Consider Palliative Consult    ## Medical Decision Making Capacity: Not specifically addressed in this encounter  ## Further Work-up:  While pt on Qtc prolonging medications, please monitor & replete K+ to 4 and Mg2+ to 2 -- most recent EKG on 1/8 had QtC of 454 -- Pertinent labwork reviewed earlier this admission includes:  Admission labs: NA 131,  Cr. 1.45, AST 85, Calcium 8.2, Bcx: Strep Pneumo, UDS negative, HIV reactive, CD 4 % 11.84, CD4 absolute 116 RPR reactive, T Pallium non reactive   LDL 79,   ## Disposition:-- There are no psychiatric contraindications to discharge at this time  ## Behavioral / Environmental: -Delirium Precautions: Delirium Interventions for Nursing and Staff: - RN to open blinds every AM. - To Bedside: Glasses, hearing aide, and pt's own shoes. Make available to patients. when possible and encourage use. - Encourage po fluids when appropriate, keep fluids  within reach. - OOB to chair with meals. - Passive ROM exercises to all extremities with AM & PM care. - RN to assess orientation to person, time and place QAM and PRN. - Recommend extended visitation hours with familiar family/friends as feasible. - Staff to minimize disturbances at night. Turn off television when pt asleep or when not in use. or Utilize compassion and acknowledge the patient's experiences while setting clear and realistic expectations for care.   ## Safety and Observation Level:  - Based on my clinical evaluation, I estimate the patient to be at low risk of self harm in the current setting. - At this time, we recommend  1:1 Observation. This decision is based on my review of the chart including patient's history and current presentation, interview of the patient, mental status examination, and consideration of suicide risk including evaluating suicidal ideation, plan, intent, suicidal or self-harm behaviors, risk factors, and protective factors. This judgment is based on our ability to directly address suicide risk, implement suicide prevention strategies, and develop a safety plan while the patient is in the clinical setting. Please contact our team if there is a concern that risk level has changed.  CSSR Risk Category:C-SSRS RISK CATEGORY: No Risk  Suicide Risk  Assessment: Patient has following modifiable risk factors for suicide: lack of access to outpatient mental health resources, which we are addressing by providing outpatient resources in follow-up provider information. Patient has following non-modifiable or demographic risk factors for suicide: male gender Patient has the following protective factors against suicide: Supportive family, Supportive friends, Cultural, spiritual, or religious beliefs that discourage suicide, and no history of suicide attempts  Thank you for this consult request. Recommendations have been communicated to the primary team.  We will continue to  follow at this time.   Fredonia Highland, MD       History of Present Illness  Relevant Aspects of Hospital Admitted on 02/03/2023 for acute septic encephalopathy  Patient Report:  Collected from the patient at bedside   On morning assessment, patient is calmly laying in bed, covers over head and cooperative during the interview. The Patient alert and oriented to location and person. The patient denies any depressive symptoms, difficulty with sleep or appetite. He denies SI and HI. Reports he refuses evening medications because they make him sleepy.   Psych ROS:  Depression: Denies Anxiety: Denies Mania (lifetime and current): Denies Psychosis: (lifetime and current): Denies   Collateral information:  Cye, Einstein, [phone] (contacted on 1/16)   Last saw her brother on the 1/10. Marthe Patch he is improving and hasn't been able to visit him due to her current illness. Discussed increased agitation and patient wanting to leave during the prior and current evening shifts. Discussed starting zyprexa to help with symptomatic control. Discussed risks, benefits, side effects and black box warning. Sister amenable with zyprexa administration " Do whatever yall need to do" to keep him the hospital and safe.   Review of Systems  Psychiatric/Behavioral:  Negative for depression, hallucinations, memory loss, substance abuse and suicidal ideas. The patient is not nervous/anxious and does not have insomnia.      Psychiatric and Social History  Psychiatric History:  Information collected from patient   Prev Dx/Sx: Denies  Current Psych Provider: Denies  Home Meds (current): Denies  Previous Med Trials: Denies  Therapy: Remote history, none currently   Prior Psych Hospitalization: Denies   Prior Self Harm: Denies  Prior Violence: Denies   Family Psych History: Denies  Family Hx suicide: Denies   Social History:  Developmental Hx: Denies  Educational Hx: 11th grade Occupational Hx:  Unemployed, side jobs  Armed forces operational officer Hx: Denies  Living Situation: Family members, currently with cousin or sister  Spiritual Hx: Yes  Access to weapons/lethal means: Denies    Substance History Alcohol: Remote   Type of alcohol liquor and beer  Last Drink Unsure  Number of drinks per day Denies recent use  History of alcohol withdrawal seizures Denies  History of DT's Denies  Tobacco: Denies (remote use 2012 1 PPD per EMR)  Illicit drugs: Denies current use, ( Cocaine use in remote past per EMR)  Prescription drug abuse: Denies  Rehab hx: Denies   Exam Findings  Physical Exam:  Vital Signs:  Temp:  [97.8 F (36.6 C)-98.6 F (37 C)] 97.8 F (36.6 C) (01/19 0754) Pulse Rate:  [87-103] 87 (01/19 0754) Resp:  [16-18] 16 (01/19 0754) BP: (116-130)/(79-102) 123/79 (01/19 0754) SpO2:  [98 %-100 %] 99 % (01/19 0754) Blood pressure 123/79, pulse 87, temperature 97.8 F (36.6 C), resp. rate 16, height 5\' 6"  (1.676 m), weight 57.8 kg, SpO2 99%. Body mass index is 20.57 kg/m.  Physical Exam Constitutional:      Appearance: He is  ill-appearing.  Pulmonary:     Effort: Pulmonary effort is normal.  Neurological:     Mental Status: He is disoriented.     Comments: Refer to mental status exam   Psychiatric:        Mood and Affect: Mood is not anxious or depressed.        Behavior: Behavior is not agitated, slowed, aggressive or withdrawn. Behavior is cooperative.        Thought Content: Thought content is not paranoid. Thought content does not include homicidal or suicidal ideation. Thought content does not include homicidal or suicidal plan.     Mental Status Exam: General Appearance: Disheveled hair, hospital gown, appears older than stated age, laying in bed   Orientation:  Other:  person and location  missed year, month and day   Memory:  Immediate;   Poor  Concentration:  Concentration: Fair  Recall:  Poor  Attention  Fair  Eye Contact:  Poor  Speech:  Normal Rate  Language:   Fair  Volume:  Normal  Mood: "fine"   Affect:  Congruent  Thought Process:  Coherent  Thought Content:  Logical  Suicidal Thoughts:  No  Homicidal Thoughts:  No  Judgement:  Poor  Insight:  Lacking  Psychomotor Activity:  Normal  Akathisia:  No  Fund of Knowledge:  NA      Assets:  Housing Social Support  Cognition:  Impaired, HIV Dementia and cerebral atrophy   ADL's:  Intact  AIMS (if indicated):        Other History   These have been pulled in through the EMR, reviewed, and updated if appropriate.  Family History:  The patient's family history is not on file.  Medical History: 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   Pneumococcal bacteremia 02/04/2023    Surgical History: Past Surgical History:  Procedure Laterality Date   ORIF ANKLE FRACTURE Right 06/03/2018   Procedure: OPEN REDUCTION INTERNAL FIXATION (ORIF) TRIMALLEOLAR ANKLE FRACTURE WITH SYNDESMOSIS;  Surgeon: Teryl Lucy, MD;  Location: [facility];  Service: Orthopedics;  Laterality: Right;     Medications:   Current Facility-Administered Medications:    amLODipine (NORVASC) tablet 5 mg, 5 mg, Oral, Daily, Bender, Emily, DO, 5 mg at 02/15/23 0911   bictegravir-emtricitabine-tenofovir AF (BIKTARVY) 50-200-25 MG per tablet 1 tablet, 1 tablet, Oral, Daily, Daiva Eves, Lisette Grinder, MD, 1 tablet at 02/15/23 1610   cetaphil lotion, , Topical, BID, Alexander-Savino, [place], MD, Given at 02/15/23 0913   feeding supplement (ENSURE ENLIVE / ENSURE PLUS) liquid 237 mL, 237 mL, Oral, BID BM, Hoffman, Erik C, DO, 237 mL at 02/14/23 1511   folic acid (FOLVITE) tablet 1 mg, 1 mg, Oral, Daily, Marrianne Mood, MD, 1 mg at 02/15/23 9604   multivitamin with minerals tablet 1 tablet, 1 tablet, Oral,  Daily, Marrianne Mood, MD, 1 tablet at 02/14/23 2126   OLANZapine (ZYPREXA) injection 5 mg, 5 mg, Intramuscular, BID PRN, Peterson Ao, MD, 5 mg at 02/12/23 2302   OLANZapine zydis (ZYPREXA) disintegrating tablet 10 mg, 10 mg, Oral, QHS, Peterson Ao, MD, 10 mg at 02/14/23 2126   OLANZapine zydis (ZYPREXA) disintegrating tablet 5 mg, 5 mg, Oral, BID, Peterson Ao, MD, 5 mg at 02/15/23 5409   rivaroxaban (XARELTO) tablet 10 mg, 10 mg, Oral, Daily, Gwenevere Abbot, MD,  10 mg at 02/15/23 0912   rosuvastatin (CRESTOR) tablet 5 mg, 5 mg, Oral, Daily, Bender, Emily, DO, 5 mg at 02/15/23 0911   senna (SENOKOT) tablet 8.6 mg, 1 tablet, Oral, Daily, Alexander-Savino, [place], MD, 8.6 mg at 02/15/23 0912   sodium chloride flush (NS) 0.9 % injection 3 mL, 3 mL, Intravenous, Q12H, Marrianne Mood, MD, 3 mL at 02/15/23 0913   sodium chloride flush (NS) 0.9 % injection 3 mL, 3 mL, Intravenous, PRN, Marrianne Mood, MD   sulfamethoxazole-trimethoprim (BACTRIM DS) 800-160 MG per tablet 1 tablet, 1 tablet, Oral, Daily, Bender, Emily, DO, 1 tablet at 02/15/23 0911   thiamine (VITAMIN B1) tablet 100 mg, 100 mg, Oral, Daily, 100 mg at 02/15/23 0911 **OR** thiamine (VITAMIN B1) injection 100 mg, 100 mg, Intravenous, Daily, Marrianne Mood, MD, 100 mg at 02/09/23 0941   urea 10 % lotion, , Topical, Daily, Hassan Rowan, [place], MD, Given at 02/15/23 0913  Allergies: No Known Allergies  Kati Riggenbach Jenita Seashore, MD

## 2023-02-16 DIAGNOSIS — B20 Human immunodeficiency virus [HIV] disease: Principal | ICD-10-CM

## 2023-02-16 DIAGNOSIS — F0280 Dementia in other diseases classified elsewhere without behavioral disturbance: Secondary | ICD-10-CM | POA: Diagnosis not present

## 2023-02-16 LAB — CULTURE, BLOOD (ROUTINE X 2)
Culture: NO GROWTH
Culture: NO GROWTH

## 2023-02-16 LAB — BASIC METABOLIC PANEL
Anion gap: 7 (ref 5–15)
BUN: 17 mg/dL (ref 6–20)
CO2: 20 mmol/L — ABNORMAL LOW (ref 22–32)
Calcium: 8.4 mg/dL — ABNORMAL LOW (ref 8.9–10.3)
Chloride: 105 mmol/L (ref 98–111)
Creatinine, Ser: 1.31 mg/dL — ABNORMAL HIGH (ref 0.61–1.24)
GFR, Estimated: >60 mL/min (ref >60)
Glucose, Bld: 144 mg/dL — ABNORMAL HIGH (ref 70–99)
Potassium: 4 mmol/L (ref 3.5–5.1)
Sodium: 132 mmol/L — ABNORMAL LOW (ref 135–145)

## 2023-02-16 LAB — CBC
HCT: 36.1 % — ABNORMAL LOW (ref 39.0–52.0)
Hemoglobin: 12.5 g/dL — ABNORMAL LOW (ref 13.0–17.0)
MCH: 34.2 pg — ABNORMAL HIGH (ref 26.0–34.0)
MCHC: 34.6 g/dL (ref 30.0–36.0)
MCV: 98.6 fL (ref 80.0–100.0)
Platelets: 94 10*3/uL — ABNORMAL LOW (ref 150–400)
RBC: 3.66 MIL/uL — ABNORMAL LOW (ref 4.22–5.81)
RDW: 13.9 % (ref 11.5–15.5)
WBC: 3.5 10*3/uL — ABNORMAL LOW (ref 4.0–10.5)
nRBC: 0 % (ref 0.0–0.2)

## 2023-02-16 LAB — HIV GENOSURE REFLEX - HIVGTY - ELECTRONIC RECORD

## 2023-02-16 LAB — MAGNESIUM: Magnesium: 1.9 mg/dL (ref 1.7–2.4)

## 2023-02-16 LAB — GENOSURE INTEGRASE HIV EDI: HIV Genosure Integrase PDF 2: 1

## 2023-02-16 MED ORDER — LACTULOSE 10 GM/15ML PO SOLN
10.0000 g | Freq: Two times a day (BID) | ORAL | Status: DC
  Administered 2023-02-16 – 2023-02-23 (×15): 10 g via ORAL
  Filled 2023-02-16 (×15): qty 15

## 2023-02-16 MED ORDER — MAGNESIUM SULFATE 2 GM/50ML IV SOLN
2.0000 g | Freq: Once | INTRAVENOUS | Status: AC
  Administered 2023-02-16: 2 g via INTRAVENOUS
  Filled 2023-02-16: qty 50

## 2023-02-16 MED ORDER — OLANZAPINE 10 MG IM SOLR
5.0000 mg | Freq: Two times a day (BID) | INTRAMUSCULAR | Status: DC | PRN
  Administered 2023-02-18 – 2023-02-23 (×2): 5 mg via INTRAMUSCULAR
  Filled 2023-02-16 (×4): qty 10

## 2023-02-16 NOTE — Progress Notes (Signed)
Mobility Specialist Progress Note:    02/16/23 1043  Mobility  Activity Ambulated independently to bathroom  Level of Assistance Modified independent, requires aide device or extra time  Distance Ambulated (ft) 24 ft  Activity Response Tolerated well  Mobility Referral Yes  Mobility visit 1 Mobility  Mobility Specialist Start Time (ACUTE ONLY) 1035  Mobility Specialist Stop Time (ACUTE ONLY) 1040  Mobility Specialist Time Calculation (min) (ACUTE ONLY) 5 min   Pt received independently ambulating in room, using ModI to furniture walk for stability. Required no physical assistance. Declined ambulation in hallway. Pt returned to bed with all needs met.    Feliciana Rossetti Mobility Specialist Please contact via Special educational needs teacher or  Rehab office at [phone]

## 2023-02-16 NOTE — Consult Note (Cosign Needed Addendum)
[facility] Psychiatric Consult Follow-up  Patient Name: .Russell [name]  MRN: [id number]  DOB: [birth year]  Consult Order details:  Orders (From admission, onward)     Start     Ordered   02/11/23 1440  IP CONSULT TO PSYCHIATRY       Comments: Patient has acute encephalopathy, recent diagnosed HIV. Had recent strep pneumonia and bacteremia. We have IVC'ed him because he does not have capacity, agitated, would like assistance in treating his encephalopathy.  Ordering Provider: Manuela Neptune, MD  Provider:  (Not yet assigned)  Question Answer Comment  Location [facility]   Reason for Consult? agitation      02/11/23 1441   02/05/23 1517  IP CONSULT TO PSYCHIATRY       Comments: Admitted for sepsis due to S. Pneumoniae bacteremia, also HIV positive, positive RPR. Waxing and waning mental status, at times seems to be having visual hallucinations. Infection is improving but mental status is still poor. MRI shows advanced cerebral atrophy for age.  Ordering Provider: Marrianne Mood, MD  Provider:  (Not yet assigned)  Question Answer Comment  Location [facility]   Reason for Consult? Delirium versus underlying psychotic disorder      02/05/23 1519             Mode of Visit: In person    Psychiatry Consult Evaluation  Service Date: February 16, 2023 LOS:  LOS: 13 days  Chief Complaint   Primary Psychiatric Diagnoses  Acute agitation   Assessment  Russell [name] is a 58 y.o. male admitted for septic encephalopathy 02/03/2023  9:55 AM. He carries no formal psychiatric illness and has a past medical history of HIV dementia, cerebral atrophy, AIDS, Pulmonary HTN, HTN, severely reduced RV funx, thrombocytopenia and Hepatitis C.   On initial examination, patient was calmly laying in bed and cooperative during the interview. The Patient was alert and oriented to location and person. Disoriented to month and year. At that time, he was  irritable, anxious at times, and attempting to leave AMA. Psychiatry was consulted to assist with agitation symptom management.    02/16/23: Patient compliant with medications throughout the weekend and did not require prn agitation meds. Patients agitation is improved and we will sign off, please re-consult if needed.   Please see plan below for detailed recommendations.   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  -- Recommend disclosing prognosis and illness with family members if patient consents, appear  to be supportive based on patient report and reviewed notes   -- Consider Palliative Consult    ## Medical Decision Making Capacity: Not specifically addressed in this encounter  ## Further Work-up:  While pt on Qtc prolonging medications, please monitor & replete K+ to 4 and Mg2+ to 2 -- most recent EKG on 1/8 had QtC of 454 -- Pertinent labwork reviewed earlier this admission includes:  Admission labs: NA 131,  Cr. 1.45, AST  85, Calcium 8.2, Bcx: Strep Pneumo, UDS negative, HIV reactive, CD 4 % 11.84, CD4 absolute 116 RPR reactive, T Pallium non reactive   LDL 79,   ## Disposition:-- There are no psychiatric contraindications to discharge at this time  ## Behavioral / Environmental: -Delirium Precautions: Delirium Interventions for Nursing and Staff: - RN to open blinds every AM. - To Bedside: Glasses, hearing aide, and pt's own shoes. Make available to patients. when possible and encourage use. - Encourage po fluids when appropriate, keep fluids within reach. - OOB to chair with meals. - Passive ROM exercises to all extremities with AM & PM care. - RN to assess orientation to person, time and place QAM and PRN. - Recommend extended visitation hours with familiar family/friends as feasible. - Staff to minimize disturbances at night. Turn off television when pt asleep or when not in use. or Utilize compassion and acknowledge the patient's experiences while setting clear and realistic expectations for care.   ## Safety and Observation Level:  - Based on my clinical evaluation, I estimate the patient to be at low risk of self harm in the current setting. - At this time, we recommend  1:1 Observation. This decision is based on my review of the chart including patient's history and current presentation, interview of the patient, mental status examination, and consideration of suicide risk including evaluating suicidal ideation, plan, intent, suicidal or self-harm behaviors, risk factors, and protective factors. This judgment is based on our ability to directly address suicide risk, implement suicide prevention strategies, and develop a safety plan while the patient is in the clinical setting. Please contact our team if there is a concern that risk level has changed.  CSSR Risk Category:C-SSRS RISK CATEGORY: No Risk  Suicide Risk Assessment: Patient has following modifiable risk factors for suicide: lack of access to outpatient  mental health resources, which we are addressing by providing outpatient resources in follow-up provider information. Patient has following non-modifiable or demographic risk factors for suicide: male gender Patient has the following protective factors against suicide: Supportive family, Supportive friends, Cultural, spiritual, or religious beliefs that discourage suicide, and no history of suicide attempts  Thank you for this consult request. Recommendations have been communicated to the primary team.  We will sign off at this time.   Peterson Ao, MD       History of Present Illness  Relevant Aspects of Hospital Admitted on 02/03/2023 for acute septic encephalopathy  Patient Report:  Collected from the patient at bedside   On morning assessment,  Patient seen face to face in his hospital room. Patient was compliant with medications throughout the weekend and did not require prn agitation meds. No agitation events noted overnight by nursing staff. He is awake and alert, but oriented only to place and person.  Patient continues to deny depressed mood and anxiety. He denies issues with sleep and has a good appetite. Additionally, patient denies delusions, hallucinations, perceptual abnormalities, suicidal or homicidal ideation, intent or plan.   Psych ROS:  Depression: Denies Anxiety:  Denies Mania (lifetime and current): Denies Psychosis: (lifetime and current): Denies   Collateral information:  Shandon, Leys, [phone] (contacted on 1/16)   Last saw her brother on the 1/10. Marthe Patch he is improving and hasn't been able to visit him due to her current illness. Discussed increased agitation and patient wanting to leave during the prior and current evening shifts. Discussed starting zyprexa to help with symptomatic control. Discussed risks, benefits, side effects and black box warning. Sister amenable with zyprexa administration " Do whatever yall need to do" to keep him the hospital  and safe.   Review of Systems  Psychiatric/Behavioral:  Negative for depression, hallucinations, memory loss, substance abuse and suicidal ideas. The patient is not nervous/anxious and does not have insomnia.      Psychiatric and Social History  Psychiatric History:  Information collected from patient   Prev Dx/Sx: Denies  Current Psych Provider: Denies  Home Meds (current): Denies  Previous Med Trials: Denies  Therapy: Remote history, none currently   Prior Psych Hospitalization: Denies   Prior Self Harm: Denies  Prior Violence: Denies   Family Psych History: Denies  Family Hx suicide: Denies   Social History:  Developmental Hx: Denies  Educational Hx: 11th grade Occupational Hx: Unemployed, side jobs  Armed forces operational officer Hx: Denies  Living Situation: Family members, currently with cousin or sister  Spiritual Hx: Yes  Access to weapons/lethal means: Denies    Substance History Alcohol: Remote   Type of alcohol liquor and beer  Last Drink Unsure  Number of drinks per day Denies recent use  History of alcohol withdrawal seizures Denies  History of DT's Denies  Tobacco: Denies (remote use 2012 1 PPD per EMR)  Illicit drugs: Denies current use, ( Cocaine use in remote past per EMR)  Prescription drug abuse: Denies  Rehab hx: Denies   Exam Findings  Physical Exam:  Vital Signs:  Temp:  [98.3 F (36.8 C)] 98.3 F (36.8 C) (01/19 1600) Pulse Rate:  [107] 107 (01/19 1600) BP: (155)/(103) 155/103 (01/19 1600) SpO2:  [100 %] 100 % (01/19 1600) Blood pressure (!) 155/103, pulse (!) 107, temperature 98.3 F (36.8 C), temperature source Oral, resp. rate 16, height 5\' 6"  (1.676 m), weight 57.8 kg, SpO2 100%. Body mass index is 20.57 kg/m.  Physical Exam Constitutional:      Appearance: He is ill-appearing.  Pulmonary:     Effort: Pulmonary effort is normal.  Neurological:     Mental Status: He is disoriented.     Comments: Refer to mental status exam     Mental Status  Exam: General Appearance: Disheveled hair, hospital gown, appears older than stated age, laying in bed with covers over face   Orientation:  Other:  person and location  missed year, month and day   Memory:  Immediate;   Poor  Concentration:  Concentration: Fair  Recall:  Poor  Attention  Fair  Eye Contact:  Poor  Speech:  Normal Rate  Language:  Fair  Volume:  Normal  Mood: "good"   Affect:  Congruent  Thought Process:  Coherent  Thought Content:  Logical  Suicidal Thoughts:  No  Homicidal Thoughts:  No  Judgement:  Poor  Insight:  Lacking  Psychomotor Activity:  Normal  Akathisia:  No  Fund of Knowledge:  NA      Assets:  Housing Social Support  Cognition:  Impaired, HIV Dementia and cerebral atrophy   ADL's:  Intact  AIMS (if indicated):  Other History   These have been pulled in through the EMR, reviewed, and updated if appropriate.  Family History:  The patient's family history is not on file.  Medical History: 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   Pneumococcal bacteremia 02/04/2023    Surgical History: Past Surgical History:  Procedure Laterality Date   ORIF ANKLE FRACTURE Right 06/03/2018   Procedure: OPEN REDUCTION INTERNAL FIXATION (ORIF) TRIMALLEOLAR ANKLE FRACTURE WITH SYNDESMOSIS;  Surgeon: Teryl Lucy, MD;  Location: [facility];  Service: Orthopedics;  Laterality: Right;     Medications:   Current Facility-Administered Medications:    amLODipine (NORVASC) tablet 5 mg, 5 mg, Oral, Daily, Bender, Emily, DO, 5 mg at 02/15/23 0911   bictegravir-emtricitabine-tenofovir AF (BIKTARVY) 50-200-25 MG per tablet 1 tablet, 1 tablet, Oral, Daily, Daiva Eves, Lisette Grinder, MD, 1 tablet at 02/15/23 0454   cetaphil lotion, , Topical, BID,  Alexander-Savino, Washington, MD, Given at 02/15/23 0913   feeding supplement (ENSURE ENLIVE / ENSURE PLUS) liquid 237 mL, 237 mL, Oral, BID BM, Hoffman, Erik C, DO, 237 mL at 02/15/23 1432   folic acid (FOLVITE) tablet 1 mg, 1 mg, Oral, Daily, Marrianne Mood, MD, 1 mg at 02/15/23 0981   multivitamin with minerals tablet 1 tablet, 1 tablet, Oral, Daily, Marrianne Mood, MD, 1 tablet at 02/15/23 2243   OLANZapine (ZYPREXA) injection 5 mg, 5 mg, Intramuscular, BID PRN, Peterson Ao, MD, 5 mg at 02/12/23 2302   OLANZapine zydis (ZYPREXA) disintegrating tablet 10 mg, 10 mg, Oral, QHS, Peterson Ao, MD, 10 mg at 02/15/23 2243   OLANZapine zydis (ZYPREXA) disintegrating tablet 5 mg, 5 mg, Oral, BID, Peterson Ao, MD, 5 mg at 02/15/23 1432   rivaroxaban (XARELTO) tablet 10 mg, 10 mg, Oral, Daily, Gwenevere Abbot, MD, 10 mg at 02/15/23 0912   rosuvastatin (CRESTOR) tablet 5 mg, 5 mg, Oral, Daily, Bender, Emily, DO, 5 mg at 02/15/23 0911   senna (SENOKOT) tablet 8.6 mg, 1 tablet, Oral, Daily, Alexander-Savino, Washington, MD, 8.6 mg at 02/15/23 0912   sodium chloride flush (NS) 0.9 % injection 3 mL, 3 mL, Intravenous, Q12H, Marrianne Mood, MD, 3 mL at 02/15/23 2244   sodium chloride flush (NS) 0.9 % injection 3 mL, 3 mL, Intravenous, PRN, Marrianne Mood, MD   sulfamethoxazole-trimethoprim (BACTRIM DS) 800-160 MG per tablet 1 tablet, 1 tablet, Oral, Daily, Bender, Emily, DO, 1 tablet at 02/15/23 0911   thiamine (VITAMIN B1) tablet 100 mg, 100 mg, Oral, Daily, 100 mg at 02/15/23 0911 **OR** thiamine (VITAMIN B1) injection 100 mg, 100 mg, Intravenous, Daily, Marrianne Mood, MD, 100 mg at 02/09/23 0941   urea 10 % lotion, , Topical, Daily, Manuela Neptune, MD, Given at 02/15/23 0913  Allergies: No Known Allergies  Peterson Ao, MD

## 2023-02-16 NOTE — Plan of Care (Signed)

## 2023-02-16 NOTE — Progress Notes (Signed)
Nutrition Follow-up  DOCUMENTATION CODES:   Non-severe (moderate) malnutrition in context of chronic illness  INTERVENTION:  Continue regular diet Ensure Plus High Protein po BID, each supplement provides 350 kcal and 20 grams of protein. Multivitamin/minerals No further nutrition interventions planned at this time.  Please re-consult as needed.      NUTRITION DIAGNOSIS:   Moderate Malnutrition related to chronic illness as evidenced by estimated needs, moderate fat depletion, moderate muscle depletion.  Improving with interventions in place.  GOAL:   Patient will meet greater than or equal to 90% of their needs    MONITOR:   PO intake, Supplement acceptance  REASON FOR ASSESSMENT:   Malnutrition Screening Tool    ASSESSMENT:   58 y.o. M, presented to ED for evaluation of altered mental status. Admitted with Pneumococcal bacteremia. PMH; remote history of ETOH and cocaine abuse.  Continues to have good appetite with no concerns.  Consuming 100% of meals and Ensure.  Suspect patient is at good nutrition baseline, and per MD pt medically stable for discharge with no bed placement offers. Nutrition to sign off.   NUTRITION - FOCUSED PHYSICAL EXAM:  Flowsheet Row Most Recent Value  Orbital Region Moderate depletion  Upper Arm Region Moderate depletion  Thoracic and Lumbar Region Moderate depletion  Buccal Region Mild depletion  Temple Region Mild depletion  Clavicle Bone Region Moderate depletion  Clavicle and Acromion Bone Region Moderate depletion  Scapular Bone Region No depletion  Dorsal Hand Unable to assess  Anterior Thigh Region Mild depletion  Posterior Calf Region Mild depletion  Edema (RD Assessment) None  Hair Reviewed  Eyes Reviewed  Mouth Reviewed  Skin Reviewed  Nails Reviewed       Diet Order:   Diet Order             Diet regular Room service appropriate? No; Fluid consistency: Thin  Diet effective now                    EDUCATION NEEDS:   Not appropriate for education at this time  Skin:  Skin Assessment: Reviewed RN Assessment  Last BM:  1/18  Height:   Ht Readings from Last 1 Encounters:  02/03/23 5\' 6"  (1.676 m)    Weight:   Wt Readings from Last 1 Encounters:  02/03/23 57.8 kg    Ideal Body Weight:     BMI:  Body mass index is 20.57 kg/m.  Estimated Nutritional Needs:   Kcal:  1750-2050 kcal  Protein:  75-90 g  Fluid:  65ml/kcal    Jamelle Haring RDN, LDN Clinical Dietitian   If unable to reach, please contact "RD Inpatient" secure chat group between 8 am-4 pm daily"

## 2023-02-16 NOTE — TOC Progression Note (Addendum)
Transition of Care [facility]) - Progression Note    Patient Details  Name: Russell MICHAELIS MRN: [id number] Date of Birth: 02-24-1965  Transition of Care [facility] CM/SW Contact  Janae Bridgeman, RN Phone Number: 02/16/2023, 11:56 AM  Clinical Narrative:    CM spoke with the MD Team and patient is medically stable for discharge but has no bed offers for placement at this time.  I spoke with Marc Morgans, MD and she states that she spoke with the patient's sister on Saturday and she works during the day and is unable to provide care.  The patient remains DTP at this time.  Leadership Team with [facility] will be updated regarding patient's barriers to discharge and no bed offers available.  Patient was faxed to [facility] and [facility] for review.  No bed offers at this time.  I called and was unable to reach the patient's sister and unable to leave a voicemail with her.  I called and left a detailed message with [facility] admission to see if they have available LTC beds.        Expected Discharge Plan and Services                                               Social Determinants of Health (SDOH) Interventions SDOH Screenings   Food Insecurity: No Food Insecurity (02/04/2023)  Housing: High Risk (02/04/2023)  Transportation Needs: No Transportation Needs (02/04/2023)  Utilities: Not At Risk (02/04/2023)  Tobacco Use: High Risk (02/03/2023)    Readmission Risk Interventions     No data to display

## 2023-02-16 NOTE — Progress Notes (Addendum)
Overview: Russell [name] is a 58 y.o. male with remote history of cocaine use disorder and housing instability presenting with deteriorating mental status and admitted for septic encephalopathy.   Overnight:NAEON  Subjective: Is not having any chills, fevers. He is still disoriented. Drinking, eating. Wants to sleep.   Objective:  Vital signs in last 24 hours: Vitals:   02/15/23 0335 02/15/23 0754 02/15/23 1600 02/16/23 0822  BP: (!) 116/102 123/79 (!) 155/103 123/80  Pulse: (!) 103 87 (!) 107 90  Resp: 18 16    Temp: 98.6 F (37 C) 97.8 F (36.6 C) 98.3 F (36.8 C) 98.7 F (37.1 C)  TempSrc: Oral  Oral   SpO2: 98% 99% 100% 99%  Weight:      Height:       Supplemental O2: Room Air Last BM Date : 02/14/23 SpO2: 99 % Filed Weights   02/03/23 1800  Weight: 57.8 kg    Intake/Output Summary (Last 24 hours) at 02/16/2023 1125 Last data filed at 02/15/2023 2244 Gross per 24 hour  Intake 3 ml  Output --  Net 3 ml   Net IO Since Admission: 30,957.41 mL [02/16/23 1125]  Physical Exam General: NAD HENT: NCAT Lungs:  breathing comfortably on room air, lungs clear to auscultation bilaterally Cardiovascular: rrr,no mrg, well perfused, no BLE edema MSK: No asymmetry Skin: ichthyotic thick scale on the lower right extremity. Neuro: alert and oriented x2, thinks he is in prison and it is 1967 Psych: irritable  Diagnostics    Latest Ref Rng & Units 02/16/2023    9:08 AM 02/15/2023    6:38 AM 02/14/2023    6:36 AM  CBC  WBC 4.0 - 10.5 K/uL 3.5  3.8  4.0   Hemoglobin 13.0 - 17.0 g/dL 23.5  57.3  22.0   Hematocrit 39.0 - 52.0 % 36.1  34.6  35.4   Platelets 150 - 400 K/uL 94  88  86       Latest Ref Rng & Units 02/16/2023    9:08 AM 02/15/2023    6:38 AM 02/14/2023    6:36 AM  BMP  Glucose 70 - 99 mg/dL 254  91  95   BUN 6 - 20 mg/dL 17  12  13    Creatinine 0.61 - 1.24 mg/dL 2.70  6.23  7.62   Sodium 135 - 145 mmol/L 132  129  132   Potassium 3.5 - 5.1 mmol/L 4.0  3.8   4.0   Chloride 98 - 111 mmol/L 105  105  108   CO2 22 - 32 mmol/L 20  20  19    Calcium 8.9 - 10.3 mg/dL 8.4  8.2  8.3     Assessment/Plan: Principal Problem:   Pneumococcal bacteremia Active Problems:   Sepsis (HCC)   Fall   Encephalopathy   Community acquired pneumonia   Thrombocytopenia (HCC)   Hematuria   Homelessness   Alcoholism (HCC)   AIDS (HCC)   Positive RPR test   Cholelithiasis without cholecystitis   Aortic atherosclerosis (HCC)   Cerebral atrophy (HCC)   Horseshoe kidney   Malnutrition of moderate degree   HIV dementia (HCC)   HCV (hepatitis C virus)   Pulmonary hypertension (HCC)   Right ventricular systolic dysfunction   Biological false positive RPR test   Onychomycosis   Agitation  HIV c/b AIDS and HIV Dementia History of Positive RPR Patient at baseline, still confused. No agitation since starting zyprexa although at times he has refused IV meds.  Has not required any IV meds since 1/19. No longer IVC'ed. Pending placement at SNF or memory facility. Appreciate psych and TOC.  -Cw Zyprexa 5 mg in the Am and PM, and 10 mg at bedtime  -Repeat EKG to check Qtc, goal Mg >2 (repleted) K >4 -cw Biktarvy for his HIV -cw bactrim for PJP prophylaxis.  -Delirium precautions.  -Repeat RPR tomorrow given history of positive RPR  HTN ON BP was elevated but right now it is normotensive. Could have been related to agitation ON.  -Will continue on 5mg  of amlodipine for now.   HCV Early Cirrhosis  Thrombocytopenia Pt with positive HCV with elevated viral load and imaging showing early cirrhosis. Thrombocytopenia stable around 80. Will need OP follow up. Will start lactulose 10g BID for goal BM 2-3 a day.  -CTM BM.   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 Only unilateral on the right extremity and severe. - Continue urea cream and moisturizer  Severely reduced right ventricular systolic fxn Pulmonary HTN Per cardiology, pt is a poor cadidate for  RH catheterization and advanced cardiac therapies.    Hyponatremia Mild and chronic. CTM.   Resolved problems: AKI-resolved.  Severe Sepsis 2/2 to strep bacteremia resolved.   Diet: Normal IVF: None VTE: Xarelto Code: Full PT/OT recs: None, none. Prior to Admission Living Arrangement: Home Anticipated Discharge Location: SNF or memory facility Barriers to Discharge: Medical Stability Dispo: Anticipated discharge in approximately 1 day(s).   Manuela Neptune, MD  Eligha Bridegroom. [facility], PGY-1  Pager: [phone] After 5 pm and on weekends: Please call the on-call pager

## 2023-02-17 LAB — BASIC METABOLIC PANEL
Anion gap: 6 (ref 5–15)
BUN: 14 mg/dL (ref 6–20)
CO2: 18 mmol/L — ABNORMAL LOW (ref 22–32)
Calcium: 8.2 mg/dL — ABNORMAL LOW (ref 8.9–10.3)
Chloride: 105 mmol/L (ref 98–111)
Creatinine, Ser: 1.15 mg/dL (ref 0.61–1.24)
GFR, Estimated: >60 mL/min (ref >60)
Glucose, Bld: 92 mg/dL (ref 70–99)
Potassium: 4.2 mmol/L (ref 3.5–5.1)
Sodium: 129 mmol/L — ABNORMAL LOW (ref 135–145)

## 2023-02-17 LAB — RPR
RPR Ser Ql: REACTIVE — AB
RPR Titer: 1:2 {titer}

## 2023-02-17 LAB — MAGNESIUM: Magnesium: 1.9 mg/dL (ref 1.7–2.4)

## 2023-02-17 MED ORDER — PENICILLIN G BENZATHINE 1200000 UNIT/2ML IM SUSY
2.4000 10*6.[IU] | PREFILLED_SYRINGE | Freq: Once | INTRAMUSCULAR | Status: AC
  Administered 2023-02-17: 2.4 10*6.[IU] via INTRAMUSCULAR
  Filled 2023-02-17: qty 4

## 2023-02-17 NOTE — Progress Notes (Signed)
Summary: Russell [name] is a 58 y.o. male with remote history of cocaine use disorder and housing instability presenting with deteriorating mental status and admitted for septic encephalopathy.   Hospital day: 14  Subjective:  Patient seen and evaluated while sitting on the side of his hospital bed. He reports he is "alright." Denies difficulty breathing and endorses eating and drink ok. Endorses 2-3 bowel movements per day. No complaints.   Objective:  Vital signs in last 24 hours: Vitals:   02/16/23 1741 02/16/23 2152 02/17/23 0605 02/17/23 0828  BP: 130/72 (!) 138/90 120/85 130/80  Pulse: 83 (!) 103 100 94  Resp:  18 20   Temp: 98.5 F (36.9 C) (!) 97.5 F (36.4 C) (!) 97.4 F (36.3 C) 97.8 F (36.6 C)  TempSrc:  Oral Oral Oral  SpO2: 100% 100% 99% 100%  Weight:      Height:       Physical Exam Cardiovascular:     Rate and Rhythm: Normal rate and regular rhythm.     Heart sounds: No murmur heard.    No friction rub. No gallop.  Pulmonary:     Effort: Pulmonary effort is normal.     Breath sounds: Normal breath sounds. No rales.  Musculoskeletal:        General: No swelling.  Skin:    General: Skin is dry.  Neurological:     Mental Status: He is alert.     Comments: Oriented to person, place and year but not to month or day    Weight change:   Intake/Output Summary (Last 24 hours) at 02/17/2023 1053 Last data filed at 02/17/2023 0028 Gross per 24 hour  Intake 480 ml  Output --  Net 480 ml      Latest Ref Rng & Units 02/16/2023    9:08 AM 02/15/2023    6:38 AM 02/14/2023    6:36 AM  CBC  WBC 4.0 - 10.5 K/uL 3.5  3.8  4.0   Hemoglobin 13.0 - 17.0 g/dL 16.1  09.6  04.5   Hematocrit 39.0 - 52.0 % 36.1  34.6  35.4   Platelets 150 - 400 K/uL 94  88  86       Latest Ref Rng & Units 02/17/2023    5:44 AM 02/16/2023    9:08 AM 02/15/2023    6:38 AM  CMP  Glucose 70 - 99 mg/dL 92  409  91   BUN 6 - 20 mg/dL 14  17  12    Creatinine 0.61 - 1.24 mg/dL 8.11   9.14  7.82   Sodium 135 - 145 mmol/L 129  132  129   Potassium 3.5 - 5.1 mmol/L 4.2  4.0  3.8   Chloride 98 - 111 mmol/L 105  105  105   CO2 22 - 32 mmol/L 18  20  20    Calcium 8.9 - 10.3 mg/dL 8.2  8.4  8.2    RPR: Reactive; titer 1:2 EKG: QT/QTcB 350/462  Calculated Serum Osmolality: 268  Assessment/Plan:  Principal Problem:   Pneumococcal bacteremia Active Problems:   Sepsis (HCC)   Fall   Encephalopathy   Community acquired pneumonia   Thrombocytopenia (HCC)   Hematuria   Homelessness   Alcoholism (HCC)   AIDS (HCC)   Positive RPR test   Cholelithiasis without cholecystitis   Aortic atherosclerosis (HCC)   Cerebral atrophy (HCC)   Horseshoe kidney   Malnutrition of moderate degree   HIV dementia (HCC)  HCV (hepatitis C virus)   Pulmonary hypertension (HCC)   Right ventricular systolic dysfunction   Biological false positive RPR test   Onychomycosis   Agitation  Russell [name] is a 58 y.o. male with remote history of cocaine use disorder and housing instability presenting with deteriorating mental status and admitted for septic encephalopathy on HD 3.   HIV AIDS and HIV Dementia History of Positive RPR Mentation improved and more cooperative on exam today. Oriented to person, place and year. Did not know reason for hospitalization; reported he was in the hospital because he was "acting crazy." Team discussed with patient his hospital course and reasoning for hospitalization. Patient appeared to understand. RPR reactive and titers 1:2. Will treat for syphilis. TOC/SW following, appreciate assistance. Waiting for SNF placement as patient does not have offer yet. - Continue Zyprexa 5 mg in the Am and PM, and 10 mg at bedtime  - Continue Biktarvy for his HIV - Continue bactrim for PJP prophylaxis.  - Delirium precautions  - IM Penicillin 2.4 million units   HTN  One hypertensive episode overnight. BP normotensive this AM. - Continue on 5mg  of amlodipine    HCV Early Cirrhosis  Thrombocytopenia Positive HCV with elevated viral load and imaging revealing early cirrhosis. Thrombocytopenia stable yesterday (94). Will need outpatient follow up. Lactulose started yesterday. Patient reports 2-3 BM per day, which is improved.  - Continue lactulose 10 mg BID with goal of 2-3 BM per day  Icthyosis  Unilateral on the R lower extremity and severe.  - Continue urea cream and moisturizer   Severely reduced right ventricular systolic fxn Pulmonary HTN Per cardiology, pt is a poor cadidate for RH catheterization and advanced cardiac therapies.   Hyponatremia Mild. Sodium 129 today from 132 yesterday. Serum osmolality calculated at 268 indicating hypotonic hyponatremia. Suspect this is 2/2 to AIDS infection.  - BMP tomorrow AM  Resolved problems: AKI-resolved.  Severe Sepsis 2/2 to strep bacteremia resolved.    Diet: Normal IVF: None VTE: Xarelto Code: Full PT/OT recs: None, none. Prior to Admission Living Arrangement: Home Anticipated Discharge Location: SNF or memory facility Barriers to Discharge: Medical Stability  Dispo: Anticipated discharge in approximately 1 day(s).    LOS: 14 days   [name], Medical Student 02/17/2023, 10:53 AM

## 2023-02-17 NOTE — Progress Notes (Signed)
Physical Therapy Treatment Patient Details Name: Russell [name] MRN: [id number] DOB: [birth year] Today's Date: 02/17/2023   History of Present Illness Pt is a 58 yo male who presented to [facility] ED on [facility]/03/23 with a chief complaint of fall and AMS, Pt with pneumococcal bacteremia and found to be HIV+. Pt seen by psychiatry and found unable to make own decisions.  PMH remote cocaine use and alcohol use.    PT Comments  Showing a bit of progress towards acute functional goals and better cognition today from prior visits. Pt agreeable to participate with therapy but needs motivation and intermittent redirection. Shows some improved focus on task at hand. Supervision for safety and for direction to orient back to room while ambulating with poor recall.  Minor instability but self correct, and intermittently uses furniture for support. Patient will benefit from continued inpatient follow up therapy, <3 hours/day. Patient will continue to benefit from skilled physical therapy services to further improve independence with functional mobility.    If plan is discharge home, recommend the following: Assistance with cooking/housework;Direct supervision/assist for medications management;Direct supervision/assist for financial management;Assist for transportation;Help with stairs or ramp for entrance;Supervision due to cognitive status;A little help with walking and/or transfers;A little help with bathing/dressing/bathroom   Can travel by private vehicle     No  Equipment Recommendations  None recommended by PT    Recommendations for Other Services       Precautions / Restrictions Precautions Precautions: Fall Restrictions Weight Bearing Restrictions Per Provider Order: No     Mobility  Bed Mobility Overal bed mobility: Needs Assistance Bed Mobility: Supine to Sit     Supine to sit: Supervision     General bed mobility comments: Supervision for safety, a bit impulsive to rise. Able to donne his  shoes EOB..    Transfers Overall transfer level: Needs assistance Equipment used: None Transfers: Sit to/from Stand Sit to Stand: Contact guard assist           General transfer comment: CGA for safety, a bit reckless when returning to sit on bed x2, close to the edge, requires guard and cues for awareness to back all the way up prior to sitting. Grasps furniture upon standing but self corrects    Ambulation/Gait Ambulation/Gait assistance: Supervision Gait Distance (Feet): 125 Feet Assistive device: None Gait Pattern/deviations: Step-through pattern, Drifts right/left, Festinating Gait velocity: decr Gait velocity interpretation: <1.8 ft/sec, indicate of risk for recurrent falls   General Gait Details: Supervision for safety, increased sway in hallway, nearly staggering but self corrects. uses furniture to hold in room and on rare occasion in hallway. Cues for safety, awareness, and symmetry of gait. Shows some intermittent forward festination but self corrects. Needs cues to orient back to room despite staying on one hallway.   Stairs             Wheelchair Mobility     Tilt Bed    Modified Rankin (Stroke Patients Only)       Balance Overall balance assessment: Needs assistance Sitting-balance support: No upper extremity supported, Feet supported Sitting balance-Leahy Scale: Good Sitting balance - Comments: Can reach forward/down to put shoes on   Standing balance support: No upper extremity supported, During functional activity Standing balance-Leahy Scale: Fair Standing balance comment: close supervision                            Cognition Arousal: Alert Behavior During Therapy: Impulsive Overall Cognitive Status:  No family/caregiver present to determine baseline cognitive functioning                                 General Comments: Still a bit distracted by people in the hallway but seems more focused today. Recalled room  number #7 but could not find way back to room and did not comprehend descending order of room numbers while walking back towards his room.        Exercises      General Comments General comments (skin integrity, edema, etc.): Needs redirected at times      Pertinent Vitals/Pain Pain Assessment Pain Assessment: No/denies pain    Home Living                          Prior Function            PT Goals (current goals can now be found in the care plan section) Acute Rehab PT Goals Patient Stated Goal: none stated PT Goal Formulation: Patient unable to participate in goal setting Time For Goal Achievement: 02/23/23 Potential to Achieve Goals: Fair Progress towards PT goals: Progressing toward goals    Frequency    Min 1X/week      PT Plan      Co-evaluation              AM-PAC PT "6 Clicks" Mobility   Outcome Measure  Help needed turning from your back to your side while in a flat bed without using bedrails?: A Little Help needed moving from lying on your back to sitting on the side of a flat bed without using bedrails?: A Little Help needed moving to and from a bed to a chair (including a wheelchair)?: A Little Help needed standing up from a chair using your arms (e.g., wheelchair or bedside chair)?: A Little Help needed to walk in hospital room?: A Little Help needed climbing 3-5 steps with a railing? : A Little 6 Click Score: 18    End of Session Equipment Utilized During Treatment: Gait belt Activity Tolerance: Patient tolerated treatment well Patient left: in bed;with call bell/phone within reach;with bed alarm set   PT Visit Diagnosis: Unsteadiness on feet (R26.81);Muscle weakness (generalized) (M62.81);Difficulty in walking, not elsewhere classified (R26.2);Other symptoms and signs involving the nervous system (R29.898)     Time: 1478-2956 PT Time Calculation (min) (ACUTE ONLY): 10 min  Charges:    $Gait Training: 8-22 mins PT  General Charges $$ ACUTE PT VISIT: 1 Visit                     Kathlyn Sacramento, PT, DPT [facility]  Rehabilitation Services Physical Therapist Office: [phone] Website: [email or web address]    Berton Mount 02/17/2023, 4:41 PM

## 2023-02-17 NOTE — Plan of Care (Signed)

## 2023-02-18 LAB — SODIUM, URINE, RANDOM: Sodium, Ur: 32 mmol/L

## 2023-02-18 LAB — BASIC METABOLIC PANEL WITH GFR
Anion gap: 7 (ref 5–15)
BUN: 14 mg/dL (ref 6–20)
CO2: 18 mmol/L — ABNORMAL LOW (ref 22–32)
Calcium: 8 mg/dL — ABNORMAL LOW (ref 8.9–10.3)
Chloride: 101 mmol/L (ref 98–111)
Creatinine, Ser: 1.08 mg/dL (ref 0.61–1.24)
GFR, Estimated: >60 mL/min
Glucose, Bld: 92 mg/dL (ref 70–99)
Potassium: 3.8 mmol/L (ref 3.5–5.1)
Sodium: 126 mmol/L — ABNORMAL LOW (ref 135–145)

## 2023-02-18 LAB — HIV-1 RNA QUANT-NO REFLEX-BLD
HIV 1 RNA Quant: 510 {copies}/mL
LOG10 HIV-1 RNA: 2.708 {Log}

## 2023-02-18 LAB — T.PALLIDUM AB, TOTAL: T Pallidum Abs: NONREACTIVE

## 2023-02-18 LAB — T-HELPER CELLS (CD4) COUNT (NOT AT ARMC)
CD4 % Helper T Cell: 17 % — ABNORMAL LOW (ref 33–65)
CD4 T Cell Abs: 146 /uL — ABNORMAL LOW (ref 400–1790)

## 2023-02-18 LAB — OSMOLALITY, URINE: Osmolality, Ur: 467 mosm/kg (ref 300–900)

## 2023-02-18 NOTE — Progress Notes (Signed)
Summary: Russell [name] is a 58 y.o. male with remote history of cocaine use disorder and housing instability presenting with deteriorating mental status and admitted for septic encephalopathy.   Hospital day: 15  Subjective:  Patient seen and evaluated while sitting on the side of his hospital bed. He continues to report he is "doing alright." He denies difficulty breathing, CP or difficulty swallowing. He endorses eating and drinking ok and having ~2 bowel movements yesterday. When asked, he did not know the correct reason for his current hospitalization. No other complaints.   Objective:  Vital signs in last 24 hours: Vitals:   02/17/23 1521 02/17/23 2253 02/18/23 0602 02/18/23 0825  BP: (!) 143/75 131/86 124/84 115/82  Pulse: 81 (!) 106 98 99  Resp:  20 16   Temp: 97.6 F (36.4 C) 98 F (36.7 C) 98.6 F (37 C) 97.6 F (36.4 C)  TempSrc: Oral Oral Oral Oral  SpO2: 97% 99% 100% 99%  Weight:      Height:       Physical Exam Cardiovascular:     Rate and Rhythm: Normal rate and regular rhythm.     Heart sounds: No murmur heard.    No gallop.  Pulmonary:     Effort: Pulmonary effort is normal.     Breath sounds: Normal breath sounds.  Neurological:     Mental Status: He is alert.     Comments: Oriented to person, not to time or place    Weight change:   Intake/Output Summary (Last 24 hours) at 02/18/2023 1131 Last data filed at 02/17/2023 2229 Gross per 24 hour  Intake 240 ml  Output --  Net 240 ml      Latest Ref Rng & Units 02/18/2023    5:58 AM 02/17/2023    5:44 AM 02/16/2023    9:08 AM  BMP  Glucose 70 - 99 mg/dL 92  92  540   BUN 6 - 20 mg/dL 14  14  17    Creatinine 0.61 - 1.24 mg/dL 9.81  1.91  4.78   Sodium 135 - 145 mmol/L 126  129  132   Potassium 3.5 - 5.1 mmol/L 3.8  4.2  4.0   Chloride 98 - 111 mmol/L 101  105  105   CO2 22 - 32 mmol/L 18  18  20    Calcium 8.9 - 10.3 mg/dL 8.0  8.2  8.4    CD4 T Cell Abs: 146 CD4 % Helper T Cell:  17%  Assessment/Plan:  Principal Problem:   Pneumococcal bacteremia Active Problems:   Sepsis (HCC)   Fall   Encephalopathy   Community acquired pneumonia   Thrombocytopenia (HCC)   Hematuria   Homelessness   Alcoholism (HCC)   AIDS (HCC)   Positive RPR test   Cholelithiasis without cholecystitis   Aortic atherosclerosis (HCC)   Cerebral atrophy (HCC)   Horseshoe kidney   Malnutrition of moderate degree   HIV dementia (HCC)   HCV (hepatitis C virus)   Pulmonary hypertension (HCC)   Right ventricular systolic dysfunction   Biological false positive RPR test   Onychomycosis   Agitation  Russell [name] is a 58 y.o. male with remote history of cocaine use disorder and housing instability presenting with deteriorating mental status and admitted for septic encephalopathy on HD 75.    HIV/AIDS and HIV Dementia History of Positive RPR Mentation about the same as yesterday. Patient oriented to person, not to time or place. Patient did not know  the correct reason he was hospitalized. Team explained at bedside about his current health status and reason for admission. Team will plan to update sister. TOC/SW following, appreciate assistance. Waiting for SNF placement. - Continue Zyprexa 5 mg in the Am and PM, and 10 mg at bedtime  - Continue Biktarvy for his HIV - Continue bactrim for PJP prophylaxis.  - Delirium precautions  - S/p IM Penicillin 2.4 million units (1/21)   HTN  Patient normotensive overnight. - Continue to monitor - Continue on 5mg  of amlodipine    HCV Early Cirrhosis  Thrombocytopenia Positive HCV with elevated viral load and imaging revealing early cirrhosis. Platelets have been stable. Will need outpatient follow up. Patient reports ~2 BMs per day, which is within goal. 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  - Continue urea cream and moisturizer    Severely reduced right  ventricular systolic fxn Pulmonary HTN Per cardiology, pt is a poor cadidate for RH catheterization and advanced cardiac therapies.    Hyponatremia Sodium 126 today from 129 yesterday. Patient has been mildly hyponatremic throughout admission. Serum osmolality calculated at 262 indicating hypotonic hyponatremia. Urine studies ordered. Suspect hyponatremia is 2/2 to AIDS infection.  - Urine sodium - Urine osmolality  - BMP tomorrow AM   Resolved problems: AKI-resolved.  Severe Sepsis 2/2 to strep bacteremia resolved.    Diet: Normal IVF: None VTE: Xarelto Code: Full PT/OT recs: None, none. Prior to Admission Living Arrangement: Home Anticipated Discharge Location: SNF or memory facility Barriers to Discharge: Medical Stability   Dispo: Anticipated discharge in approximately 1 day(s)   LOS: 15 days   [name], Medical Student 02/18/2023, 11:31 AM

## 2023-02-18 NOTE — Plan of Care (Signed)
  Problem: Education: Goal: Knowledge of General Education information will improve Description: Including pain rating scale, medication(s)/side effects and non-pharmacologic comfort measures Outcome: Progressing   Problem: Clinical Measurements: Goal: Will remain free from infection Outcome: Progressing   Problem: Clinical Measurements: Goal: Respiratory complications will improve Outcome: Progressing   Problem: Nutrition: Goal: Adequate nutrition will be maintained Outcome: Progressing   Problem: Coping: Goal: Level of anxiety will decrease Outcome: Progressing   Problem: Pain Management: Goal: General experience of comfort will improve Outcome: Progressing   Problem: Skin Integrity: Goal: Risk for impaired skin integrity will decrease Outcome: Progressing

## 2023-02-18 NOTE — TOC Progression Note (Addendum)
Transition of Care [facility]) - Progression Note    Patient Details  Name: Russell [name] MRN: [id number] Date of Birth: [birth year]  Transition of Care [facility]) CM/SW Contact  Jemya Depierro A [place], [place] Phone Number: 02/18/2023, 3:44 PM  Clinical Narrative:      CSW was abel to reach pt's sister, [phone] to discuss plan for DC.   She stated she has been made aware that the pt does not have capacity and is open with idea of being pt's legal guardian if that is the route taken.  She stated that pt is unable to return home due to her variable work schedule and that she is the only living family member that is close with the pt. She said that even with home services, she would not be ok taking pt home unless there was the option for 24/7 care. She also said that she does not have the funds to pay out of pocket for those services.   CSW notified her that pt does not currently have any bed offers and that he is a difficult to place pt due to his substance use history and medical needs.   CSW inquired if pt had any funds, she stated that to her knowledge pt does not receive any disability and has no funds or pending financial resources.   CSW to re-fax pt out and reach out to [facility] SS to discuss need for guardianship and applying for long term medicaid or possibly special assistance medicaid. CSW also reached out to [employer], DSS social [phone],  and left VM with contact information to reach back out to CSW.   Barriers to discharge: Funds, No bed offers, Payor source      Expected Discharge Plan and Services                                               Social Determinants of Health (SDOH) Interventions SDOH Screenings   Food Insecurity: No Food Insecurity (02/04/2023)  Housing: High Risk (02/04/2023)  Transportation Needs: No Transportation Needs (02/04/2023)  Utilities: Not At Risk (02/04/2023)  Tobacco Use: High Risk (02/03/2023)    Readmission Risk Interventions      No data to display

## 2023-02-18 NOTE — Plan of Care (Signed)

## 2023-02-19 DIAGNOSIS — B182 Chronic viral hepatitis C: Secondary | ICD-10-CM | POA: Diagnosis not present

## 2023-02-19 DIAGNOSIS — F0390 Unspecified dementia without behavioral disturbance: Secondary | ICD-10-CM | POA: Diagnosis not present

## 2023-02-19 DIAGNOSIS — J154 Pneumonia due to other streptococci: Secondary | ICD-10-CM | POA: Diagnosis not present

## 2023-02-19 DIAGNOSIS — B20 Human immunodeficiency virus [HIV] disease: Secondary | ICD-10-CM | POA: Diagnosis not present

## 2023-02-19 LAB — BASIC METABOLIC PANEL
Anion gap: 5 (ref 5–15)
BUN: 16 mg/dL (ref 6–20)
CO2: 21 mmol/L — ABNORMAL LOW (ref 22–32)
Calcium: 8.1 mg/dL — ABNORMAL LOW (ref 8.9–10.3)
Chloride: 99 mmol/L (ref 98–111)
Creatinine, Ser: 1.18 mg/dL (ref 0.61–1.24)
GFR, Estimated: >60 mL/min (ref >60)
Glucose, Bld: 97 mg/dL (ref 70–99)
Potassium: 3.5 mmol/L (ref 3.5–5.1)
Sodium: 125 mmol/L — ABNORMAL LOW (ref 135–145)

## 2023-02-19 LAB — SODIUM, URINE, RANDOM: Sodium, Ur: 27 mmol/L

## 2023-02-19 LAB — OSMOLALITY, URINE: Osmolality, Ur: 636 mosm/kg (ref 300–900)

## 2023-02-19 LAB — SODIUM: Sodium: 133 mmol/L — ABNORMAL LOW (ref 135–145)

## 2023-02-19 LAB — HIV-1 RNA QUANT-NO REFLEX-BLD
HIV 1 RNA Quant: 450 {copies}/mL
LOG10 HIV-1 RNA: 2.653 {Log}

## 2023-02-19 MED ORDER — SODIUM CHLORIDE 0.9 % IV BOLUS
500.0000 mL | Freq: Once | INTRAVENOUS | Status: AC
  Administered 2023-02-19: 500 mL via INTRAVENOUS

## 2023-02-19 NOTE — Progress Notes (Signed)
[facility]   Reason for visit: Follow up on HIV and pneumococcal bacteremia  Interval History: WBC of 3.5 last check.  He remains afebrile.No complaints today. Working on placement. no issues with medication   Physical Exam: Constitutional:  Vitals:   02/19/23 0424 02/19/23 0730  BP: (!) 115/101 116/83  Pulse: 100 (!) 103  Resp: 17 17  Temp: 97.9 F (36.6 C) 97.9 F (36.6 C)  SpO2: 100% 100%   patient appears in NAD Respiratory: Normal respiratory effort  Review of Systems: Constitutional: negative for fevers and chills  Lab Results  Component Value Date   WBC 3.5 (L) 02/16/2023   HGB 12.5 (L) 02/16/2023   HCT 36.1 (L) 02/16/2023   MCV 98.6 02/16/2023   PLT 94 (L) 02/16/2023    Lab Results  Component Value Date   CREATININE 1.18 02/19/2023   BUN 16 02/19/2023   NA 125 (L) 02/19/2023   K 3.5 02/19/2023   CL 99 02/19/2023   CO2 21 (L) 02/19/2023    Lab Results  Component Value Date   ALT 31 02/12/2023   AST 77 (H) 02/12/2023   ALKPHOS 74 02/12/2023     Microbiology: Recent Results (from the past 240 hours)  Resp panel by RT-PCR (RSV, Flu A&B, Covid) Anterior Nasal Swab     Status: None   Collection Time: 02/11/23 11:45 AM   Specimen: Anterior Nasal Swab  Result Value Ref Range Status   SARS Coronavirus 2 by RT PCR NEGATIVE NEGATIVE Final   Influenza A by PCR NEGATIVE NEGATIVE Final   Influenza B by PCR NEGATIVE NEGATIVE Final    Comment: (NOTE) The Xpert Xpress SARS-CoV-2/FLU/RSV plus assay is intended as an aid in the diagnosis of influenza from Nasopharyngeal swab specimens and should not be used as a sole basis for treatment. Nasal washings and aspirates are unacceptable for Xpert Xpress SARS-CoV-2/FLU/RSV testing.  Fact Sheet for Patients: BloggerCourse.com  Fact Sheet for Healthcare Providers: SeriousBroker.it  This test is not yet approved or cleared by the  [place] FDA and has been authorized for detection and/or diagnosis of SARS-CoV-2 by FDA under an Emergency Use Authorization (EUA). This EUA will remain in effect (meaning this test can be used) for the duration of the COVID-19 declaration under Section 564(b)(1) of the Act, 21 U.S.C. section 360bbb-3(b)(1), unless the authorization is terminated or revoked.     Resp Syncytial Virus by PCR NEGATIVE NEGATIVE Final    Comment: (NOTE) Fact Sheet for Patients: BloggerCourse.com  Fact Sheet for Healthcare Providers: SeriousBroker.it  This test is not yet approved or cleared by the [place] FDA and has been authorized for detection and/or diagnosis of SARS-CoV-2 by FDA under an Emergency Use Authorization (EUA). This EUA will remain in effect (meaning this test can be used) for the duration of the COVID-19 declaration under Section 564(b)(1) of the Act, 21 U.S.C. section 360bbb-3(b)(1), unless the authorization is terminated or revoked.  Performed at [facility], [address], [place], [place] [address]   Culture, blood (Routine X 2) w Reflex to ID Panel     Status: None   Collection Time: 02/11/23  3:17 PM   Specimen: BLOOD  Result Value Ref Range Status   Specimen Description BLOOD SITE NOT SPECIFIED  Final   Special Requests   Final    BOTTLES DRAWN AEROBIC AND ANAEROBIC Blood Culture results may not be optimal due to an inadequate volume of blood received in culture bottles  Culture   Final    NO GROWTH 5 DAYS Performed at [facility], [address] [place], [place] [address]    Report Status 02/16/2023 FINAL  Final  Culture, blood (Routine X 2) w Reflex to ID Panel     Status: None   Collection Time: 02/11/23  3:17 PM   Specimen: BLOOD  Result Value Ref Range Status   Specimen Description BLOOD SITE NOT SPECIFIED  Final   Special Requests   Final    BOTTLES DRAWN AEROBIC AND ANAEROBIC Blood  Culture results may not be optimal due to an inadequate volume of blood received in culture bottles   Culture   Final    NO GROWTH 5 DAYS Performed at [facility], [address] [place], [place] [address]    Report Status 02/16/2023 FINAL  Final    Impression/Plan:  1.  HIV.  We have rechecked the viral load and is down to 510.  CD4 up to 146.  He remains on Biktarvy and will continue with that.  2.  At risk for opportunistic infections.  He remains on Bactrim with a CD4 under 200.  Will continue with this.  3.  Positive RPR.  Confirmatory testing negative.  No indication for treatment.  Repeat treponemal palladium antibody also again negative.  4.  Placement.  Primary team will working on placement options for him.  5.  Chronic hepatitis C.  Can consider treatment as an outpatient.  6.  Cirrhosis.  He has an ultrasound concerning for early cirrhosis and low platelets.  He will need ongoing HCC surveillance.  Follow-up appointment arranged in our office February 18.  At this time will sign off, call with any questions.

## 2023-02-19 NOTE — Plan of Care (Signed)

## 2023-02-19 NOTE — Plan of Care (Signed)
  Problem: Clinical Measurements: Goal: Respiratory complications will improve Outcome: Progressing   Problem: Activity: Goal: Risk for activity intolerance will decrease Outcome: Progressing   Problem: Nutrition: Goal: Adequate nutrition will be maintained Outcome: Progressing   Problem: Elimination: Goal: Will not experience complications related to urinary retention Outcome: Progressing   Problem: Pain Management: Goal: General experience of comfort will improve Outcome: Progressing   Problem: Safety: Goal: Ability to remain free from injury will improve Outcome: Progressing   Problem: Skin Integrity: Goal: Risk for impaired skin integrity will decrease Outcome: Progressing

## 2023-02-19 NOTE — Progress Notes (Addendum)
Summary: Russell [name] is a 58 y.o. male with remote history of cocaine use disorder and housing instability presenting with deteriorating mental status and admitted for septic encephalopathy.   Hospital day: 16  Subjective:  Patient seen and evaluated while sitting in hospital room. Patient reports he is doing ok. He reports eating his breakfast this AM and drinking fluids. Endorses 2-3 BM per day. Denies pain. Reports he is ready to leave the hospital. No other complaints.  Objective:  Vital signs in last 24 hours: Vitals:   02/18/23 1646 02/18/23 2033 02/19/23 0424 02/19/23 0730  BP: 110/77 122/83 (!) 115/101 116/83  Pulse: (!) 107 (!) 113 100 (!) 103  Resp:  17 17 17   Temp: 98.2 F (36.8 C)  97.9 F (36.6 C) 97.9 F (36.6 C)  TempSrc: Oral  Oral Oral  SpO2: 100% 100% 100% 100%  Weight:      Height:       Physical Exam Cardiovascular:     Rate and Rhythm: Normal rate and regular rhythm.  Pulmonary:     Effort: Pulmonary effort is normal.     Breath sounds: Normal breath sounds.  Skin:    General: Skin is dry.  Neurological:     Mental Status: He is alert.     Comments: Not oriented to time; appears frustrated at times when team asks questions   Weight change:   Intake/Output Summary (Last 24 hours) at 02/19/2023 0959 Last data filed at 02/18/2023 1700 Gross per 24 hour  Intake 570 ml  Output --  Net 570 ml      Latest Ref Rng & Units 02/19/2023    7:18 AM 02/18/2023    5:58 AM 02/17/2023    5:44 AM  BMP  Glucose 70 - 99 mg/dL 97  92  92   BUN 6 - 20 mg/dL 16  14  14    Creatinine 0.61 - 1.24 mg/dL 8.29  5.62  1.30   Sodium 135 - 145 mmol/L 125  126  129   Potassium 3.5 - 5.1 mmol/L 3.5  3.8  4.2   Chloride 98 - 111 mmol/L 99  101  105   CO2 22 - 32 mmol/L 21  18  18    Calcium 8.9 - 10.3 mg/dL 8.1  8.0  8.2    Urine osmolality: 467 Urine sodium: 32  HIV RNA-Quant: 510 Log 10 HIV-1 RNA: 2.708  Assessment/Plan:  Principal Problem:   Pneumococcal  bacteremia Active Problems:   Sepsis (HCC)   Fall   Encephalopathy   Community acquired pneumonia   Thrombocytopenia (HCC)   Hematuria   Homelessness   Alcoholism (HCC)   AIDS (HCC)   Positive RPR test   Cholelithiasis without cholecystitis   Aortic atherosclerosis (HCC)   Cerebral atrophy (HCC)   Horseshoe kidney   Malnutrition of moderate degree   HIV dementia (HCC)   HCV (hepatitis C virus)   Pulmonary hypertension (HCC)   Right ventricular systolic dysfunction   Biological false positive RPR test   Onychomycosis   Agitation  [name] is a 58 y.o. male with remote history of cocaine use disorder and housing instability presenting with deteriorating mental status and admitted for septic encephalopathy on HD 16.   Hyponatremia Sodium 125 today from 126 yesterday. Patient has been mildly hyponatremic throughout admission. Serum osmolality calculated at 262 indicating hypotonic hyponatremia with yesterday's Na of 126. Urine osmolality and urine sodium ordered yesterday. Urine osmolality resulted greater than 100 and urine sodium  resulted between 25-40. Will complete NS challenge to determine if hyponatremia is 2/2 poor intake versus SIADH. After NS bolus, will measure urine osmolality and urine sodium. Will order serum sodium for 1600 today.  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 Reports he is ready to leave the hospital. Team updated patient on his current health status and reason for admission. Patient not sure what HIV is when asked. TOC/SW following, appreciate assistance. Waiting for SNF placement. - Continue Zyprexa 5 mg in the Am and PM, and 10 mg at bedtime  - Continue Biktarvy for his HIV - Continue bactrim for PJP prophylaxis.  - Delirium precautions  - S/p IM Penicillin  2.4 million units on 1/21; will need additional IM Penicillin 2.4 million units doses on 1/28 and 2/4 for syphilis treatment   HTN  One episode of hypertension overnight. Normotensive this AM.  - Continue to monitor - Continue on 5mg  of amlodipine    HCV Early Cirrhosis  Thrombocytopenia Positive HCV with elevated viral load and imaging revealing early cirrhosis. Platelets have been stable. Will need outpatient follow up. Patient reports 2-3 BMs per day, which is within goal. 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  - Continue urea cream and moisturizer    Severely reduced right ventricular systolic fxn Pulmonary HTN Per cardiology, pt is a poor cadidate for RH catheterization and advanced cardiac therapies.    Resolved problems: AKI-resolved.  Severe Sepsis 2/2 to strep bacteremia resolved.    Diet: Normal IVF: NS challenge VTE: Xarelto Code: Full PT/OT recs: None, none.  Prior to Admission Living Arrangement: Home Anticipated Discharge Location: SNF or memory facility Barriers to Discharge: Medical Stability   LOS: 16 days   Russell [name], Medical Student 02/19/2023, 9:59 AM

## 2023-02-20 DIAGNOSIS — E871 Hypo-osmolality and hyponatremia: Secondary | ICD-10-CM | POA: Diagnosis not present

## 2023-02-20 DIAGNOSIS — B20 Human immunodeficiency virus [HIV] disease: Secondary | ICD-10-CM | POA: Diagnosis not present

## 2023-02-20 DIAGNOSIS — B953 Streptococcus pneumoniae as the cause of diseases classified elsewhere: Secondary | ICD-10-CM | POA: Diagnosis not present

## 2023-02-20 DIAGNOSIS — R7881 Bacteremia: Secondary | ICD-10-CM | POA: Diagnosis not present

## 2023-02-20 LAB — BASIC METABOLIC PANEL
Anion gap: 7 (ref 5–15)
BUN: 14 mg/dL (ref 6–20)
CO2: 18 mmol/L — ABNORMAL LOW (ref 22–32)
Calcium: 8 mg/dL — ABNORMAL LOW (ref 8.9–10.3)
Chloride: 107 mmol/L (ref 98–111)
Creatinine, Ser: 1.27 mg/dL — ABNORMAL HIGH (ref 0.61–1.24)
GFR, Estimated: >60 mL/min (ref >60)
Glucose, Bld: 79 mg/dL (ref 70–99)
Potassium: 3.9 mmol/L (ref 3.5–5.1)
Sodium: 132 mmol/L — ABNORMAL LOW (ref 135–145)

## 2023-02-20 MED ORDER — STERILE WATER FOR INJECTION IJ SOLN
INTRAMUSCULAR | Status: AC
  Filled 2023-02-20: qty 10

## 2023-02-20 MED ORDER — UREA 10 % EX LOTN
TOPICAL_LOTION | Freq: Two times a day (BID) | CUTANEOUS | Status: DC
Start: 2023-02-20 — End: 2023-02-23
  Administered 2023-02-22: 1 via TOPICAL

## 2023-02-20 NOTE — TOC Progression Note (Signed)
Transition of Care [facility]) - Progression Note    Patient Details  Name: Russell [name] MRN: [id number] Date of Birth: August 14, 1965  [facility] CM/SW Contact  Deajah Erkkila A [place], [place] Phone Number: 02/20/2023, 4:34 PM  Clinical Narrative:     Update 1/129/25 1609 CSW sent out request for Disability application to [employer] to assist with funding source to facility. CSW waiting to hear back regarding assistance.   CSW met with Wyatt Portela, [phone], with [facility] regarding pt. She stated that CSW should reach out to DSS and speak with a Medicaid social worker to get pt's medicaid status changed to behavioral health managed Medicaid and also apply for pt to get PSI involved for referral with ACTT. Once ACTT involved, more support to assist with placement.   She stated that she would continue to check in and assist with pt and reach out for any questions regarding support for pt care.   CSW to reach out to [facility] office and complete referral to ACT team with PSI.     TOC will continue to follow.     Expected Discharge Plan and Services                                               Social Determinants of Health (SDOH) Interventions SDOH Screenings   Food Insecurity: No Food Insecurity (02/04/2023)  Housing: High Risk (02/04/2023)  Transportation Needs: No Transportation Needs (02/04/2023)  Utilities: Not At Risk (02/04/2023)  Tobacco Use: High Risk (02/03/2023)    Readmission Risk Interventions     No data to display

## 2023-02-20 NOTE — Progress Notes (Addendum)
Summary: Russell [name] is a 58 y.o. male with remote history of cocaine use disorder and housing instability presenting with deteriorating mental status and admitted for septic encephalopathy.   Hospital day: 17  Subjective:  Patient seen and evaluated while sitting in chair. Patient reports he is "alright." Endorses eating pancakes and eggs for breakfast and drinking mainly juice and water. Endorses 2-3 BM per day. No complaints.   Objective:  Vital signs in last 24 hours: Vitals:   02/19/23 1603 02/19/23 1944 02/20/23 0756 02/20/23 0957  BP: 128/84 114/73 113/83 113/83  Pulse: 96 (!) 109 96   Resp: 18 18    Temp: 98.4 F (36.9 C) 98.6 F (37 C) (!) 97.1 F (36.2 C)   TempSrc: Oral Oral Oral   SpO2: 99% 100% 100%   Weight:      Height:       Physical Exam Cardiovascular:     Rate and Rhythm: Normal rate and regular rhythm.     Heart sounds: Normal heart sounds.  Pulmonary:     Effort: Pulmonary effort is normal.     Breath sounds: Normal breath sounds.  Skin:    General: Skin is warm and dry.  Neurological:     Mental Status: He is alert.     Comments: Oriented to person and place; not to time or reason for hospitalization   Weight change:   Intake/Output Summary (Last 24 hours) at 02/20/2023 1019 Last data filed at 02/19/2023 1620 Gross per 24 hour  Intake 671 ml  Output 200 ml  Net 471 ml      Latest Ref Rng & Units 02/20/2023    6:56 AM 02/19/2023    3:49 PM 02/19/2023    7:18 AM  BMP  Glucose 70 - 99 mg/dL 79   97   BUN 6 - 20 mg/dL 14   16   Creatinine 9.60 - 1.24 mg/dL 4.54   0.98   Sodium 119 - 145 mmol/L 132  133  125   Potassium 3.5 - 5.1 mmol/L 3.9   3.5   Chloride 98 - 111 mmol/L 107   99   CO2 22 - 32 mmol/L 18   21   Calcium 8.9 - 10.3 mg/dL 8.0   8.1    Serum Na after NS Challenge: 133 Urine Osmol: 636 Urine Na: 27  Assessment/Plan:  Principal Problem:   Pneumococcal bacteremia Active Problems:   Sepsis (HCC)   Fall    Encephalopathy   Community acquired pneumonia   Thrombocytopenia (HCC)   Hematuria   Homelessness   Alcoholism (HCC)   AIDS (HCC)   Positive RPR test   Cholelithiasis without cholecystitis   Aortic atherosclerosis (HCC)   Cerebral atrophy (HCC)   Horseshoe kidney   Malnutrition of moderate degree   HIV dementia (HCC)   HCV (hepatitis C virus)   Pulmonary hypertension (HCC)   Right ventricular systolic dysfunction   Biological false positive RPR test   Onychomycosis   Agitation  Russell [name] is a 58 y.o. male with remote history of cocaine use disorder and housing instability presenting with deteriorating mental status and admitted for septic encephalopathy on HD 17.    Hyponatremia Elevated Creatinine Sodium increased yesterday to 133 from 125 after NS challenge. Sodium 132 today. Urine osmolality resulted greater than 100 and urine sodium resulted between 25-40 after NS challenge yesterday. Suspect hyponatremia is 2/2 to poor PO intake. Creatinine slightly elevated at 1.27 today from 1.18 yesterday. Suspect  this is also from poor oral intake. Anion gap WNL. Asked nursing staff if they can continue encouraging him to take in food and fluids.  - BMP tomorrow AM - Strict I/O   HIV/AIDS and HIV Dementia History of Positive RPR Mentation stable today. Continues to know person and place but not time or reason for hospitalization. Dr. Ninetta Lights contacted representative from [facility] who plans to see patient this afternoon. Appreciate assistance. TOC/SW also following, appreciate assistance. Waiting for SNF placement. - Continue Zyprexa 5 mg in the Am and PM, and 10 mg at bedtime  - Continue Biktarvy for his HIV - Continue bactrim for PJP prophylaxis.  - Delirium precautions  - S/p IM Penicillin 2.4 million units on 1/21; will need additional IM Penicillin 2.4 million units doses on 1/28 and 2/4 for syphilis treatment   HTN  Normotensive overnight and this AM.  - Continue to monitor -  Continue on 5mg  of amlodipine    HCV Early Cirrhosis  Thrombocytopenia Positive HCV with elevated viral load and imaging revealing early cirrhosis. Platelets have been stable. Will need outpatient follow up. Patient reports 2-3 BMs per day, which is within goal. 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  - Continue urea cream and moisturizer    Severely reduced right ventricular systolic fxn Pulmonary HTN Per cardiology, pt is a poor cadidate for RH catheterization and advanced cardiac therapies.    Resolved problems: AKI-resolved.  Severe Sepsis 2/2 to strep bacteremia resolved.    Diet: Normal IVF: None VTE: Xarelto Code: Full PT/OT recs: None, none.   Prior to Admission Living Arrangement: Home Anticipated Discharge Location: SNF or memory facility Barriers to Discharge: Medical Stability   LOS: 17 days   [name], Medical Student 02/20/2023, 10:19 AM

## 2023-02-20 NOTE — Progress Notes (Signed)
Physical Therapy Treatment Patient Details Name: Russell [name] MRN: [id number] DOB: Dec 18, 1965 Today's Date: 02/20/2023   History of Present Illness Pt is a 58 yo male who [facility] ED on [facility]/03/23 with a chief complaint of fall and AMS, Pt with pneumococcal bacteremia and found to be HIV+. Pt seen by psychiatry and found unable to make own decisions.  PMH remote cocaine use and alcohol use.    PT Comments  Tolerated well, however still limited by impaired memory and cognition. Attempted some higher level dynamic gait challenges however is not able to processes and follow commands. Supervision for safety with gait, intermittent stumbling, self correct. Could not find room, requires max cues. Patient will continue to benefit from skilled physical therapy services to further improve independence with functional mobility.     If plan is discharge home, recommend the following: Assistance with cooking/housework;Direct supervision/assist for medications management;Direct supervision/assist for financial management;Assist for transportation;Help with stairs or ramp for entrance;Supervision due to cognitive status;A little help with walking and/or transfers;A little help with bathing/dressing/bathroom   Can travel by private vehicle     Yes  Equipment Recommendations  None recommended by PT    Recommendations for Other Services       Precautions / Restrictions Precautions Precautions: Fall Restrictions Weight Bearing Restrictions Per Provider Order: No     Mobility  Bed Mobility               General bed mobility comments: In recliner    Transfers Overall transfer level: Needs assistance Equipment used: None Transfers: Sit to/from Stand Sit to Stand: Supervision           General transfer comment: Supervision for safety, better control with descent today. No assist needed    Ambulation/Gait Ambulation/Gait assistance: Supervision Gait Distance (Feet): 525  Feet Assistive device: None Gait Pattern/deviations: Step-through pattern, Drifts right/left, Festinating Gait velocity: decr Gait velocity interpretation: <1.8 ft/sec, indicate of risk for recurrent falls   General Gait Details: Supervision for safety, occasionally stumbles forward but able to self correct. Attempted DGI but could not follow commands for nearly any of the challenges. Still drifting a bit, using rail in hallway at times but no physical assist required. Cues throughout to challenge dynamic gait with minimal response; could not find his room without max cues.   Stairs             Wheelchair Mobility     Tilt Bed    Modified Rankin (Stroke Patients Only)       Balance Overall balance assessment: Needs assistance Sitting-balance support: No upper extremity supported, Feet supported Sitting balance-Leahy Scale: Good     Standing balance support: No upper extremity supported, During functional activity Standing balance-Leahy Scale: Fair Standing balance comment: Supervision for safety                            Cognition Arousal: Alert Behavior During Therapy: Impulsive Overall Cognitive Status: No family/caregiver present to determine baseline cognitive functioning                                 General Comments: Could not find room even with verbal and visual cues, walked by it several times saying he's ready to go back tot he room and despite verbalizing understanding.        Exercises      General Comments General  comments (skin integrity, edema, etc.): Needs redirected      Pertinent Vitals/Pain Pain Assessment Pain Assessment: No/denies pain    Home Living                          Prior Function            PT Goals (current goals can now be found in the care plan section) Acute Rehab PT Goals Patient Stated Goal: none stated PT Goal Formulation: Patient unable to participate in goal setting Time  For Goal Achievement: 02/23/23 Potential to Achieve Goals: Fair Progress towards PT goals: Progressing toward goals    Frequency    Min 1X/week      PT Plan      Co-evaluation              AM-PAC PT "6 Clicks" Mobility   Outcome Measure  Help needed turning from your back to your side while in a flat bed without using bedrails?: None Help needed moving from lying on your back to sitting on the side of a flat bed without using bedrails?: A Little Help needed moving to and from a bed to a chair (including a wheelchair)?: A Little Help needed standing up from a chair using your arms (e.g., wheelchair or bedside chair)?: A Little Help needed to walk in hospital room?: A Little Help needed climbing 3-5 steps with a railing? : A Little 6 Click Score: 19    End of Session Equipment Utilized During Treatment: Gait belt Activity Tolerance: Patient tolerated treatment well Patient left: with call bell/phone within reach;in chair;with chair alarm set Nurse Communication: Mobility status PT Visit Diagnosis: Unsteadiness on feet (R26.81);Muscle weakness (generalized) (M62.81);Difficulty in walking, not elsewhere classified (R26.2);Other symptoms and signs involving the nervous system (R29.898)     Time: 0981-1914 PT Time Calculation (min) (ACUTE ONLY): 13 min  Charges:    $Gait Training: 8-22 mins PT General Charges $$ ACUTE PT VISIT: 1 Visit                     Kathlyn Sacramento, PT, DPT [facility]  Rehabilitation Services Physical Therapist Office: [phone] Website: [email or web address]    Berton Mount 02/20/2023, 2:35 PM

## 2023-02-21 LAB — BASIC METABOLIC PANEL
Anion gap: 7 (ref 5–15)
BUN: 15 mg/dL (ref 6–20)
CO2: 20 mmol/L — ABNORMAL LOW (ref 22–32)
Calcium: 8.2 mg/dL — ABNORMAL LOW (ref 8.9–10.3)
Chloride: 105 mmol/L (ref 98–111)
Creatinine, Ser: 1.41 mg/dL — ABNORMAL HIGH (ref 0.61–1.24)
GFR, Estimated: 57 mL/min — ABNORMAL LOW (ref >60)
Glucose, Bld: 80 mg/dL (ref 70–99)
Potassium: 3.7 mmol/L (ref 3.5–5.1)
Sodium: 132 mmol/L — ABNORMAL LOW (ref 135–145)

## 2023-02-21 NOTE — Plan of Care (Signed)

## 2023-02-21 NOTE — Progress Notes (Cosign Needed)
Summary: Russell [name] is a 58 y.o. male with remote history of cocaine use disorder and housing instability presenting with deteriorating mental status and admitted for septic encephalopathy.   Hospital day: 18  Subjective:  Patient seen and evaluated while sitting on side of his hospital bed. He reports he is doing "alright." He denies difficulty breathing or abdominal pain. He endorses having 2 BMs yesterday. He reports he ate some peanut butter and his breakfast this AM. Endorses drinking water, juice and milk. Patient unable to provide the correct answer to his reason for hospitalization. No other complaints.  Objective:  Vital signs in last 24 hours: Vitals:   02/20/23 1558 02/20/23 1958 02/21/23 0424 02/21/23 0917  BP: 114/75 123/77 124/85 119/78  Pulse: 96 94 88 (!) 104  Resp: 18 17 18 18   Temp: (!) 97.3 F (36.3 C) (!) 97.4 F (36.3 C) 98.1 F (36.7 C) 98.4 F (36.9 C)  TempSrc: Oral     SpO2: 99% 100% 100% 100%  Weight:      Height:       Physical Exam Cardiovascular:     Rate and Rhythm: Normal rate and regular rhythm.  Pulmonary:     Effort: Pulmonary effort is normal.     Breath sounds: Normal breath sounds.  Skin:    General: Skin is warm and dry.  Neurological:     Mental Status: He is alert.     Comments: Oriented to person and place, not to time; lacks insight   Weight change:   Intake/Output Summary (Last 24 hours) at 02/21/2023 1309 Last data filed at 02/21/2023 1000 Gross per 24 hour  Intake 437 ml  Output --  Net 437 ml      Latest Ref Rng & Units 02/21/2023    7:37 AM 02/20/2023    6:56 AM 02/19/2023    3:49 PM  BMP  Glucose 70 - 99 mg/dL 80  79    BUN 6 - 20 mg/dL 15  14    Creatinine 1.61 - 1.24 mg/dL 0.96  0.45    Sodium 409 - 145 mmol/L 132  132  133   Potassium 3.5 - 5.1 mmol/L 3.7  3.9    Chloride 98 - 111 mmol/L 105  107    CO2 22 - 32 mmol/L 20  18    Calcium 8.9 - 10.3 mg/dL 8.2  8.0      Assessment/Plan:  Principal  Problem:   Pneumococcal bacteremia Active Problems:   Sepsis (HCC)   Fall   Encephalopathy   Community acquired pneumonia   Thrombocytopenia (HCC)   Hematuria   Homelessness   Alcoholism (HCC)   AIDS (HCC)   Positive RPR test   Cholelithiasis without cholecystitis   Aortic atherosclerosis (HCC)   Cerebral atrophy (HCC)   Horseshoe kidney   Malnutrition of moderate degree   HIV dementia (HCC)   HCV (hepatitis C virus)   Pulmonary hypertension (HCC)   Right ventricular systolic dysfunction   Biological false positive RPR test   Onychomycosis   Agitation  Russell [name] is a 58 y.o. male with remote history of cocaine use disorder and housing instability presenting with deteriorating mental status and admitted for septic encephalopathy on HD 10.    Hyponatremia Elevated Creatinine Sodium stable at 132 today. Creatinine 1.41 today from 1.27 yesterday. Increase of 0.23 over 48 hours. BUN/Creatinine ratio 10.6. Suspect hyponatremia is 2/2 to poor PO intake as patient's sodium increased with NS challenge day before  yesterday. Patient's creatinine has fluctuated during hospitalization. Nursing staff asked to encourage PO intake. Anion gap continues to be WNL.  - BMP tomorrow AM - Strict I/O   HIV/AIDS Concern for HIV Dementia History of Positive RPR Mentation stable today. Per chart review, [employer] spoke with CSW and recommended CSW should reach out to DSS and speak with a Medicaid social worker to get pt's medicaid status changed to behavioral health managed Medicaid and also apply for pt to get PSI involved for referral with ACTT. Once ACTT involved, more support to assist with placement per CSW. TOC/CSW following, appreciate assistance. Waiting for SNF placement. - Continue Zyprexa 5 mg in the Am and PM, and 10 mg at bedtime  - Continue Biktarvy for his HIV - Continue bactrim for PJP prophylaxis.  - Delirium precautions  - S/p IM Penicillin 2.4  million units on 1/21; will need additional IM Penicillin 2.4 million units doses on 1/28 and 2/4 for syphilis treatment   HTN  Normotensive overnight and this AM.  - Continue to monitor - Continue on 5mg  of amlodipine    HCV Early Cirrhosis  Thrombocytopenia Positive HCV with elevated viral load and imaging revealing early cirrhosis. Platelets have been stable. Will need outpatient follow up. Patient reports 2 BMs per day, which is within goal. 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  - Continue urea cream and moisturizer    Severely reduced right ventricular systolic fxn Pulmonary HTN Per cardiology, pt is a poor cadidate for RH catheterization and advanced cardiac therapies.    Resolved problems: AKI-resolved.  Severe Sepsis 2/2 to strep bacteremia resolved.    Diet: Normal IVF: None VTE: Xarelto Code: Full PT/OT recs: None, none.   Prior to Admission Living Arrangement: Home Anticipated Discharge Location: SNF or memory facility Barriers to Discharge: Medical Stability   LOS: 18 days   [name], Medical Student 02/21/2023, 1:09 PM

## 2023-02-22 LAB — BASIC METABOLIC PANEL
Anion gap: 6 (ref 5–15)
BUN: 14 mg/dL (ref 6–20)
CO2: 19 mmol/L — ABNORMAL LOW (ref 22–32)
Calcium: 8.4 mg/dL — ABNORMAL LOW (ref 8.9–10.3)
Chloride: 107 mmol/L (ref 98–111)
Creatinine, Ser: 1.16 mg/dL (ref 0.61–1.24)
GFR, Estimated: >60 mL/min (ref >60)
Glucose, Bld: 86 mg/dL (ref 70–99)
Potassium: 4.2 mmol/L (ref 3.5–5.1)
Sodium: 132 mmol/L — ABNORMAL LOW (ref 135–145)

## 2023-02-22 MED ORDER — ORAL CARE MOUTH RINSE: 15.0000 mL | OROMUCOSAL | Status: DC | PRN

## 2023-02-22 NOTE — Progress Notes (Signed)
Summary: Russell [name] is a 58 y.o. male with remote history of cocaine use disorder and housing instability presenting with deteriorating mental status and admitted for septic encephalopathy.   Hospital day: 2  Subjective:  Patient is hungry and would like [employer].   Objective:  Vital signs in last 24 hours: Vitals:   02/21/23 1748 02/21/23 2051 02/22/23 0803 02/22/23 0938  BP: 119/78 131/83 106/75 106/75  Pulse: 96 (!) 101 96   Resp: 17 18 17    Temp: 98.6 F (37 C) 98.3 F (36.8 C) 97.6 F (36.4 C)   TempSrc: Oral  Oral   SpO2: 99% 100% 99%   Weight:      Height:       Physical Exam Cardiovascular:     Rate and Rhythm: Normal rate and regular rhythm.     Heart sounds: No murmur heard.    No friction rub. No gallop.  Pulmonary:     Effort: Pulmonary effort is normal. No respiratory distress.     Breath sounds: Normal breath sounds. No wheezing, rhonchi or rales.  Abdominal:     General: Abdomen is flat. There is no distension.     Palpations: Abdomen is soft.     Tenderness: There is no abdominal tenderness. There is no guarding or rebound.  Musculoskeletal:        General: No swelling or tenderness.     Right lower leg: No edema.     Left lower leg: No edema.  Skin:    General: Skin is warm and dry.     Comments: Severe icthyosis on the right extremity with coarse scale. No open wounds. Bilateral ingrown and curbed toenails. Onychomycosis.   Neurological:     Mental Status: He is alert.     Comments: Oriented to person and place, not to time; lacks insight   Weight change:   Intake/Output Summary (Last 24 hours) at 02/22/2023 1449 Last data filed at 02/22/2023 1610 Gross per 24 hour  Intake 557 ml  Output --  Net 557 ml      Latest Ref Rng & Units 02/22/2023    7:25 AM 02/21/2023    7:37 AM 02/20/2023    6:56 AM  BMP  Glucose 70 - 99 mg/dL 86  80  79   BUN 6 - 20 mg/dL 14  15  14    Creatinine 0.61 - 1.24 mg/dL 9.60  4.54  0.98   Sodium 135 - 145  mmol/L 132  132  132   Potassium 3.5 - 5.1 mmol/L 4.2  3.7  3.9   Chloride 98 - 111 mmol/L 107  105  107   CO2 22 - 32 mmol/L 19  20  18    Calcium 8.9 - 10.3 mg/dL 8.4  8.2  8.0     Assessment/Plan:  Principal Problem:   Pneumococcal bacteremia Active Problems:   Sepsis (HCC)   Fall   Encephalopathy   Community acquired pneumonia   Thrombocytopenia (HCC)   Hematuria   Homelessness   Alcoholism (HCC)   AIDS (HCC)   Positive RPR test   Cholelithiasis without cholecystitis   Aortic atherosclerosis (HCC)   Cerebral atrophy (HCC)   Horseshoe kidney   Malnutrition of moderate degree   HIV dementia (HCC)   HCV (hepatitis C virus)   Pulmonary hypertension (HCC)   Right ventricular systolic dysfunction   Biological false positive RPR test   Onychomycosis   Agitation  Russell [name] is a 58 y.o. male with  remote history of cocaine use disorder and housing instability presenting with deteriorating mental status and admitted for septic encephalopathy on HD 65.    Hyponatremia Elevated Creatinine Sodium stable at 132 today. Creatinine back to baseline 1.16 today.  - cw Encouragement for PO intake.  - Strict I/Os   HIV/AIDS Concern for HIV Dementia History of Positive RPR Pending discharge, difficult placement for this man who needs assistance, with multiple social determinants impacting his health care. Seeking for HIV resources for help. Appreciate TOC. Will need next penicillin dose on Tuesday. RPR positive with 1:2 titer but negative antibody. Wonder if this this negative because of his HIV.  - Continue Zyprexa 5 mg in the Am and PM, and 10 mg at bedtime  - Continue Biktarvy for his HIV - Continue bactrim for PJP prophylaxis.  - Delirium precautions  - S/p IM Penicillin 2.4 million units on 1/21; will need additional IM Penicillin 2.4 million units doses on 1/28 and 2/4 for syphilis treatment   HTN  Normotensive on 5mg  amlodipine. Cw same.    HCV Early Cirrhosis   Thrombocytopenia Positive HCV with elevated viral load and imaging revealing early cirrhosis. Platelets have been stable. Will need outpatient follow up. Patient reports 2 BMs per day, which is within goal. 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 Most likely unable to care for toes either. Will need debridement at least. - Continue urea cream and moisturizer  -Will need FU with Podiatry outpatient   Severely reduced right ventricular systolic fxn Pulmonary HTN Per cardiology, pt is a poor cadidate for [facility] catheterization and advanced cardiac therapies.    Resolved problems: Severe Sepsis 2/2 to strep bacteremia resolved.    Diet: Normal IVF: None VTE: Xarelto Code: Full PT/OT recs: None, none.   Prior to Admission Living Arrangement: Home Anticipated Discharge Location: SNF or memory facility Barriers to Discharge: Medical Stability   LOS: 19 days   Hassan Rowan, [place], MD 02/22/2023, 2:49 PM

## 2023-02-22 NOTE — Plan of Care (Signed)
  Problem: Education: Goal: Knowledge of General Education information will improve Description: Including pain rating scale, medication(s)/side effects and non-pharmacologic comfort measures Outcome: Progressing   Problem: Health Behavior/Discharge Planning: Goal: Ability to manage health-related needs will improve Outcome: Progressing   Problem: Clinical Measurements: Goal: Ability to maintain clinical measurements within normal limits will improve Outcome: Progressing   Problem: Clinical Measurements: Goal: Ability to maintain clinical measurements within normal limits will improve Outcome: Progressing Goal: Will remain free from infection Outcome: Progressing Goal: Diagnostic test results will improve Outcome: Progressing Goal: Respiratory complications will improve Outcome: Progressing Goal: Cardiovascular complication will be avoided Outcome: Progressing   Problem: Nutrition: Goal: Adequate nutrition will be maintained Outcome: Progressing   Problem: Safety: Goal: Ability to remain free from injury will improve Outcome: Progressing

## 2023-02-22 NOTE — Plan of Care (Signed)

## 2023-02-23 DIAGNOSIS — R7881 Bacteremia: Secondary | ICD-10-CM | POA: Diagnosis not present

## 2023-02-23 DIAGNOSIS — B953 Streptococcus pneumoniae as the cause of diseases classified elsewhere: Secondary | ICD-10-CM | POA: Diagnosis not present

## 2023-02-23 LAB — BASIC METABOLIC PANEL
Anion gap: 7 (ref 5–15)
BUN: 13 mg/dL (ref 6–20)
CO2: 19 mmol/L — ABNORMAL LOW (ref 22–32)
Calcium: 8.4 mg/dL — ABNORMAL LOW (ref 8.9–10.3)
Chloride: 106 mmol/L (ref 98–111)
Creatinine, Ser: 1.17 mg/dL (ref 0.61–1.24)
GFR, Estimated: >60 mL/min (ref >60)
Glucose, Bld: 97 mg/dL (ref 70–99)
Potassium: 4 mmol/L (ref 3.5–5.1)
Sodium: 132 mmol/L — ABNORMAL LOW (ref 135–145)

## 2023-02-23 MED ORDER — CETAPHIL MOISTURIZING EX LOTN
TOPICAL_LOTION | Freq: Three times a day (TID) | CUTANEOUS | Status: DC
  Administered 2023-02-27: 1 via TOPICAL
  Filled 2023-02-23: qty 473

## 2023-02-23 MED ORDER — PENICILLIN G BENZATHINE 1200000 UNIT/2ML IM SUSY
2.4000 10*6.[IU] | PREFILLED_SYRINGE | INTRAMUSCULAR | Status: AC
  Administered 2023-02-24 – 2023-03-03 (×2): 2.4 10*6.[IU] via INTRAMUSCULAR
  Filled 2023-02-23 (×2): qty 4

## 2023-02-23 MED ORDER — UREA 10 % EX LOTN
TOPICAL_LOTION | Freq: Three times a day (TID) | CUTANEOUS | Status: DC
  Filled 2023-02-23: qty 237

## 2023-02-23 NOTE — Progress Notes (Signed)
Pt's syphilis treatment discussed with CDC/STI hotline.  They recommend to complete the 3 dose series of Pen for positive tests.

## 2023-02-23 NOTE — Progress Notes (Signed)
Summary: Russell [name] is a 58 y.o. male with remote history of cocaine use disorder and housing instability presenting with deteriorating mental status and admitted for septic encephalopathy.   Hospital day: 19  Subjective:  Patient sleeping, no concerns. IM Zyprexa, screaming at nurses.  Objective:  Vital signs in last 24 hours: Vitals:   02/22/23 2126 02/23/23 0418 02/23/23 0745 02/23/23 0921  BP: 119/72 (!) 106/55 113/70 113/70  Pulse: (!) 101 96 100   Resp: 19 19 18    Temp: 98 F (36.7 C) 98 F (36.7 C) 98 F (36.7 C)   TempSrc: Oral Oral    SpO2: 100% 99% 100%   Weight:      Height:       Physical Exam Cardiovascular:     Rate and Rhythm: Normal rate and regular rhythm.     Heart sounds: No murmur heard.    No friction rub. No gallop.  Pulmonary:     Effort: Pulmonary effort is normal. No respiratory distress.     Breath sounds: Normal breath sounds. No wheezing, rhonchi or rales.  Abdominal:     General: Abdomen is flat. There is no distension.     Palpations: Abdomen is soft.     Tenderness: There is no abdominal tenderness. There is no guarding or rebound.  Musculoskeletal:        General: No swelling or tenderness.     Right lower leg: No edema.     Left lower leg: No edema.  Skin:    General: Skin is warm and dry.     Comments: Severe icthyosis on the right extremity with coarse scale. No open wounds. Bilateral ingrown and curbed toenails. Onychomycosis.   Neurological:     Mental Status: He is alert.     Comments: Oriented to person and place, not to time; lacks insight   Weight change:   Intake/Output Summary (Last 24 hours) at 02/23/2023 1557 Last data filed at 02/23/2023 1500 Gross per 24 hour  Intake 843 ml  Output 200 ml  Net 643 ml      Latest Ref Rng & Units 02/23/2023    7:03 AM 02/22/2023    7:25 AM 02/21/2023    7:37 AM  BMP  Glucose 70 - 99 mg/dL 97  86  80   BUN 6 - 20 mg/dL 13  14  15    Creatinine 0.61 - 1.24 mg/dL 9.14  7.82   9.56   Sodium 135 - 145 mmol/L 132  132  132   Potassium 3.5 - 5.1 mmol/L 4.0  4.2  3.7   Chloride 98 - 111 mmol/L 106  107  105   CO2 22 - 32 mmol/L 19  19  20    Calcium 8.9 - 10.3 mg/dL 8.4  8.4  8.2     Assessment/Plan:  Principal Problem:   Pneumococcal bacteremia Active Problems:   Sepsis (HCC)   Fall   Encephalopathy   Community acquired pneumonia   Thrombocytopenia (HCC)   Hematuria   Homelessness   Alcoholism (HCC)   AIDS (HCC)   Positive RPR test   Cholelithiasis without cholecystitis   Aortic atherosclerosis (HCC)   Cerebral atrophy (HCC)   Horseshoe kidney   Malnutrition of moderate degree   HIV dementia (HCC)   HCV (hepatitis C virus)   Pulmonary hypertension (HCC)   Right ventricular systolic dysfunction   Biological false positive RPR test   Onychomycosis   Agitation  Russell [name] is a 58  y.o. male with remote history of cocaine use disorder and housing instability presenting with deteriorating mental status and admitted for septic encephalopathy on HD 35.    Hyponatremia Elevated Creatinine Sodium stable at 132 today. Creatinine back to baseline 1.17 today, stable  - cw Encouragement for PO intake.  - Strict I/Os   HIV/AIDS Concern for HIV Dementia History of Positive RPR Stable. Was given IV Zyprexa ON - Dc'ed order.  - Continue Zyprexa 5 mg in the Am and PM, and 10 mg at bedtime  - Continue Biktarvy for his HIV - Continue bactrim for PJP prophylaxis.  - Delirium precautions  - S/p IM Penicillin 2.4 million units on 1/21; will need additional IM Penicillin 2.4 million units doses on 1/28 and 2/4 for syphilis treatment- next dose tomorrow    HTN  Normotensive on 5mg  amlodipine. Cw same.    HCV Early Cirrhosis  Thrombocytopenia NAGMA Positive HCV with elevated viral load and imaging revealing early cirrhosis. Platelets have been stable. Will need outpatient follow up. Patient reports 2 BMs per day, no diarrhea. But confusion likely due to HIV  vs cirrhosis. Will DC lactulose.    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 Most likely unable to care for toes either. Will need debridement at least. - Continue urea cream and moisturizer - increased to TID -Will need FU with Podiatry outpatient   Severely reduced right ventricular systolic fxn Pulmonary HTN Per cardiology, pt is a poor cadidate for [facility] catheterization and advanced cardiac therapies.    Resolved problems: Severe Sepsis 2/2 to strep bacteremia resolved.    Diet: Normal IVF: None VTE: Xarelto Code: Full PT/OT recs: None, none.   Prior to Admission Living Arrangement: Home Anticipated Discharge Location: SNF or memory facility Barriers to Discharge: Medical Stability   LOS: 20 days   Russell [name], [place], MD 02/23/2023, 3:57 PM

## 2023-02-23 NOTE — Plan of Care (Signed)
Problem: Education: Goal: Knowledge of General Education information will improve Description: Including pain rating scale, medication(s)/side effects and non-pharmacologic comfort measures Outcome: Progressing   Problem: Clinical Measurements: Goal: Ability to maintain clinical measurements within normal limits will improve Outcome: Progressing Goal: Will remain free from infection Outcome: Progressing Goal: Diagnostic test results will improve Outcome: Progressing Goal: Respiratory complications will improve Outcome: Progressing Goal: Cardiovascular complication will be avoided Outcome: Progressing   Problem: Activity: Goal: Risk for activity intolerance will decrease Outcome: Progressing   Problem: Nutrition: Goal: Adequate nutrition will be maintained Outcome: Progressing   Problem: Elimination: Goal: Will not experience complications related to bowel motility Outcome: Progressing Goal: Will not experience complications related to urinary retention Outcome: Progressing

## 2023-02-24 DIAGNOSIS — R7881 Bacteremia: Secondary | ICD-10-CM | POA: Diagnosis not present

## 2023-02-24 DIAGNOSIS — B953 Streptococcus pneumoniae as the cause of diseases classified elsewhere: Secondary | ICD-10-CM | POA: Diagnosis not present

## 2023-02-24 NOTE — Progress Notes (Signed)
Summary: Russell [name] is a 58 y.o. male with remote history of cocaine use disorder and housing instability presenting with deteriorating mental status and admitted for septic encephalopathy.   Hospital day: 21  Subjective:  Knows he is in the hospital and he has an infection.   Objective:  Vital signs in last 24 hours: Vitals:   02/23/23 0921 02/23/23 1604 02/24/23 0458 02/24/23 0757  BP: 113/70 (!) 141/84 117/79 137/82  Pulse:  100 88 91  Resp:  18 18 18   Temp:  97.9 F (36.6 C) 98 F (36.7 C) 98.1 F (36.7 C)  TempSrc:      SpO2:  100% 99% 100%  Weight:      Height:       Physical Exam Cardiovascular:     Rate and Rhythm: Normal rate and regular rhythm.     Heart sounds: No murmur heard.    No friction rub. No gallop.  Pulmonary:     Effort: Pulmonary effort is normal. No respiratory distress.     Breath sounds: Normal breath sounds. No wheezing, rhonchi or rales.  Abdominal:     General: Abdomen is flat. There is no distension.     Palpations: Abdomen is soft.     Tenderness: There is no abdominal tenderness. There is no guarding or rebound.  Musculoskeletal:        General: No swelling or tenderness.     Right lower leg: No edema.     Left lower leg: No edema.  Skin:    General: Skin is warm and dry.     Comments: Improved icthyosis.   Neurological:     Mental Status: He is alert.     Comments: Oriented to person and place, not to time; knows he is in the hospital because of an infection.    Weight change:   Intake/Output Summary (Last 24 hours) at 02/24/2023 1350 Last data filed at 02/24/2023 1037 Gross per 24 hour  Intake 603 ml  Output 200 ml  Net 403 ml      Latest Ref Rng & Units 02/23/2023    7:03 AM 02/22/2023    7:25 AM 02/21/2023    7:37 AM  BMP  Glucose 70 - 99 mg/dL 97  86  80   BUN 6 - 20 mg/dL 13  14  15    Creatinine 0.61 - 1.24 mg/dL 1.61  0.96  0.45   Sodium 135 - 145 mmol/L 132  132  132   Potassium 3.5 - 5.1 mmol/L 4.0  4.2  3.7    Chloride 98 - 111 mmol/L 106  107  105   CO2 22 - 32 mmol/L 19  19  20    Calcium 8.9 - 10.3 mg/dL 8.4  8.4  8.2     Assessment/Plan:  Principal Problem:   Pneumococcal bacteremia Active Problems:   Sepsis (HCC)   Fall   Encephalopathy   Community acquired pneumonia   Thrombocytopenia (HCC)   Hematuria   Homelessness   Alcoholism (HCC)   AIDS (HCC)   Positive RPR test   Cholelithiasis without cholecystitis   Aortic atherosclerosis (HCC)   Cerebral atrophy (HCC)   Horseshoe kidney   Malnutrition of moderate degree   HIV dementia (HCC)   HCV (hepatitis C virus)   Pulmonary hypertension (HCC)   Right ventricular systolic dysfunction   Biological false positive RPR test   Onychomycosis   Agitation  Russell [name] is a 58 y.o. male with remote history  of cocaine use disorder and housing instability presenting with deteriorating mental status and admitted for septic encephalopathy on HD 64.     HIV/AIDS Concern for HIV Dementia History of Positive RPR Stable. More alert today as he remembered why he is in the hospital. He also knows it is January but does not know year. Im Penicillin due today. Placement still pending.  - Continue Zyprexa 5 mg in the Am and PM, and 10 mg at bedtime  - Continue Biktarvy for his HIV - Continue bactrim for PJP prophylaxis.  - Delirium precautions  - S/p IM Penicillin 2.4 million units on 1/21 and today ; will need additional IM Penicillin 2.4 million units doses on 2/4 to continue treatment for late latent syphilis.  Hyponatremia Elevated Creatinine Will monitor once weekly. Was stable yesterday.  - cw Encouragement for PO intake.  - Strict I/Os    HTN  Normotensive on 5mg  amlodipine. Cw same.    HCV Early Cirrhosis  Thrombocytopenia NAGMA Positive HCV with elevated viral load and imaging revealing early cirrhosis. 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 Most likely unable to care for toes either. Will  need debridement at least. - Continue urea cream and moisturizer TID -Will need FU with Podiatry outpatient   Severely reduced right ventricular systolic fxn Pulmonary HTN Per cardiology, pt is a poor cadidate for [facility] catheterization and advanced cardiac therapies.    Resolved problems: Severe Sepsis 2/2 to strep bacteremia resolved.    Diet: Normal IVF: None VTE: Xarelto Code: Full PT/OT recs: None, none.   Prior to Admission Living Arrangement: Home Anticipated Discharge Location: SNF or memory facility Barriers to Discharge: Medical Stability   LOS: 21 days   Russell [name], [place], MD 02/24/2023, 1:50 PM

## 2023-02-24 NOTE — Progress Notes (Signed)
Events this Shift: Assigned RN gave patient medication at a earlier time because patient was cooperative and gave Assigned RN permission to give. Patient remains extremely confused and continues to speak about going to work, constant re-direction needed. Will continue to monitor this shift.

## 2023-02-24 NOTE — Progress Notes (Signed)
Patient refusing medications, refusing assessment, and telling Assigned RN " Im not doing nothin, Im going to sleep." Will continue to monitor this shift.

## 2023-02-24 NOTE — Progress Notes (Signed)
PT Cancellation Note  Patient Details Name: Russell [name] MRN: [id number] DOB: 07/29/1965   Cancelled Treatment:    Reason Eval/Treat Not Completed: Patient declined, no reason specified  States he has been up walking around the unit this morning. Might be willing to work with PT later. Will follow up as schedule permits.  Russell [name], PT, [name]  Rehabilitation Services Physical Therapist Office: [phone] Website: [email or web address]  Berton Mount 02/24/2023, 9:52 AM

## 2023-02-24 NOTE — Plan of Care (Signed)

## 2023-02-24 NOTE — Progress Notes (Signed)
PT Cancellation Note  Patient Details Name: Russell [name] MRN: [id number] DOB: May 23, 1965   Cancelled Treatment:    Reason Eval/Treat Not Completed: Patient declined, no reason specified  2nd attempt, pt declines to participate.   Will follow and progress as tolerated.  Kathlyn [place], PT, DPT [facility]  Rehabilitation Services Physical Therapist Office: [phone] Website: [email or web address]   Berton Mount 02/24/2023, 3:31 PM

## 2023-02-25 DIAGNOSIS — B953 Streptococcus pneumoniae as the cause of diseases classified elsewhere: Secondary | ICD-10-CM | POA: Diagnosis not present

## 2023-02-25 DIAGNOSIS — R7881 Bacteremia: Secondary | ICD-10-CM | POA: Diagnosis not present

## 2023-02-25 MED ORDER — MELATONIN 3 MG PO TABS: 3.0000 mg | ORAL_TABLET | Freq: Once | ORAL | Status: DC

## 2023-02-25 MED ORDER — MELATONIN 3 MG PO TABS
3.0000 mg | ORAL_TABLET | Freq: Every evening | ORAL | Status: DC | PRN
  Administered 2023-02-25: 3 mg via ORAL
  Filled 2023-02-25: qty 1

## 2023-02-25 NOTE — Progress Notes (Addendum)
Summary: Russell [name] is a 58 y.o. male with remote history of cocaine use disorder and housing instability presenting with deteriorating mental status and admitted for septic encephalopathy.   Hospital day: 22  Subjective:  He is okay now and was wondering up to 17M. He would like to go to the store and get himself some shoes.   Objective:  Vital signs in last 24 hours: Vitals:   02/24/23 1629 02/24/23 1931 02/25/23 0517 02/25/23 0812  BP: 125/75 129/79 132/81 123/72  Pulse: (!) 103 (!) 102 (!) 109 (!) 104  Resp: 18 18 18    Temp: 97.6 F (36.4 C) 98.5 F (36.9 C) 98.7 F (37.1 C) 98.5 F (36.9 C)  TempSrc:      SpO2: 99% 99% 99% 98%  Weight:      Height:        Physical Exam Cardiovascular:     Rate and Rhythm: Normal rate and regular rhythm.     Heart sounds: No murmur heard.    No friction rub. No gallop.  Pulmonary:     Effort: Pulmonary effort is normal. No respiratory distress.     Breath sounds: Normal breath sounds. No wheezing, rhonchi or rales.  Abdominal:     General: Abdomen is flat. There is no distension.     Palpations: Abdomen is soft.     Tenderness: There is no abdominal tenderness. There is no guarding or rebound.  Musculoskeletal:        General: No swelling or tenderness.     Right lower leg: No edema.     Left lower leg: No edema.  Skin:    General: Skin is warm and dry.     Comments: Improved icthyosis.   Neurological:     Mental Status: He is alert.     Comments: Alert and oriented to self, will know time with assistance, knows he came in because of an infection.   Weight change:   Intake/Output Summary (Last 24 hours) at 02/25/2023 0844 Last data filed at 02/24/2023 1037 Gross per 24 hour  Intake 3 ml  Output --  Net 3 ml      Latest Ref Rng & Units 02/23/2023    7:03 AM 02/22/2023    7:25 AM 02/21/2023    7:37 AM  BMP  Glucose 70 - 99 mg/dL 97  86  80   BUN 6 - 20 mg/dL 13  14  15    Creatinine 0.61 - 1.24 mg/dL 1.61  0.96   0.45   Sodium 135 - 145 mmol/L 132  132  132   Potassium 3.5 - 5.1 mmol/L 4.0  4.2  3.7   Chloride 98 - 111 mmol/L 106  107  105   CO2 22 - 32 mmol/L 19  19  20    Calcium 8.9 - 10.3 mg/dL 8.4  8.4  8.2     Assessment/Plan:  Principal Problem:   Pneumococcal bacteremia Active Problems:   Sepsis (HCC)   Fall   Encephalopathy   Community acquired pneumonia   Thrombocytopenia (HCC)   Hematuria   Homelessness   Alcoholism (HCC)   AIDS (HCC)   Positive RPR test   Cholelithiasis without cholecystitis   Aortic atherosclerosis (HCC)   Cerebral atrophy (HCC)   Horseshoe kidney   Malnutrition of moderate degree   HIV dementia (HCC)   HCV (hepatitis C virus)   Pulmonary hypertension (HCC)   Right ventricular systolic dysfunction   Biological false positive RPR test  Onychomycosis   Agitation  Russell [name] is a 58 y.o. male with remote history of cocaine use disorder and housing instability presenting with deteriorating mental status and admitted for septic encephalopathy.   HIV/AIDS Concern for HIV Dementia History of Positive RPR Mentation is better today. Knows he is in the hospital because of an infection. Will know it is January with assistance. Still disoriented to year.  Pending placement. Appreciate TOC.  - Continue Zyprexa 5 mg in the Am and PM, and 10 mg at bedtime  - Continue Biktarvy for his HIV - Continue bactrim for PJP prophylaxis.  - Delirium precautions  - S/p IM Penicillin 2.4 million units on 1/21 and 1/28 ; will need additional IM Penicillin 2.4 million units doses on 2/4 to continue treatment for late latent syphilis.  Hyponatremia Elevated Creatinine Will monitor once weekly.  Patient has been drinking water.  - cw Encouragement for PO intake.  - Strict I/Os    HTN  Normotensive on 5mg  amlodipine. Cw same.    HCV Early Cirrhosis  Thrombocytopenia NAGMA Positive HCV with elevated viral load and imaging revealing early cirrhosis. 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 Most likely unable to care for toes either. Will need debridement at least. - Continue urea cream and moisturizer TID -Will need FU with Podiatry outpatient   Severely reduced right ventricular systolic fxn Pulmonary HTN Per cardiology, pt is a poor cadidate for [facility] catheterization and advanced cardiac therapies.    Resolved problems: Severe Sepsis 2/2 to strep bacteremia resolved.    Diet: Normal IVF: None VTE: Xarelto Code: Full PT/OT recs: None, none.   Prior to Admission Living Arrangement: Home Anticipated Discharge Location: SNF or memory facility Barriers to Discharge: Medical Stability   LOS: 22 days   Hassan Rowan, [place], MD 02/25/2023, 8:44 AM

## 2023-02-25 NOTE — Progress Notes (Signed)
Notified On Call Provider regarding patients behavior this shift. Patient is presenting hyper fixated on going to work, walking hallways, coming out of the room multiple times. Assigned RN has to re-direct multiple upon multiple times. Assigned RN notified on call Provider to obtain instruction/orders to help patient to settle for the evening. On Call Provider on the unit @ this time, speaking with patient. Patient remains calm, no agitated noted. Will continue to monitor this shift.

## 2023-02-25 NOTE — Plan of Care (Signed)

## 2023-02-25 NOTE — Plan of Care (Signed)
Pt does not have an IV access. PIV removed per MD's order.   Problem: Education: Goal: Knowledge of General Education information will improve Description: Including pain rating scale, medication(s)/side effects and non-pharmacologic comfort measures Outcome: Progressing   Problem: Health Behavior/Discharge Planning: Goal: Ability to manage health-related needs will improve Outcome: Progressing   Problem: Clinical Measurements: Goal: Ability to maintain clinical measurements within normal limits will improve Outcome: Progressing Goal: Will remain free from infection Outcome: Progressing Goal: Diagnostic test results will improve Outcome: Progressing Goal: Respiratory complications will improve Outcome: Progressing Goal: Cardiovascular complication will be avoided Outcome: Progressing   Problem: Activity: Goal: Risk for activity intolerance will decrease Outcome: Progressing   Problem: Nutrition: Goal: Adequate nutrition will be maintained Outcome: Progressing   Problem: Coping: Goal: Level of anxiety will decrease Outcome: Progressing   Problem: Elimination: Goal: Will not experience complications related to bowel motility Outcome: Progressing Goal: Will not experience complications related to urinary retention Outcome: Progressing   Problem: Pain Management: Goal: General experience of comfort will improve Outcome: Progressing   Problem: Safety: Goal: Ability to remain free from injury will improve Outcome: Progressing   Problem: Skin Integrity: Goal: Risk for impaired skin integrity will decrease Outcome: Progressing

## 2023-02-26 DIAGNOSIS — F0280 Dementia in other diseases classified elsewhere without behavioral disturbance: Secondary | ICD-10-CM | POA: Diagnosis not present

## 2023-02-26 DIAGNOSIS — B20 Human immunodeficiency virus [HIV] disease: Secondary | ICD-10-CM | POA: Diagnosis not present

## 2023-02-26 NOTE — Progress Notes (Signed)
Events this shift: Patient refused to have Assigned RN place cream on bilateral feet. Will continue to monitor this shift.

## 2023-02-26 NOTE — Plan of Care (Signed)

## 2023-02-26 NOTE — Progress Notes (Signed)
Summary: Russell [name] is a 58 y.o. male with remote history of cocaine use disorder and housing instability presenting with deteriorating mental status and admitted for septic encephalopathy.  Subjective:  A medication he took made him feel sleepy.   Objective:  Vital signs in last 24 hours: Vitals:   02/25/23 1238 02/25/23 1922 02/26/23 0657 02/26/23 0805  BP: 136/87 135/84 129/78 119/80  Pulse: 93 (!) 104 90 94  Resp: 16 18 16 18   Temp: 97.8 F (36.6 C)  98.3 F (36.8 C) 97.8 F (36.6 C)  TempSrc: Oral   Oral  SpO2: 100% 100% 100% 100%  Weight:      Height:        Physical Exam Cardiovascular:     Rate and Rhythm: Normal rate and regular rhythm.     Heart sounds: No murmur heard.    No friction rub. No gallop.  Pulmonary:     Effort: Pulmonary effort is normal. No respiratory distress.     Breath sounds: Normal breath sounds. No wheezing, rhonchi or rales.  Abdominal:     General: Abdomen is flat. There is no distension.     Palpations: Abdomen is soft.     Tenderness: There is no abdominal tenderness. There is no guarding or rebound.  Musculoskeletal:        General: No swelling or tenderness.     Right lower leg: No edema.     Left lower leg: No edema.  Skin:    General: Skin is warm and dry.     Comments: Improved icthyosis.   Neurological:     Mental Status: He is alert.     Comments: Alert and oriented to self, will know time with assistance, knows he came in because of an infection.   Weight change:   Intake/Output Summary (Last 24 hours) at 02/26/2023 1424 Last data filed at 02/26/2023 1127 Gross per 24 hour  Intake 441 ml  Output --  Net 441 ml      Latest Ref Rng & Units 02/23/2023    7:03 AM 02/22/2023    7:25 AM 02/21/2023    7:37 AM  BMP  Glucose 70 - 99 mg/dL 97  86  80   BUN 6 - 20 mg/dL 13  14  15    Creatinine 0.61 - 1.24 mg/dL 1.61  0.96  0.45   Sodium 135 - 145 mmol/L 132  132  132   Potassium 3.5 - 5.1 mmol/L 4.0  4.2  3.7    Chloride 98 - 111 mmol/L 106  107  105   CO2 22 - 32 mmol/L 19  19  20    Calcium 8.9 - 10.3 mg/dL 8.4  8.4  8.2     Assessment/Plan:  Principal Problem:   Pneumococcal bacteremia Active Problems:   Sepsis (HCC)   Fall   Encephalopathy   Community acquired pneumonia   Thrombocytopenia (HCC)   Hematuria   Homelessness   Alcoholism (HCC)   AIDS (HCC)   Positive RPR test   Cholelithiasis without cholecystitis   Aortic atherosclerosis (HCC)   Cerebral atrophy (HCC)   Horseshoe kidney   Malnutrition of moderate degree   HIV dementia (HCC)   HCV (hepatitis C virus)   Pulmonary hypertension (HCC)   Right ventricular systolic dysfunction   Biological false positive RPR test   Onychomycosis   Agitation  Russell [name] is a 58 y.o. male with remote history of cocaine use disorder and housing instability presenting with  deteriorating mental status and admitted for septic encephalopathy.   HIV/AIDS Concern for HIV Dementia History of Positive RPR His mentation is improving. Melatonin was given yesterday to help him sleep but he did not like the way it made him feel, will DC for today. Pending placement. Appreciate TOC.  - Continue Zyprexa 5 mg in the Am and PM, and 10 mg at bedtime  - Continue Biktarvy for his HIV - Continue bactrim for PJP prophylaxis.  - Delirium precautions  - S/p IM Penicillin 2.4 million units on 1/21 and 1/28 ; will need additional IM Penicillin 2.4 million units doses on 2/4 to continue treatment for late latent syphilis. -assistance with feeding -tele monitor  Hyponatremia Elevated Creatinine Will monitor once weekly.  Patient has been drinking water.  - cw Encouragement for PO intake.  - Strict I/Os    HTN  Normotensive on 5mg  amlodipine. Cw same.    HCV Early Cirrhosis  Thrombocytopenia NAGMA Positive HCV with elevated viral load and imaging revealing early cirrhosis. 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 Most  likely unable to care for toes either. Will need debridement at least. - Continue urea cream and moisturizer TID -Will need FU with Podiatry outpatient   Severely reduced right ventricular systolic fxn Pulmonary HTN Per cardiology, pt is a poor cadidate for [facility] catheterization and advanced cardiac therapies.    Resolved problems: Severe Sepsis 2/2 to strep bacteremia resolved.    Diet: Normal IVF: None VTE: Xarelto Code: Full PT/OT recs: None, none.   Prior to Admission Living Arrangement: Home Anticipated Discharge Location: SNF or memory facility Barriers to Discharge: Medical Stability   LOS: 23 days   Russell [name], [place], MD 02/26/2023, 2:24 PM

## 2023-02-27 DIAGNOSIS — R7881 Bacteremia: Secondary | ICD-10-CM | POA: Diagnosis not present

## 2023-02-27 DIAGNOSIS — F0280 Dementia in other diseases classified elsewhere without behavioral disturbance: Secondary | ICD-10-CM | POA: Diagnosis not present

## 2023-02-27 DIAGNOSIS — B953 Streptococcus pneumoniae as the cause of diseases classified elsewhere: Secondary | ICD-10-CM | POA: Diagnosis not present

## 2023-02-27 DIAGNOSIS — B20 Human immunodeficiency virus [HIV] disease: Secondary | ICD-10-CM | POA: Diagnosis not present

## 2023-02-27 NOTE — Progress Notes (Signed)
Physical Therapy Treatment and Discharge Patient Details Name: Russell [name] MRN: [id number] DOB: [birth year] Today's Date: 02/27/2023   History of Present Illness Pt is a 58 yo male who presented to [facility] ED on 02/03/23 with a chief complaint of fall and AMS, Pt with pneumococcal bacteremia and found to be HIV+. Pt seen by psychiatry and found unable to make own decisions.  PMH remote cocaine use and alcohol use.    PT Comments  Patient discharged from PT services secondary to goals being adequately met and no further acute PT needs identified. Mobilizing at a mod-I to independent level now. Transfers and ambulates without assistive device, evidence of overt LOB, or need for physical assistance. Able to stand at sink and perform oral care and other hygiene without assisted. Reviewed safety, POC (d/c), and encouraged to continue mobilizing to prevent complications associated with immobility. Recommend supervision for safety due to cognitive deficits at d/c.   If plan is discharge home, recommend the following: Direct supervision/assist for medications management;Direct supervision/assist for financial management;Assist for transportation;Supervision due to cognitive status   Can travel by private vehicle     Yes  Equipment Recommendations  None recommended by PT    Recommendations for Other Services       Precautions / Restrictions Precautions Precautions: Fall Restrictions Weight Bearing Restrictions Per Provider Order: No     Mobility  Bed Mobility Overal bed mobility: Independent                  Transfers Overall transfer level: Modified independent                 General transfer comment: No assist needed    Ambulation/Gait Ambulation/Gait assistance: Modified independent (Device/Increase time) Gait Distance (Feet): 50 Feet (Ambulating in hallway earlier, unassisted when PT came by.) Assistive device: None Gait Pattern/deviations: Step-through pattern Gait  velocity: decr     General Gait Details: Mobilizing throughout room, goes to sink to brush teeth, cleaning up spill on floor. No assistive device used. easily distracted but easily redirected. Declines to ambulate outside of room but he was observed ambulating throughout hallways, carrying a bag earlier without signs of LOB.   Stairs             Wheelchair Mobility     Tilt Bed    Modified Rankin (Stroke Patients Only)       Balance Overall balance assessment: Needs assistance Sitting-balance support: No upper extremity supported, Feet supported Sitting balance-Leahy Scale: Good     Standing balance support: No upper extremity supported, During functional activity Standing balance-Leahy Scale: Good                              Cognition Arousal: Alert Behavior During Therapy: WFL for tasks assessed/performed Overall Cognitive Status: No family/caregiver present to determine baseline cognitive functioning                                 General Comments: Easily redirected, also easily agitated.        Exercises      General Comments General comments (skin integrity, edema, etc.): Discussed self-care, hygiene, safety awareness, and updated POC. Pt stood at sink and able to perform oral care etc safely without physical intervention.      Pertinent Vitals/Pain Pain Assessment Pain Assessment: No/denies pain    Home Living  Prior Function            PT Goals (current goals can now be found in the care plan section) Acute Rehab PT Goals Patient Stated Goal: none stated PT Goal Formulation: Patient unable to participate in goal setting Time For Goal Achievement: 02/23/23 Potential to Achieve Goals: Good Progress towards PT goals: Goals met/education completed, patient discharged from PT    Frequency    Min 1X/week      PT Plan      Co-evaluation              AM-PAC PT "6  Clicks" Mobility   Outcome Measure  Help needed turning from your back to your side while in a flat bed without using bedrails?: None Help needed moving from lying on your back to sitting on the side of a flat bed without using bedrails?: None Help needed moving to and from a bed to a chair (including a wheelchair)?: None Help needed standing up from a chair using your arms (e.g., wheelchair or bedside chair)?: None Help needed to walk in hospital room?: None Help needed climbing 3-5 steps with a railing? : None 6 Click Score: 24    End of Session   Activity Tolerance: Patient tolerated treatment well Patient left: in chair;with call bell/phone within reach Nurse Communication: Mobility status PT Visit Diagnosis: Unsteadiness on feet (R26.81);Muscle weakness (generalized) (M62.81);Difficulty in walking, not elsewhere classified (R26.2);Other symptoms and signs involving the nervous system (R29.898)     Time: 2952-8413 PT Time Calculation (min) (ACUTE ONLY): 15 min  Charges:    $Self Care/Home Management: 8-22 PT General Charges $$ ACUTE PT VISIT: 1 Visit                     Kathlyn [place], PT, DPT [facility]  Rehabilitation Services Physical Therapist Office: [phone] Website: [email or web address]    Berton Mount 02/27/2023, 4:36 PM

## 2023-02-27 NOTE — Plan of Care (Signed)

## 2023-02-27 NOTE — Progress Notes (Signed)
Summary: Russell [name] is a 58 y.o. male with remote history of cocaine use disorder and housing instability presenting with deteriorating mental status and admitted for septic encephalopathy.  Subjective:  "I am alright".   Objective:  Vital signs in last 24 hours: Vitals:   02/26/23 0805 02/26/23 1633 02/26/23 1937 02/27/23 0814  BP: 119/80 119/74 130/75 112/74  Pulse: 94 97 100 91  Resp: 18 18 18    Temp: 97.8 F (36.6 C) 97.9 F (36.6 C) 98.6 F (37 C) 97.7 F (36.5 C)  TempSrc: Oral Oral Oral Oral  SpO2: 100% 99% 100% 100%  Weight:      Height:        Physical Exam Cardiovascular:     Rate and Rhythm: Normal rate and regular rhythm.  Pulmonary:     Effort: No tachypnea, accessory muscle usage, respiratory distress or retractions.     Breath sounds: Normal air entry.  Abdominal:     General: Abdomen is flat. There is no distension.     Palpations: Abdomen is soft.     Tenderness: There is no abdominal tenderness.  Musculoskeletal:        General: No swelling or tenderness.  Skin:    General: Skin is warm and dry.  Neurological:     Mental Status: He is alert.     Comments: Alert and oriented to self, will know it is January with assistance. Not oriented to year. Thinks he is at a friend's house.    Weight change:   Intake/Output Summary (Last 24 hours) at 02/27/2023 1049 Last data filed at 02/27/2023 0858 Gross per 24 hour  Intake 243 ml  Output --  Net 243 ml      Latest Ref Rng & Units 02/23/2023    7:03 AM 02/22/2023    7:25 AM 02/21/2023    7:37 AM  BMP  Glucose 70 - 99 mg/dL 97  86  80   BUN 6 - 20 mg/dL 13  14  15    Creatinine 0.61 - 1.24 mg/dL 6.57  8.46  9.62   Sodium 135 - 145 mmol/L 132  132  132   Potassium 3.5 - 5.1 mmol/L 4.0  4.2  3.7   Chloride 98 - 111 mmol/L 106  107  105   CO2 22 - 32 mmol/L 19  19  20    Calcium 8.9 - 10.3 mg/dL 8.4  8.4  8.2     Assessment/Plan:  Principal Problem:   Pneumococcal bacteremia Active Problems:    Sepsis (HCC)   Fall   Encephalopathy   Community acquired pneumonia   Thrombocytopenia (HCC)   Hematuria   Homelessness   Alcoholism (HCC)   AIDS (HCC)   Positive RPR test   Cholelithiasis without cholecystitis   Aortic atherosclerosis (HCC)   Cerebral atrophy (HCC)   Horseshoe kidney   Malnutrition of moderate degree   HIV dementia (HCC)   HCV (hepatitis C virus)   Pulmonary hypertension (HCC)   Right ventricular systolic dysfunction   Biological false positive RPR test   Onychomycosis   Agitation  Russell [name] is a 58 y.o. male with remote history of cocaine use disorder and housing instability presenting with deteriorating mental status and admitted for septic encephalopathy.   HIV/AIDS Concern for HIV Dementia History of Positive RPR He was wondering last night but he is easily redirectable and has been able to stay in his room with a sitter. He is more confused today, thinks he is  at a Friend's house. Still does not know what year it is. Cannot tell us why he is in the hospital. No insight into disease. Unable to care for himself. - Continue Zyprexa 5 mg in the Am and PM, and 10 mg at bedtime  - Continue Biktarvy for his HIV - Continue bactrim for PJP prophylaxis.  - Delirium precautions  - S/p IM Penicillin 2.4 million units on 1/21 and 1/28 ; will need additional IM Penicillin 2.4 million units doses on 2/4 to continue treatment for late latent syphilis. -assistance with feeding -sitter  Hyponatremia Elevated Creatinine Will monitor once weekly.  Patient has been drinking water.  - cw Encouragement for PO intake.  - Strict I/Os    HTN  Normotensive on 5mg  amlodipine. Cw same.    HCV Early Cirrhosis  Thrombocytopenia NAGMA Positive HCV with elevated viral load and imaging revealing early cirrhosis. 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 Most likely unable to care for toes either. Will need debridement at least. - Continue urea  cream and moisturizer TID -Will need FU with Podiatry outpatient   Severely reduced right ventricular systolic fxn Pulmonary HTN Per cardiology, pt is a poor cadidate for [facility] catheterization and advanced cardiac therapies.    Resolved problems: Severe Sepsis 2/2 to strep bacteremia resolved.    Diet: Normal IVF: None VTE: Xarelto Code: Full PT/OT recs: None   Prior to Admission Living Arrangement: Home Anticipated Discharge Location: SNF or memory facility Barriers to Discharge: SNF or memory facility placement   LOS: 24 days   Hassan Rowan, [place], MD 02/27/2023, 10:49 AM

## 2023-02-28 DIAGNOSIS — B953 Streptococcus pneumoniae as the cause of diseases classified elsewhere: Secondary | ICD-10-CM | POA: Diagnosis not present

## 2023-02-28 DIAGNOSIS — A528 Late syphilis, latent: Secondary | ICD-10-CM | POA: Insufficient documentation

## 2023-02-28 DIAGNOSIS — R7881 Bacteremia: Secondary | ICD-10-CM | POA: Diagnosis not present

## 2023-02-28 NOTE — Progress Notes (Addendum)
Summary: Russell [name] is a 58 y.o. male prior history of substance use disorder, housing instability, admitted for acute encephalopathy secondary to sepsis on chronic HIV dementia, currently awaiting placement and undergoing treatment for late latent syphilis, stable  Subjective: Feeling okay but wishes to be discharged soon. Denies pain, n/v. Slept well.   Objective:  Vital signs in last 24 hours: Vitals:   02/27/23 0814 02/27/23 1128 02/27/23 2039 02/28/23 0437  BP: 112/74 118/76 139/78 116/78  Pulse: 91  100 93  Resp:   18 18  Temp: 97.7 F (36.5 C)  98.4 F (36.9 C) 98.2 F (36.8 C)  TempSrc: Oral  Oral Oral  SpO2: 100%  100% 100%  Weight:      Height:       General: Pleasant, well-appearing male  CV: RRR. No murmurs, rubs, or gallops. No LE edema Pulmonary: Lungs CTAB Abdominal: Soft, nontender, nondistended. Normal bowel sounds. Extremities: Palpable radial and DP pulses. Normal ROM. Skin: Warm and dry.  Neuro: A&Ox self, location. Moving all extremities Psych: Normal mood and affect  Weight change:   Intake/Output Summary (Last 24 hours) at 02/28/2023 0949 Last data filed at 02/27/2023 1800 Gross per 24 hour  Intake 620 ml  Output --  Net 620 ml      Latest Ref Rng & Units 02/23/2023    7:03 AM 02/22/2023    7:25 AM 02/21/2023    7:37 AM  BMP  Glucose 70 - 99 mg/dL 97  86  80   BUN 6 - 20 mg/dL 13  14  15    Creatinine 0.61 - 1.24 mg/dL 5.40  9.81  1.91   Sodium 135 - 145 mmol/L 132  132  132   Potassium 3.5 - 5.1 mmol/L 4.0  4.2  3.7   Chloride 98 - 111 mmol/L 106  107  105   CO2 22 - 32 mmol/L 19  19  20    Calcium 8.9 - 10.3 mg/dL 8.4  8.4  8.2     Assessment/Plan:  Principal Problem:   Pneumococcal bacteremia Active Problems:   Sepsis (HCC)   Fall   Encephalopathy   Community acquired pneumonia   Thrombocytopenia (HCC)   Hematuria   Homelessness   Alcoholism (HCC)   AIDS (HCC)   Positive RPR test   Cholelithiasis without cholecystitis    Aortic atherosclerosis (HCC)   Cerebral atrophy (HCC)   Horseshoe kidney   Malnutrition of moderate degree   HIV dementia (HCC)   HCV (hepatitis C virus)   Pulmonary hypertension (HCC)   Right ventricular systolic dysfunction   Biological false positive RPR test   Onychomycosis   Agitation   Late latent syphilis  HIV/AIDS Concern for HIV Dementia History of Positive RPR No changes overnight. Patient with poor insight into his disease.Unable to care for himself. Otherwise unchanged and waiting for placement.  - Continue Zyprexa 5 mg in the Am and PM, and 10 mg at bedtime  - Continue Biktarvy for his HIV - Continue bactrim for PJP prophylaxis.  - Delirium precautions  - S/p IM Penicillin 2.4 million units on 1/21 and 1/28 ; IM Penicillin 2.4 million units doses on 2/4 to continue treatment for late latent syphilis. -assistance with feeding -sitter  Hyponatremia Elevated Creatinine Will monitor once weekly.  Patient has been drinking water.  - cw Encouragement for PO intake.  - Strict I/Os    HTN  Stable on 5mg  amlodipine. Cw same.    HCV Early Cirrhosis  Thrombocytopenia NAGMA Positive HCV with elevated viral load and imaging revealing early cirrhosis.  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  - Continue urea cream and moisturizer TID -Will need FU with Podiatry outpatient   Severely reduced right ventricular systolic fxn Pulmonary HTN Per cardiology, pt is a poor cadidate for [facility] catheterization and advanced cardiac therapies.     Diet: Normal IVF: None VTE: Xarelto Code: Full PT/OT recs: None   Prior to Admission Living Arrangement: Home Anticipated Discharge Location: SNF or memory facility Barriers to Discharge: SNF or memory facility placement   LOS: 25 days   Morene Crocker, MD 02/28/2023, 9:49 AM

## 2023-03-01 DIAGNOSIS — R7881 Bacteremia: Secondary | ICD-10-CM | POA: Diagnosis not present

## 2023-03-01 DIAGNOSIS — B953 Streptococcus pneumoniae as the cause of diseases classified elsewhere: Secondary | ICD-10-CM | POA: Diagnosis not present

## 2023-03-01 NOTE — Progress Notes (Signed)
Summary: Russell [name] is a 58 y.o. male prior history of substance use disorder, housing instability, admitted for acute encephalopathy secondary to sepsis on chronic HIV dementia, currently awaiting placement and undergoing treatment for late latent syphilis, stable  Subjective:  Is tired of being here. Walks with nursing but he would like to go outside. Finally got clothing but only has one set.   Objective:  Vital signs in last 24 hours: Vitals:   02/28/23 0437 02/28/23 1928 03/01/23 0414 03/01/23 0838  BP: 116/78 102/86 113/74 126/83  Pulse: 93 89 97 83  Resp: 18 18 18 16   Temp: 98.2 F (36.8 C) 98.2 F (36.8 C) 98.3 F (36.8 C) 98 F (36.7 C)  TempSrc: Oral Oral Oral Oral  SpO2: 100% 99% 100% 99%  Weight:      Height:       General: Pleasant, well-appearing male  CV: RRR. No murmurs, rubs, or gallops. No LE edema Pulmonary: Lungs CTAB Abdominal: Soft, nontender, nondistended. Normal bowel sounds. Extremities: Palpable radial and DP pulses. Normal ROM. Skin: Warm and dry.  Neuro: A&Ox self, location, and month but not to year. Knows he is here because of an infection if given assistance when asking about the motive. Moving all extremities Psych: Normal mood and affect   Weight change:  No intake or output data in the 24 hours ending 03/01/23 0859     Latest Ref Rng & Units 02/23/2023    7:03 AM 02/22/2023    7:25 AM 02/21/2023    7:37 AM  BMP  Glucose 70 - 99 mg/dL 97  86  80   BUN 6 - 20 mg/dL 13  14  15    Creatinine 0.61 - 1.24 mg/dL 1.61  0.96  0.45   Sodium 135 - 145 mmol/L 132  132  132   Potassium 3.5 - 5.1 mmol/L 4.0  4.2  3.7   Chloride 98 - 111 mmol/L 106  107  105   CO2 22 - 32 mmol/L 19  19  20    Calcium 8.9 - 10.3 mg/dL 8.4  8.4  8.2     Assessment/Plan:  Principal Problem:   Pneumococcal bacteremia Active Problems:   Sepsis (HCC)   Fall   Encephalopathy   Community acquired pneumonia   Thrombocytopenia (HCC)   Hematuria   Homelessness    Alcoholism (HCC)   AIDS (HCC)   Positive RPR test   Cholelithiasis without cholecystitis   Aortic atherosclerosis (HCC)   Cerebral atrophy (HCC)   Horseshoe kidney   Malnutrition of moderate degree   HIV dementia (HCC)   HCV (hepatitis C virus)   Pulmonary hypertension (HCC)   Right ventricular systolic dysfunction   Biological false positive RPR test   Onychomycosis   Agitation   Late latent syphilis  HIV/AIDS Concern for HIV Dementia History of Positive RPR Unable to take care for himself, needs supervision with feeding and drinking, ambulating, self care but easily redirectable. Only family left is sister who works all day. He was homeless prior to this. Parents are deceased. Today he knows he is here for an infection but lacks insight into disease. Appreciate TOC with assistance with placement and help obtaining clothing for this patient. He has been in the hospital for 26 days and has only one set of clothes now.  - Continue Zyprexa 5 mg in the Am and PM, and 10 mg at bedtime  - Continue Biktarvy for his HIV - Continue bactrim for PJP prophylaxis.  -  Delirium precautions  - S/p IM Penicillin 2.4 million units on 1/21 and 1/28 ; IM Penicillin 2.4 million units doses on 2/4 to continue treatment for late latent syphilis. -supervision with feeding, drinking, ambulating, self care -sitter  Hyponatremia Elevated Creatinine Will monitor once weekly.  Patient has been drinking water.  - cw Encouragement for PO intake.  - Strict I/Os    HTN  Stable on 5mg  amlodipine. Cw same.    HCV Early Cirrhosis  Thrombocytopenia NAGMA Positive HCV with elevated viral load and imaging revealing early cirrhosis.  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  - Continue urea cream and moisturizer TID -Will need FU with Podiatry outpatient   Severely reduced right ventricular systolic fxn Pulmonary HTN Per cardiology, pt is a poor cadidate for  RH catheterization and advanced cardiac therapies.    Diet: Normal IVF: None VTE: Xarelto Code: Full PT/OT recs: None   Prior to Admission Living Arrangement: Home Anticipated Discharge Location: SNF or memory facility Barriers to Discharge: SNF or memory facility placement   LOS: 26 days   Hassan Rowan, [place], MD 03/01/2023, 8:59 AM

## 2023-03-01 NOTE — Plan of Care (Signed)

## 2023-03-02 LAB — IRON AND TIBC
Iron: 65 ug/dL (ref 45–182)
Saturation Ratios: 25 % (ref 17.9–39.5)
TIBC: 258 ug/dL (ref 250–450)
UIBC: 193 ug/dL

## 2023-03-02 LAB — RENAL FUNCTION PANEL
Albumin: 1.8 g/dL — ABNORMAL LOW (ref 3.5–5.0)
Anion gap: 6 (ref 5–15)
BUN: 13 mg/dL (ref 6–20)
CO2: 19 mmol/L — ABNORMAL LOW (ref 22–32)
Calcium: 8 mg/dL — ABNORMAL LOW (ref 8.9–10.3)
Chloride: 108 mmol/L (ref 98–111)
Creatinine, Ser: 0.96 mg/dL (ref 0.61–1.24)
GFR, Estimated: >60 mL/min (ref >60)
Glucose, Bld: 100 mg/dL — ABNORMAL HIGH (ref 70–99)
Phosphorus: 3 mg/dL (ref 2.5–4.6)
Potassium: 3.7 mmol/L (ref 3.5–5.1)
Sodium: 133 mmol/L — ABNORMAL LOW (ref 135–145)

## 2023-03-02 LAB — CBC
HCT: 32.9 % — ABNORMAL LOW (ref 39.0–52.0)
Hemoglobin: 11.3 g/dL — ABNORMAL LOW (ref 13.0–17.0)
MCH: 34.3 pg — ABNORMAL HIGH (ref 26.0–34.0)
MCHC: 34.3 g/dL (ref 30.0–36.0)
MCV: 100 fL (ref 80.0–100.0)
Platelets: 100 10*3/uL — ABNORMAL LOW (ref 150–400)
RBC: 3.29 MIL/uL — ABNORMAL LOW (ref 4.22–5.81)
RDW: 15.6 % — ABNORMAL HIGH (ref 11.5–15.5)
WBC: 3.1 10*3/uL — ABNORMAL LOW (ref 4.0–10.5)
nRBC: 0 % (ref 0.0–0.2)

## 2023-03-02 LAB — MAGNESIUM: Magnesium: 1.8 mg/dL (ref 1.7–2.4)

## 2023-03-02 LAB — VITAMIN B12: Vitamin B-12: 534 pg/mL (ref 180–914)

## 2023-03-02 LAB — FERRITIN: Ferritin: 744 ng/mL — ABNORMAL HIGH (ref 24–336)

## 2023-03-02 NOTE — Plan of Care (Signed)
  Problem: Pain Management: Goal: General experience of comfort will improve Outcome: Progressing   Problem: Safety: Goal: Ability to remain free from injury will improve Outcome: Progressing   Problem: Skin Integrity: Goal: Risk for impaired skin integrity will decrease Outcome: Progressing

## 2023-03-02 NOTE — Progress Notes (Signed)
Summary: Russell [name] is a 58 y.o. male prior history of substance use disorder, housing instability, admitted for acute encephalopathy secondary to sepsis on chronic HIV dementia, currently awaiting placement and undergoing treatment for late latent syphilis, stable  Subjective:  Had a good night. Slept well. Wonders why we come to ask him questions every day.   Objective:  Vital signs in last 24 hours: Vitals:   03/01/23 0414 03/01/23 0838 03/01/23 1717 03/02/23 0421  BP: 113/74 126/83 128/86 127/83  Pulse: 97 83 91 88  Resp: 18 16 16 18   Temp: 98.3 F (36.8 C) 98 F (36.7 C) 97.9 F (36.6 C) 97.9 F (36.6 C)  TempSrc: Oral Oral Oral Oral  SpO2: 100% 99% 100% 100%  Weight:      Height:       General: Pleasant, well-appearing male  CV: RRR. No murmurs, rubs, or gallops. No LE edema Pulmonary: Lungs CTAB Abdominal: Soft, nontender, nondistended. Normal bowel sounds. Extremities: Palpable radial and DP pulses. Normal ROM. Skin: Warm and dry.  Neuro: A&Ox self and location but not to date. Knows he is here because he had an infection Psych: Normal mood and affect   Weight change:   Intake/Output Summary (Last 24 hours) at 03/02/2023 1059 Last data filed at 03/01/2023 2030 Gross per 24 hour  Intake 240 ml  Output --  Net 240 ml       Latest Ref Rng & Units 03/02/2023    6:32 AM 02/23/2023    7:03 AM 02/22/2023    7:25 AM  BMP  Glucose 70 - 99 mg/dL 161  97  86   BUN 6 - 20 mg/dL 13  13  14    Creatinine 0.61 - 1.24 mg/dL 0.96  0.45  4.09   Sodium 135 - 145 mmol/L 133  132  132   Potassium 3.5 - 5.1 mmol/L 3.7  4.0  4.2   Chloride 98 - 111 mmol/L 108  106  107   CO2 22 - 32 mmol/L 19  19  19    Calcium 8.9 - 10.3 mg/dL 8.0  8.4  8.4     Assessment/Plan:  Principal Problem:   Pneumococcal bacteremia Active Problems:   Sepsis (HCC)   Fall   Encephalopathy   Community acquired pneumonia   Thrombocytopenia (HCC)   Hematuria   Homelessness   Alcoholism (HCC)    AIDS (HCC)   Positive RPR test   Cholelithiasis without cholecystitis   Aortic atherosclerosis (HCC)   Cerebral atrophy (HCC)   Horseshoe kidney   Malnutrition of moderate degree   HIV dementia (HCC)   HCV (hepatitis C virus)   Pulmonary hypertension (HCC)   Right ventricular systolic dysfunction   Biological false positive RPR test   Onychomycosis   Agitation   Late latent syphilis  HIV/AIDS Concern for HIV Dementia History of Positive RPR TOC working on placement for this patient, may use LOG. Application for disability pending.   - Continue Zyprexa 5 mg in the Am and PM, and 10 mg at bedtime  - Continue Biktarvy for his HIV - Continue bactrim for PJP prophylaxis.  - Delirium precautions  - S/p IM Penicillin 2.4 million units on 1/21 and 1/28 ; Tomorrow is the last dose to be treated for latent syphilis.  -B12 WNL.  -supervision with feeding, drinking, ambulating, self care -sitter  Hyponatremia Elevated Creatinine- resolved Still hyponatremic but does not have an AKI. Cr at 0.96, now at baseline.  - cw Encouragement for  PO intake.  - Strict I/Os    HTN  Stable on 5mg  amlodipine. Cw same.    HCV Early Cirrhosis  Thrombocytopenia NAGMA Positive HCV with elevated viral load and imaging revealing early cirrhosis. Thrombocytopenia stable today.  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  - Continue urea cream and moisturizer TID -Will need FU with Podiatry outpatient   Severely reduced right ventricular systolic fxn Pulmonary HTN Per cardiology, pt is a poor cadidate for [facility] catheterization and advanced cardiac therapies.   Anemia  Hgb is 11.3 today.  MCV is 100. Is on Folate supplementation, may be related to HIV. Iron labs not done. Will add-on to labs from today.  -Iron panel pending   Diet: Normal IVF: None VTE: Xarelto Code: Full PT/OT recs: None   Prior to Admission Living Arrangement: Home Anticipated  Discharge Location: SNF or memory facility Barriers to Discharge: SNF or memory facility placement   LOS: 27 days   Hassan Rowan, [place], MD 03/02/2023, 10:59 AM

## 2023-03-02 NOTE — TOC Progression Note (Signed)
Transition of Care [facility]) - Progression Note    Patient Details  Name: Russell [name] MRN: [id number] Date of Birth: 10/09/1965  Transition [facility] Care [facility]) CM/SW Contact  Janae Bridgeman, RN Phone Number: 03/02/2023, 4:20 PM  Clinical Narrative:    Case management called and spoke with Doristine Mango - [facility] with [facility] [place] and he has no male bed availability at this time.    [facility] declined bed offer.  I called and left a message with Deeann Dowse [facility] to check about bed availability.  No bed offers at this time and patient has Medicaid but needs approved disability for payor source.        Expected Discharge Plan and Services                                               Social Determinants of Health (SDOH) Interventions SDOH Screenings   Food Insecurity: No Food Insecurity (02/04/2023)  Housing: High Risk (02/04/2023)  Transportation Needs: No Transportation Needs (02/04/2023)  Utilities: Not At Risk (02/04/2023)  Tobacco Use: High Risk (02/03/2023)    Readmission Risk Interventions     No data to display

## 2023-03-02 NOTE — TOC Progression Note (Signed)
Transition of Care [facility]) - Progression Note    Patient Details  Name: Russell [name] MRN: [id number] Date of Birth: July 07, 1965  Transition of Care [facility]) CM/SW Contact  Ranny Wiebelhaus A [place], [place] Phone Number: 03/02/2023, 10:41 AM  Clinical Narrative:     CSW re-faxed pt out to facility of [facility] for possible placement and memory care unit. TOC leadership made aware of pt, possible LOG to assist with placement funding source need. CSW continue to reach out to facilities for find placement for pt.       TOC will continue to follow.   Expected Discharge Plan and Services                                               Social Determinants of Health (SDOH) Interventions SDOH Screenings   Food Insecurity: No Food Insecurity (02/04/2023)  Housing: High Risk (02/04/2023)  Transportation Needs: No Transportation Needs (02/04/2023)  Utilities: Not At Risk (02/04/2023)  Tobacco Use: High Risk (02/03/2023)    Readmission Risk Interventions     No data to display

## 2023-03-03 DIAGNOSIS — R7881 Bacteremia: Secondary | ICD-10-CM | POA: Diagnosis not present

## 2023-03-03 DIAGNOSIS — B953 Streptococcus pneumoniae as the cause of diseases classified elsewhere: Secondary | ICD-10-CM | POA: Diagnosis not present

## 2023-03-03 DIAGNOSIS — B20 Human immunodeficiency virus [HIV] disease: Secondary | ICD-10-CM | POA: Diagnosis not present

## 2023-03-03 DIAGNOSIS — F0280 Dementia in other diseases classified elsewhere without behavioral disturbance: Secondary | ICD-10-CM | POA: Diagnosis not present

## 2023-03-03 MED ORDER — OLANZAPINE 5 MG PO TBDP
5.0000 mg | ORAL_TABLET | Freq: Two times a day (BID) | ORAL | Status: DC
Start: 2023-03-04 — End: 2023-03-05
  Administered 2023-03-04 (×2): 5 mg via ORAL
  Filled 2023-03-03 (×2): qty 1

## 2023-03-03 NOTE — Progress Notes (Signed)
 Summary: Russell [name] is a 58 y.o. male prior history of substance use disorder, housing instability, admitted for acute encephalopathy secondary to sepsis on chronic HIV dementia, currently awaiting placement and undergoing treatment for late latent syphilis, stable  Subjective:  He is tired of seeing us  every day. Got a T-shirt yesterday.   Objective:  Vital signs in last 24 hours: Vitals:   03/02/23 0814 03/02/23 2119 03/03/23 0300 03/03/23 0442  BP: (!) 141/85 129/80 132/83 (!) 121/95  Pulse: 99 97 98 100  Resp:  18 18 19   Temp: 98.8 F (37.1 C)  98.1 F (36.7 C) 98 F (36.7 C)  TempSrc:   Oral Oral  SpO2: 99% 100% 100% 100%  Weight:      Height:       General: Pleasant, well-appearing male  CV: RRR. No murmurs, rubs, or gallops. No LE edema Pulmonary: Lungs CTAB Abdominal: Soft, nontender, nondistended. Normal bowel sounds. Extremities: Palpable radial and DP pulses. Normal ROM. Skin: Warm and dry.  Neuro: A&Ox self and location but not to date. Knows he is here because he had an infection Psych: Normal mood and affect   Weight change:  No intake or output data in the 24 hours ending 03/03/23 0758      Latest Ref Rng & Units 03/02/2023    6:32 AM 02/23/2023    7:03 AM 02/22/2023    7:25 AM  BMP  Glucose 70 - 99 mg/dL 899  97  86   BUN 6 - 20 mg/dL 13  13  14    Creatinine 0.61 - 1.24 mg/dL 9.03  8.82  8.83   Sodium 135 - 145 mmol/L 133  132  132   Potassium 3.5 - 5.1 mmol/L 3.7  4.0  4.2   Chloride 98 - 111 mmol/L 108  106  107   CO2 22 - 32 mmol/L 19  19  19    Calcium  8.9 - 10.3 mg/dL 8.0  8.4  8.4     Assessment/Plan:  Principal Problem:   Pneumococcal bacteremia Active Problems:   Sepsis (HCC)   Fall   Encephalopathy   Community acquired pneumonia   Thrombocytopenia (HCC)   Hematuria   Homelessness   Alcoholism (HCC)   AIDS (HCC)   Positive RPR test   Cholelithiasis without cholecystitis   Aortic atherosclerosis (HCC)   Cerebral atrophy  (HCC)   Horseshoe kidney   Malnutrition of moderate degree   HIV dementia (HCC)   HCV (hepatitis C virus)   Pulmonary hypertension (HCC)   Right ventricular systolic dysfunction   Biological false positive RPR test   Onychomycosis   Agitation   Late latent syphilis  HIV/AIDS Concern for HIV Dementia History of Positive RPR Penicillin  last dose today. TOC working on placement.   - Continue Zyprexa  5 mg in the Am and PM, and 10 mg at bedtime  - Continue Biktarvy  for his HIV - Continue bactrim  for PJP prophylaxis.  - Delirium precautions  - last dose of penicillin  due today  -supervision with feeding, drinking, ambulating, self care -sitter  Hyponatremia Elevated Creatinine- resolved Will need to encourage PO intake.  - cw Encouragement for PO intake.  - Strict I/Os    HTN  Stable on 5mg  amlodipine . Cw same.    HCV Early Cirrhosis  Thrombocytopenia NAGMA Positive HCV with elevated viral load and imaging revealing early cirrhosis. Thrombocytopenia stable today.  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  - Continue urea  cream  and moisturizer TID -Will need FU with Podiatry outpatient   Severely reduced right ventricular systolic fxn Pulmonary HTN Per cardiology, pt is a poor cadidate for [facility] catheterization and advanced cardiac therapies.   Anemia of Chronic Disease  Ferritin in the 700s. Will continue to monitor. Asymptomatic.  Diet: Normal IVF: None VTE: Xarelto  Code: Full PT/OT recs: None   Prior to Admission Living Arrangement: Home Anticipated Discharge Location: SNF or memory facility Barriers to Discharge: SNF or memory facility placement   LOS: 28 days   [place], [place], MD 03/03/2023, 7:58 AM

## 2023-03-03 NOTE — Plan of Care (Signed)

## 2023-03-03 NOTE — TOC Progression Note (Addendum)
 Transition of Care [facility]) - Progression Note    Patient Details  Name: Russell [name] MRN: [id number] Date of Birth: 1965-06-03  Transition of Care [facility]) CM/SW Contact  Rosaline JONELLE Joe, RN Phone Number: 03/03/2023, 11:52 AM  Clinical Narrative:    CM called and left a detailed message with Renay Presser, owner of [facility] to discuss patient's needs and explore options for safe disposition.  At this time - patient is unable to return home with the sister and needs 24 supervision.  Patient is waiting on safe disposition and currently does not have a payor source.  Financial counselor is following the patient for Long Term disability.  Hospital leadership is aware of barriers and patient remains inpatient waiting on safe disposition plans.  03/03/23 1338 - CM spoke with Leisa, CM with [facility] that provides 24 hour supervision at the facility.  The facility is not a locked facility and the MD Team is aware.  Patient has been re-directible on the unit during admission at this time.  Clinicals were faxed to the facility for review.  HOspital leadership is aware that the facility would require LOG for coverage for room and board since the patient has no payor source at this time and financial counseling is following the patient for needed disability approval.  Leisa, CM with [facility] plans to meet with the patient at the bedside to evaluation and potential bed offer.        Expected Discharge Plan and Services                                               Social Determinants of Health (SDOH) Interventions SDOH Screenings   Food Insecurity: No Food Insecurity (02/04/2023)  Housing: High Risk (02/04/2023)  Transportation Needs: No Transportation Needs (02/04/2023)  Utilities: Not At Risk (02/04/2023)  Tobacco Use: High Risk (02/03/2023)    Readmission Risk Interventions     No data to display

## 2023-03-03 NOTE — Plan of Care (Signed)
  Problem: Elimination: Goal: Will not experience complications related to bowel motility Outcome: Progressing   Problem: Elimination: Goal: Will not experience complications related to urinary retention Outcome: Progressing   Problem: Pain Management: Goal: General experience of comfort will improve Outcome: Progressing

## 2023-03-04 DIAGNOSIS — B20 Human immunodeficiency virus [HIV] disease: Secondary | ICD-10-CM | POA: Diagnosis not present

## 2023-03-04 DIAGNOSIS — F0280 Dementia in other diseases classified elsewhere without behavioral disturbance: Secondary | ICD-10-CM | POA: Diagnosis not present

## 2023-03-04 NOTE — Progress Notes (Signed)
 Summary: Russell [name] is a 58 y.o. male prior history of substance use disorder, housing instability, admitted for acute encephalopathy secondary to sepsis on chronic HIV dementia, currently awaiting placement and undergoing treatment for late latent syphilis, stable  Subjective:  Would like to sleep a little bit more today.   Objective:  Vital signs in last 24 hours: Vitals:   03/03/23 1456 03/03/23 2038 03/04/23 0449 03/04/23 0834  BP: 123/79 (!) 153/94 (!) 132/90 117/67  Pulse: 99 97 96 93  Resp:  18 18 18   Temp: 98.9 F (37.2 C) 98.2 F (36.8 C) 98.4 F (36.9 C) 98.4 F (36.9 C)  TempSrc: Oral Oral Oral Oral  SpO2: 100% 99% 97% 98%  Weight:      Height:       General: Pleasant, well-appearing male  CV: RRR. No murmurs, rubs, or gallops. No LE edema Pulmonary: Lungs CTAB Abdominal: Soft, nontender, nondistended. Normal bowel sounds. Extremities: Palpable radial and DP pulses. Normal ROM. Skin: Warm and dry.  Neuro: A&Ox self and location but not to date.  Psych: Normal mood and affect   Weight change:  No intake or output data in the 24 hours ending 03/04/23 1422      Latest Ref Rng & Units 03/02/2023    6:32 AM 02/23/2023    7:03 AM 02/22/2023    7:25 AM  BMP  Glucose 70 - 99 mg/dL 899  97  86   BUN 6 - 20 mg/dL 13  13  14    Creatinine 0.61 - 1.24 mg/dL 9.03  8.82  8.83   Sodium 135 - 145 mmol/L 133  132  132   Potassium 3.5 - 5.1 mmol/L 3.7  4.0  4.2   Chloride 98 - 111 mmol/L 108  106  107   CO2 22 - 32 mmol/L 19  19  19    Calcium  8.9 - 10.3 mg/dL 8.0  8.4  8.4     Assessment/Plan:  Principal Problem:   Pneumococcal bacteremia Active Problems:   Sepsis (HCC)   Fall   Encephalopathy   Community acquired pneumonia   Thrombocytopenia (HCC)   Hematuria   Homelessness   Alcoholism (HCC)   AIDS (HCC)   Positive RPR test   Cholelithiasis without cholecystitis   Aortic atherosclerosis (HCC)   Cerebral atrophy (HCC)   Horseshoe kidney    Malnutrition of moderate degree   HIV dementia (HCC)   HCV (hepatitis C virus)   Pulmonary hypertension (HCC)   Right ventricular systolic dysfunction   Biological false positive RPR test   Onychomycosis   Agitation   Late latent syphilis  HIV/AIDS Concern for HIV Dementia History of Positive RPR TOC working on placement. Pt easily redirectable. Sitter removed yesterday, has not wondered.  - Continue Zyprexa  5 mg in the Am and PM, and 10 mg at bedtime  - Continue Biktarvy  for his HIV - Continue bactrim  for PJP prophylaxis.  - Delirium precautions  - last dose of penicillin  due today  -supervision with feeding, drinking, ambulating, self care  Hyponatremia Elevated Creatinine- resolved Will need to encourage PO intake.  - cw Encouragement for PO intake.  - Strict I/Os    HTN  Stable on 5mg  amlodipine . Cw same.    HCV Early Cirrhosis  Thrombocytopenia NAGMA Positive HCV with elevated viral load and imaging revealing early cirrhosis. Thrombocytopenia stable today.  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  - Continue urea  cream and moisturizer TID -Will need  FU with Podiatry outpatient   Severely reduced right ventricular systolic fxn Pulmonary HTN Per cardiology, pt is a poor cadidate for [facility] catheterization and advanced cardiac therapies.   Anemia of Chronic Disease  Ferritin in the 700s. Will continue to monitor. Asymptomatic.  Diet: Normal IVF: None VTE: Xarelto  Code: Full PT/OT recs: None   Prior to Admission Living Arrangement: Temp housing with sister Anticipated Discharge Location: SNF or memory facility Barriers to Discharge: SNF or memory facility placement   LOS: 29 days   Vol[place], [place], MD 03/04/2023, 2:22 PM

## 2023-03-04 NOTE — TOC Progression Note (Addendum)
 Transition of Care [facility]) - Progression Note    Patient Details  Name: Russell [name] MRN: [id number] Date of Birth: Nov 14, 1965  [facility] Care [facility]) CM/SW Contact  Rosaline JONELLE Joe, RN Phone Number: 03/04/2023, 2:08 PM  Clinical Narrative:    CM spoke with Leisa, CM this morning around 10 am and she is unable to do evaluation at the bedside today for possible bed offer and schedule the bedside evaluation for tomorrow around 11 am.  Bedside nursing and MD staff are aware.  Latest PT notes and medication list faxed to [facility] for review today since not included in the clinicals that were sent yesterday - fax # [phone].        Expected Discharge Plan and Services                                               Social Determinants of Health (SDOH) Interventions SDOH Screenings   Food Insecurity: No Food Insecurity (02/04/2023)  Housing: High Risk (02/04/2023)  Transportation Needs: No Transportation Needs (02/04/2023)  Utilities: Not At Risk (02/04/2023)  Tobacco Use: High Risk (02/03/2023)    Readmission Risk Interventions     No data to display

## 2023-03-04 NOTE — Plan of Care (Signed)
  Problem: Health Behavior/Discharge Planning: Goal: Ability to manage health-related needs will improve Outcome: Progressing   Problem: Clinical Measurements: Goal: Ability to maintain clinical measurements within normal limits will improve Outcome: Progressing Goal: Respiratory complications will improve Outcome: Progressing   Problem: Activity: Goal: Risk for activity intolerance will decrease Outcome: Progressing   Problem: Coping: Goal: Level of anxiety will decrease Outcome: Progressing   Problem: Elimination: Goal: Will not experience complications related to bowel motility Outcome: Progressing Goal: Will not experience complications related to urinary retention Outcome: Progressing

## 2023-03-04 NOTE — Plan of Care (Signed)

## 2023-03-05 DIAGNOSIS — B953 Streptococcus pneumoniae as the cause of diseases classified elsewhere: Secondary | ICD-10-CM | POA: Diagnosis not present

## 2023-03-05 DIAGNOSIS — R7881 Bacteremia: Secondary | ICD-10-CM | POA: Diagnosis not present

## 2023-03-05 MED ORDER — OLANZAPINE 5 MG PO TBDP
5.0000 mg | ORAL_TABLET | Freq: Two times a day (BID) | ORAL | Status: DC
  Administered 2023-03-05 – 2023-04-03 (×58): 5 mg via ORAL
  Filled 2023-03-05 (×62): qty 1

## 2023-03-05 NOTE — TOC Progression Note (Signed)
 Transition of Care [facility]) - Progression Note    Patient Details  Name: Russell [name] MRN: [id number] Date of Birth: 30-Sep-1965  Transition of Care [facility]) CM/SW Contact  Rosaline JONELLE Joe, RN Phone Number: 03/05/2023, 4:09 PM  Clinical Narrative:    CM spoke with Leisa, CM with [facility] and she has offered a possible bed at the facility once financial arrangements are made and patient's insurance provider has been changed to [facility] Medicaid.  I called and spoke with Thressa Gull, MSW with Infectious disease who will be following the patient in the community and asked for assistance with changing patient's managed plan to [facility] Medicaid so that ALF could enroll patient in [facility] program once he has been admitted to the ALF for care.  [facility] provides 24 hour supervision that patient requires with his HIV Dementia.  JOrdan, MSW is aware and both CM and MSW along with hospital leadership will be coordinating a safe discharge plan.          Expected Discharge Plan and Services                                               Social Determinants of Health (SDOH) Interventions SDOH Screenings   Food Insecurity: No Food Insecurity (02/04/2023)  Housing: High Risk (02/04/2023)  Transportation Needs: No Transportation Needs (02/04/2023)  Utilities: Not At Risk (02/04/2023)  Tobacco Use: High Risk (02/03/2023)    Readmission Risk Interventions     No data to display

## 2023-03-05 NOTE — TOC Progression Note (Signed)
 Transition of Care [facility]) - Progression Note    Patient Details  Name: Russell [name] MRN: [id number] Date of Birth: [birth year]  Transition of Care [facility]) CM/SW Contact  Jamaris Theard A Tinya Cadogan, LCSW Phone Number: 03/05/2023, 1:12 PM  Clinical Narrative:     [facility] team met with [name] from [facility] following her interview with pt for possible facility placement.   She stated that she thought pt could be a good fit for the long term care assisted living side of the facility as they have 24/7 wake supervision for pt.   She requested that pt's insurance be changed to [facility] state Medicaid as they are not in network with any managed Medicaid plans. CSW to reach out to appropriate parties to make [facility] insurance adjustment.   Additionally, discussed LOG for payment to facility until pt's disability application has been approved. TOC leadership to assist with LOG details and provide confirmation of funding source before acceptance and discharge.   Tentative discharge to [facility] pending [employer] and funding source finalized. Estimated date to be determined.   TOC will continue to follow.        Expected Discharge Plan and Services                                               Social Determinants of Health (SDOH) Interventions SDOH Screenings   Food Insecurity: No Food Insecurity (02/04/2023)  Housing: High Risk (02/04/2023)  Transportation Needs: No Transportation Needs (02/04/2023)  Utilities: Not At Risk (02/04/2023)  Tobacco Use: High Risk (02/03/2023)    Readmission Risk Interventions     No data to display

## 2023-03-05 NOTE — Plan of Care (Signed)

## 2023-03-05 NOTE — [place] FL2 (Signed)
 [place]  MEDICAID [id number] LEVEL OF CARE FORM     IDENTIFICATION  Patient Name: NASER SCHULD Birthdate: 02/23/1965 Sex: male Admission Date (Current Location): 02/03/2023  [facility] and [place] Number:  Producer, Television/film/video and Address:  [facility] [place], 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   Agitation 02/14/2023   Onychomycosis 02/10/2023   HCV (hepatitis C virus) 02/06/2023   Pulmonary hypertension (HCC) 02/06/2023   Right ventricular systolic dysfunction 02/06/2023   Biological false positive RPR test 02/06/2023   Malnutrition of moderate degree 02/05/2023   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   Homelessness 02/04/2023   Alcoholism (HCC) 02/04/2023   AIDS (HCC) 02/04/2023   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/2023   Pneumococcal bacteremia 02/04/2023   Cholelithiasis without cholecystitis 02/04/2023   Aortic atherosclerosis (HCC) 02/04/2023   Cerebral atrophy (HCC) 02/04/2023   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   Sepsis (HCC) 02/03/2023   Fall 02/03/2023   Encephalopathy 02/03/2023   Community acquired pneumonia 02/03/2023   Thrombocytopenia (HCC) 02/03/2023   Hematuria 02/03/2023   Closed dislocation of right ankle 06/03/2018    Orientation RESPIRATION BLADDER Height & Weight     Self  Normal Continent Weight: 57.8 kg Height:  5' 6 (167.6 cm)  BEHAVIORAL SYMPTOMS/MOOD NEUROLOGICAL BOWEL NUTRITION STATUS      Continent Diet (See Discharge Summary)  AMBULATORY STATUS  COMMUNICATION OF NEEDS Skin   Independent Verbally Normal                       Personal Care Assistance Level of Assistance  Bathing, Feeding, Dressing Bathing Assistance: Limited assistance Feeding assistance: Limited assistance Dressing Assistance: Limited assistance     Functional Limitations Info  Sight, Hearing, Speech Sight Info: Adequate Hearing Info: Adequate Speech Info: Adequate    SPECIAL CARE FACTORS FREQUENCY  PT (By licensed PT), OT (By licensed OT)     PT Frequency: not applicable OT Frequency: not applicable            Contractures Contractures Info: Not present    Additional Factors Info  Allergies, Code Status, Psychotropic Code Status Info: Full Code Allergies Info: NKDA Psychotropic Info: Zyprexa          Current Medications (03/05/2023):  This is the current hospital active medication list Current Facility-Administered Medications  Medication Dose Route Frequency Provider Last Rate Last Admin   amLODipine  (NORVASC ) tablet 5 mg  5 mg Oral Daily Bender, Emily, DO   5 mg at 03/05/23 0912   bictegravir-emtricitabine -tenofovir  AF (BIKTARVY ) 50-200-25 MG per tablet 1 tablet  1 tablet Oral Daily Fleeta Rothman, Jomarie SAILOR, MD   1 tablet at 03/05/23 0912   cetaphil lotion   Topical TID Alexander-Savino, [place], MD   Given at 03/05/23 0913   feeding supplement (ENSURE ENLIVE / ENSURE PLUS) liquid 237 mL  237 mL Oral BID BM Rosan Dayton C, DO   237 mL at 03/03/23 0908   folic acid  (FOLVITE ) tablet 1 mg  1 mg  Oral Daily McLendon, Michael, MD   1 mg at 03/05/23 9087   multivitamin with minerals tablet 1 tablet  1 tablet Oral Daily McLendon, Michael, MD   1 tablet at 03/04/23 2003   OLANZapine  zydis (ZYPREXA ) disintegrating tablet 10 mg  10 mg Oral QHS Lenard Calin, MD   10 mg at 03/04/23 2003   OLANZapine  zydis (ZYPREXA ) disintegrating tablet 5 mg  5 mg Oral BID Gomez-Caraballo, Maria, MD   5 mg at 03/05/23 1432   Oral care mouth rinse  15 mL Mouth Rinse  PRN Trudy Mliss Dragon, MD       rivaroxaban  (XARELTO ) tablet 10 mg  10 mg Oral Daily Khan, Ghalib, MD   10 mg at 03/05/23 0912   rosuvastatin  (CRESTOR ) tablet 5 mg  5 mg Oral Daily Kandis Perkins, DO   5 mg at 03/05/23 9087   senna (SENOKOT) tablet 8.6 mg  1 tablet Oral Daily Alexander-Savino, [place], MD   8.6 mg at 03/05/23 9087   sodium chloride  flush (NS) 0.9 % injection 3 mL  3 mL Intravenous Q12H Norrine Sharper, MD   3 mL at 02/26/23 1130   sodium chloride  flush (NS) 0.9 % injection 3 mL  3 mL Intravenous PRN McLendon, Michael, MD       sulfamethoxazole -trimethoprim  (BACTRIM  DS) 800-160 MG per tablet 1 tablet  1 tablet Oral Daily Kandis Perkins, DO   1 tablet at 03/05/23 9087   urea  10 % lotion   Topical TID Alexander-Savino, [place], MD   Given at 03/05/23 0913     Discharge Medications: Please see discharge summary for a list of discharge medications.  Relevant Imaging Results:  Relevant Lab Results:   Additional Information SSN: [id number]  Rosaline JONELLE Joe, RN

## 2023-03-05 NOTE — Progress Notes (Signed)
 Mobility Specialist Progress Note:    03/05/23 1519  Mobility  Activity Ambulated with assistance in hallway  Level of Assistance Standby assist, set-up cues, supervision of patient - no hands on  Assistive Device None  Distance Ambulated (ft) 100 ft  Activity Response Tolerated well  Mobility Referral Yes  Mobility visit 1 Mobility  Mobility Specialist Start Time (ACUTE ONLY) 1508  Mobility Specialist Stop Time (ACUTE ONLY) 1518  Mobility Specialist Time Calculation (min) (ACUTE ONLY) 10 min   Pt received ambulating in halls disoriented, unable to find his room. MS helped redirection pt back to room. No c/o throughout. No physical assistance needed. Returned to room w/o fault. All needs met.   Thersia Minder Mobility Specialist  Please contact vis Secure Chat or  Rehab Office [phone]

## 2023-03-05 NOTE — Progress Notes (Addendum)
 Summary: LEWELLYN FULTZ is a 58 y.o. male prior history of substance use disorder, housing instability, admitted for acute encephalopathy secondary to sepsis on chronic HIV dementia, currently awaiting placement.  Subjective:  Doing great today. Talking with some friends, you know while he is sitting in the chair enjoying a conversation with nursing staff.   Objective:  Vital signs in last 24 hours: Vitals:   03/04/23 1615 03/04/23 2149 03/05/23 0349 03/05/23 0800  BP: 122/79 137/82 137/86 128/78  Pulse: 95 97 91 89  Resp: 18 18 18 18   Temp: (!) 97.3 F (36.3 C) 99.1 F (37.3 C) 97.6 F (36.4 C) 98.5 F (36.9 C)  TempSrc: Oral Oral Oral Oral  SpO2: 98% 99% 98% 99%  Weight:      Height:       General: Pleasant, well-appearing male  CV: RRR. No murmurs, rubs, or gallops. No LE edema Pulmonary: Lungs CTAB Abdominal: Soft, nontender, nondistended. Normal bowel sounds. Extremities: Palpable radial and DP pulses. Normal ROM. Skin: Warm and dry.  Neuro: A&Ox self and location but not to date.  Psych: Normal mood and affect   Weight change:   Intake/Output Summary (Last 24 hours) at 03/05/2023 1415 Last data filed at 03/05/2023 0913 Gross per 24 hour  Intake 476 ml  Output --  Net 476 ml        Latest Ref Rng & Units 03/02/2023    6:32 AM 02/23/2023    7:03 AM 02/22/2023    7:25 AM  BMP  Glucose 70 - 99 mg/dL 899  97  86   BUN 6 - 20 mg/dL 13  13  14    Creatinine 0.61 - 1.24 mg/dL 9.03  8.82  8.83   Sodium 135 - 145 mmol/L 133  132  132   Potassium 3.5 - 5.1 mmol/L 3.7  4.0  4.2   Chloride 98 - 111 mmol/L 108  106  107   CO2 22 - 32 mmol/L 19  19  19    Calcium  8.9 - 10.3 mg/dL 8.0  8.4  8.4     Assessment/Plan:  Principal Problem:   Pneumococcal bacteremia Active Problems:   Sepsis (HCC)   Fall   Encephalopathy   Community acquired pneumonia   Thrombocytopenia (HCC)   Hematuria   Homelessness   Alcoholism (HCC)   AIDS (HCC)   Positive RPR test    Cholelithiasis without cholecystitis   Aortic atherosclerosis (HCC)   Cerebral atrophy (HCC)   Horseshoe kidney   Malnutrition of moderate degree   HIV dementia (HCC)   HCV (hepatitis C virus)   Pulmonary hypertension (HCC)   Right ventricular systolic dysfunction   Biological false positive RPR test   Onychomycosis   Agitation   Late latent syphilis  HIV/AIDS Concern for HIV Dementia History of Positive RPR Potential placement pending. Appreciate TOC and leadership's assistance. Pt today is much more alert, his memory has been somewhat improving and has recognized and asked for staff that has previously seen him. Today he remembered he was in the hospital because of an infection. Easily redirectable, followed instructions from staff, and has not tried to wonder or leave AMA.  - Continue Zyprexa  5 mg in the Am and PM, and 10 mg at bedtime  - Continue Biktarvy  for his HIV - Continue bactrim  for PJP prophylaxis.  - Delirium precautions  - s/p treatment for potential late latent syphilis   -supervision with feeding, drinking, ambulating, self care  Hyponatremia Elevated Creatinine- resolved Will  need to encourage PO intake.  - cw Encouragement for PO intake.  - Strict I/Os    HTN  Stable on 5mg  amlodipine . Cw same.    HCV Early Cirrhosis  Thrombocytopenia NAGMA Positive HCV with elevated viral load and imaging revealing early cirrhosis. Thrombocytopenia stable today.  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  - Continue urea  cream and moisturizer TID -Will need FU with Podiatry outpatient   Severely reduced right ventricular systolic fxn Pulmonary HTN Per cardiology, pt is a poor cadidate for RH catheterization and advanced cardiac therapies.   Anemia of Chronic Disease  Ferritin in the 700s. Will continue to monitor. Asymptomatic.  Diet: Normal IVF: None VTE: Xarelto  Code: Full PT/OT recs: None   Prior to Admission  Living Arrangement: Temp housing with sister Anticipated Discharge Location: SNF or memory facility Barriers to Discharge: SNF or memory facility placement   LOS: 30 days   Volney, [place], MD 03/05/2023, 2:15 PM

## 2023-03-06 DIAGNOSIS — B20 Human immunodeficiency virus [HIV] disease: Secondary | ICD-10-CM | POA: Diagnosis not present

## 2023-03-06 DIAGNOSIS — R7881 Bacteremia: Secondary | ICD-10-CM | POA: Diagnosis not present

## 2023-03-06 DIAGNOSIS — B953 Streptococcus pneumoniae as the cause of diseases classified elsewhere: Secondary | ICD-10-CM | POA: Diagnosis not present

## 2023-03-06 DIAGNOSIS — F0280 Dementia in other diseases classified elsewhere without behavioral disturbance: Secondary | ICD-10-CM | POA: Diagnosis not present

## 2023-03-06 NOTE — TOC Progression Note (Signed)
 Transition of Care [facility]) - Progression Note    Patient Details  Name: Russell [name] MRN: [id number] Date of Birth: 1965/06/02  Transition of [facility]) CM/SW Contact  Hazael Olveda A Marieli Rudy, LCSW Phone Number: 03/06/2023, 5:28 PM  Clinical Narrative:      CSW received call from social worker/case manager Thressa Gull regarding update on inquiry for Medicaid change. She stated that CSW should reach out to [facility] Ombudsman at [phone] regarding assistance with changing pt's Medicaid as they can assist with process. She said that pt has 2 medicaid statuses and that this process could expedite pt obtaining required [place] Medicaid for placement.    TOC will continue to follow.       Expected Discharge Plan and Services                                               Social Determinants of Health (SDOH) Interventions SDOH Screenings   Food Insecurity: No Food Insecurity (02/04/2023)  Housing: High Risk (02/04/2023)  Transportation Needs: No Transportation Needs (02/04/2023)  Utilities: Not At Risk (02/04/2023)  Tobacco Use: High Risk (02/03/2023)    Readmission Risk Interventions     No data to display

## 2023-03-06 NOTE — Progress Notes (Signed)
 Summary: KENDERICK KOBLER is a 58 y.o. male prior history of substance use disorder, housing instability, admitted for acute encephalopathy secondary to sepsis on chronic HIV dementia, currently awaiting placement.  Subjective:  Doing well today. Would like to see if he likes [place] before going but understands that he is in the hospital  Objective:  Vital signs in last 24 hours: Vitals:   03/05/23 1632 03/05/23 2022 03/06/23 0506 03/06/23 0902  BP: 125/77 139/77 126/81 116/67  Pulse: 91 97 92 96  Resp: 18 18 18 19   Temp: 98 F (36.7 C) 98.4 F (36.9 C) 97.7 F (36.5 C) 97.6 F (36.4 C)  TempSrc: Oral Oral Oral Oral  SpO2: 99% 98% 99% 100%  Weight:      Height:       General: Pleasant, well-appearing male  CV: RRR. No murmurs, rubs, or gallops. No LE edema Pulmonary: Lungs CTAB Abdominal: Soft, nontender, nondistended. Normal bowel sounds. Extremities: Palpable radial and DP pulses. Normal ROM. Skin: Warm and dry. Icthyosis improved.  Neuro: A&Ox self and location but not to date. Knows that he is here because he has a viral infection. Psych: Normal mood and affect   Weight change:  No intake or output data in the 24 hours ending 03/06/23 1311       Latest Ref Rng & Units 03/02/2023    6:32 AM 02/23/2023    7:03 AM 02/22/2023    7:25 AM  BMP  Glucose 70 - 99 mg/dL 899  97  86   BUN 6 - 20 mg/dL 13  13  14    Creatinine 0.61 - 1.24 mg/dL 9.03  8.82  8.83   Sodium 135 - 145 mmol/L 133  132  132   Potassium 3.5 - 5.1 mmol/L 3.7  4.0  4.2   Chloride 98 - 111 mmol/L 108  106  107   CO2 22 - 32 mmol/L 19  19  19    Calcium  8.9 - 10.3 mg/dL 8.0  8.4  8.4     Assessment/Plan:  Principal Problem:   Pneumococcal bacteremia Active Problems:   Sepsis (HCC)   Fall   Encephalopathy   Community acquired pneumonia   Thrombocytopenia (HCC)   Hematuria   Homelessness   Alcoholism (HCC)   AIDS (HCC)   Positive RPR test   Cholelithiasis without cholecystitis    Aortic atherosclerosis (HCC)   Cerebral atrophy (HCC)   Horseshoe kidney   Malnutrition of moderate degree   HIV dementia (HCC)   HCV (hepatitis C virus)   Pulmonary hypertension (HCC)   Right ventricular systolic dysfunction   Biological false positive RPR test   Onychomycosis   Agitation   Late latent syphilis  HIV/AIDS Concern for HIV Dementia History of Positive RPR Patient is stable and pending insurance adjustments for discharge. Easily redirectable, no events ON.   - Continue Zyprexa  5 mg in the Am and PM, and 10 mg at bedtime  - Continue Biktarvy  for his HIV - Continue bactrim  for PJP prophylaxis.  - Delirium precautions  - s/p treatment for potential late latent syphilis   -supervision with feeding, drinking, ambulating, self care  Hyponatremia Elevated Creatinine- resolved Will need to encourage PO intake.  - cw Encouragement for PO intake.  - Strict I/Os    HTN  Stable on 5mg  amlodipine . Cw same.    HCV Early Cirrhosis  Thrombocytopenia NAGMA Positive HCV with elevated viral load and imaging revealing early cirrhosis. Thrombocytopenia stable today.  -Outpatient follow  up   Icthyosis R>L Onychomycosis- bilateral Severe; improving.  - Continue urea  cream and moisturizer TID -Will need FU with Podiatry outpatient   Severely reduced right ventricular systolic fxn Pulmonary HTN Per cardiology, pt is a poor cadidate for RH catheterization and advanced cardiac therapies.   Anemia of Chronic Disease  Ferritin in the 700s. Will continue to monitor. Asymptomatic.  Diet: Normal IVF: None VTE: Xarelto  Code: Full PT/OT recs: None   Prior to Admission Living Arrangement: Temp housing with sister Anticipated Discharge Location: SNF or memory facility Barriers to Discharge: SNF or memory facility placement   LOS: 31 days   Volney, [place], MD 03/06/2023, 1:11 PM

## 2023-03-07 DIAGNOSIS — R7881 Bacteremia: Secondary | ICD-10-CM | POA: Diagnosis not present

## 2023-03-07 DIAGNOSIS — B953 Streptococcus pneumoniae as the cause of diseases classified elsewhere: Secondary | ICD-10-CM | POA: Diagnosis not present

## 2023-03-07 NOTE — Plan of Care (Signed)

## 2023-03-07 NOTE — Progress Notes (Signed)
 Hospital day#32 Subjective:   Summary: Russell [name] is a 58 yo male with significant medical history of chronic HIV dementia, prior substance use disorder complicated by housing instability, initially admitted for acute encephalopathy due to sepsis, now resolved. 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 He is stable and currently awaiting placement to ALF given needs for 24 hour supervision pending Medicaid status change.   Overnight Events: None   No acute events. Denies pain, abdominal discomfort.   Objective:  Vital signs in last 24 hours: Vitals:   03/06/23 0506 03/06/23 0902 03/06/23 2050 03/07/23 0317  BP: 126/81 116/67 (!) 157/83 126/77  Pulse: 92 96 (!) 102 99  Resp: 18 19 18 18   Temp: 97.7 F (36.5 C) 97.6 F (36.4 C) 97.8 F (36.6 C) 98 F (36.7 C)  TempSrc: Oral Oral Oral   SpO2: 99% 100% 99% 99%  Weight:      Height:       Supplemental O2: Room Air SpO2: 99 % Filed Weights   02/03/23 1800  Weight: 57.8 kg    Physical Exam:  Constitutional: Well appearing Cardiovascular: regular rate and rhythm, no m/r/g Pulmonary/Chest: normal work of breathing on room air, lungs clear to auscultation bilaterally. Abdominal: Soft MSK:no pitting edema Neurological: Alert and oriented to self Skin: warm and dry Psych: Normal mood and affect   No intake or output data in the 24 hours ending 03/07/23 0623 Net IO Since Admission: 39,447.41 mL [03/07/23 0623]  Pertinent Labs:    Latest Ref Rng & Units 03/02/2023    6:32 AM 02/16/2023    9:08 AM 02/15/2023    6:38 AM  CBC  WBC 4.0 - 10.5 K/uL 3.1  3.5  3.8   Hemoglobin 13.0 - 17.0 g/dL 88.6  87.4  87.9   Hematocrit 39.0 - 52.0 % 32.9  36.1  34.6   Platelets 150 - 400 K/uL 100  94  88        Latest Ref Rng & Units 03/02/2023    6:32 AM 02/23/2023    7:03 AM 02/22/2023    7:25 AM  CMP  Glucose 70 - 99 mg/dL 899  97  86   BUN 6 - 20 mg/dL 13  13  14     Creatinine 0.61 - 1.24 mg/dL 9.03  8.82  8.83   Sodium 135 - 145 mmol/L 133  132  132   Potassium 3.5 - 5.1 mmol/L 3.7  4.0  4.2   Chloride 98 - 111 mmol/L 108  106  107   CO2 22 - 32 mmol/L 19  19  19    Calcium  8.9 - 10.3 mg/dL 8.0  8.4  8.4     Imaging: No results found.  Assessment/Plan:   Principal Problem:   Pneumococcal bacteremia Active Problems:   Sepsis (HCC)   Fall   Encephalopathy   Community acquired pneumonia   Thrombocytopenia (HCC)   Hematuria   Homelessness   Alcoholism (HCC)   AIDS (HCC)   Positive RPR test   Cholelithiasis without cholecystitis   Aortic atherosclerosis (HCC)   Cerebral atrophy (HCC)   Horseshoe kidney   Malnutrition of moderate degree   HIV dementia (HCC)   HCV (hepatitis C virus)   Pulmonary hypertension (HCC)   Right ventricular systolic dysfunction   Biological false positive RPR test   Onychomycosis   Agitation   Late latent syphilis  HIV/AIDS Cognitive impairment c/f HIV  Dementia History of Positive RPR Housing instability Stable and pending Medicaid status for approval to [facility] ALF.  - Appreciate TOC's guidance and support - Continue Zyprexa  5 mg in the Am and PM, and 10 mg at bedtime  - Continue Biktarvy  for his HIV - Continue bactrim  for PJP prophylaxis.  - Delirium precautions  - S/p treatment for suspected late latent syphilis  HCV Cirrhosis Thrombocytopenia No ascites or recent fevers. Stable - Will need outpatient follow up  Chronic hyponatremia Stable - Encourage PO intake - Weekly labs until discharge  Severely reduced right ventricular systolic fxn Pulmonary HTN No evidence of peripheral congestion. Stable. - Not a candidate for RH catheterization or advanced therapies  Diet: Normal VTE: NOAC Code: Full PT/OT recs: ALF, pending Medicaid  Dispo: Anticipated discharge to  SNF/Memory unit with 24 hour supervision  Hadassah Kristy Ahr, MD Internal Medicine Resident PGY-2 Please  contact the on call pager after 5 pm and on weekends at [phone].

## 2023-03-08 DIAGNOSIS — R7881 Bacteremia: Secondary | ICD-10-CM | POA: Diagnosis not present

## 2023-03-08 DIAGNOSIS — B953 Streptococcus pneumoniae as the cause of diseases classified elsewhere: Secondary | ICD-10-CM | POA: Diagnosis not present

## 2023-03-08 LAB — RESP PANEL BY RT-PCR (RSV, FLU A&B, COVID)  RVPGX2
Influenza A by PCR: NEGATIVE
Influenza B by PCR: NEGATIVE
Resp Syncytial Virus by PCR: NEGATIVE
SARS Coronavirus 2 by RT PCR: NEGATIVE

## 2023-03-08 MED ORDER — LACTATED RINGERS IV BOLUS: 500.0000 mL | Freq: Once | INTRAVENOUS | Status: DC

## 2023-03-08 MED ORDER — ACETAMINOPHEN 325 MG PO TABS
650.0000 mg | ORAL_TABLET | Freq: Four times a day (QID) | ORAL | Status: DC
  Administered 2023-03-08 – 2023-03-16 (×13): 650 mg via ORAL
  Filled 2023-03-08 (×15): qty 2

## 2023-03-08 MED ORDER — ACETAMINOPHEN 325 MG PO TABS: 650.0000 mg | ORAL_TABLET | Freq: Four times a day (QID) | ORAL | Status: DC | PRN

## 2023-03-08 NOTE — Plan of Care (Signed)

## 2023-03-08 NOTE — Plan of Care (Signed)

## 2023-03-08 NOTE — Progress Notes (Signed)
 Hospital day#33 Subjective:   Summary: Russell [name] is a 58 yo male with significant medical history of chronic HIV dementia, prior substance use disorder complicated by housing instability, initially admitted for acute encephalopathy due to sepsis, now resolved. 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 He is stable and currently awaiting placement to ALF given needs for 24 hour supervision pending Medicaid status change.   Overnight Events: None  Endorsing myalgias, rhinorrhea, and frontal headache today since yesterday. Denies chills, difficulty eating, nausea, vomiting or diarrhea.   Objective:  Vital signs in last 24 hours: Vitals:   03/07/23 0317 03/07/23 1532 03/08/23 0811 03/08/23 0812  BP: 126/77 130/81 101/86 101/86  Pulse: 99   94  Resp: 18 17  18   Temp: 98 F (36.7 C) 98.1 F (36.7 C)    TempSrc:      SpO2: 99% 100%  99%  Weight:      Height:       Supplemental O2: Room Air SpO2: 99 % Filed Weights   02/03/23 1800  Weight: 57.8 kg    Physical Exam:  Constitutional:  tired appearing man, resting in bed with eyes closed HEENT: non erythematous sclera, no pain with EOM, clear nasal discharge, and dry mucus membranes Cardiovascular: regular rate and rhythm, no m/r/g Pulmonary/Chest: normal work of breathing on room air, lungs clear to auscultation bilaterally. Abdominal: Soft, non tender, non distended MSK: no pitting edema Neurological: Alert and oriented to self.  Skin: warm and dry Psych: normal mood and affect   No intake or output data in the 24 hours ending 03/08/23 1036 Net IO Since Admission: 39,447.41 mL [03/08/23 1036]  Pertinent Labs:    Latest Ref Rng & Units 03/02/2023    6:32 AM 02/16/2023    9:08 AM 02/15/2023    6:38 AM  CBC  WBC 4.0 - 10.5 K/uL 3.1  3.5  3.8   Hemoglobin 13.0 - 17.0 g/dL 88.6  87.4  87.9   Hematocrit 39.0 - 52.0 % 32.9  36.1  34.6   Platelets 150 - 400  K/uL 100  94  88        Latest Ref Rng & Units 03/02/2023    6:32 AM 02/23/2023    7:03 AM 02/22/2023    7:25 AM  CMP  Glucose 70 - 99 mg/dL 899  97  86   BUN 6 - 20 mg/dL 13  13  14    Creatinine 0.61 - 1.24 mg/dL 9.03  8.82  8.83   Sodium 135 - 145 mmol/L 133  132  132   Potassium 3.5 - 5.1 mmol/L 3.7  4.0  4.2   Chloride 98 - 111 mmol/L 108  106  107   CO2 22 - 32 mmol/L 19  19  19    Calcium  8.9 - 10.3 mg/dL 8.0  8.4  8.4     Imaging: No results found.  Assessment/Plan:   Principal Problem:   Pneumococcal bacteremia Active Problems:   Sepsis (HCC)   Fall   Encephalopathy   Community acquired pneumonia   Thrombocytopenia (HCC)   Hematuria   Homelessness   Alcoholism (HCC)   AIDS (HCC)   Positive RPR test   Cholelithiasis without cholecystitis   Aortic atherosclerosis (HCC)   Cerebral atrophy (HCC)   Horseshoe kidney   Malnutrition of moderate degree   HIV dementia (HCC)   HCV (hepatitis C virus)   Pulmonary hypertension (HCC)  Right ventricular systolic dysfunction   Biological false positive RPR test   Onychomycosis   Agitation   Late latent syphilis  HIV/AIDS Cognitive impairment c/f HIV Dementia History of Positive RPR Housing instability Stable. Medicaid status for approval to [facility] ALF.  - Appreciate TOC's guidance and support - Continue Zyprexa  5 mg in the Am and PM, and 10 mg at bedtime  - Continue Biktarvy  for his HIV - Continue bactrim  for PJP prophylaxis.  - Delirium precautions  - S/p treatment for suspected late latent syphilis  Concern for URI VSS. No leukocytosis. However, given long hospitalization and URI symptoms, will test with limited RVP. - If RVP neg, extend panel - Symptomatic treatment with tylenol  - 500 mL bolus ordered; patient with severely reduced ejection fraction  HCV Cirrhosis Thrombocytopenia No ascites or recent fevers. Stable - Will need outpatient follow up  Chronic hyponatremia Stable - Encourage  PO intake - Weekly labs until discharge, next due on 2/10  Severely reduced right ventricular systolic fxn Pulmonary HTN No evidence of peripheral congestion. Stable. - Not a candidate for RH catheterization or advanced therapies  Diet: Normal VTE: NOAC Code: Full PT/OT recs: ALF, pending Medicaid approval  Dispo: Anticipated discharge to  SNF/Memory unit with 24 hour supervision  Hadassah Kristy Ahr, MD Internal Medicine Resident PGY-2 Please contact the on call pager after 5 pm and on weekends at [phone].

## 2023-03-09 DIAGNOSIS — B953 Streptococcus pneumoniae as the cause of diseases classified elsewhere: Secondary | ICD-10-CM | POA: Diagnosis not present

## 2023-03-09 DIAGNOSIS — F0280 Dementia in other diseases classified elsewhere without behavioral disturbance: Secondary | ICD-10-CM | POA: Diagnosis not present

## 2023-03-09 DIAGNOSIS — R7881 Bacteremia: Secondary | ICD-10-CM | POA: Diagnosis not present

## 2023-03-09 DIAGNOSIS — B20 Human immunodeficiency virus [HIV] disease: Secondary | ICD-10-CM | POA: Diagnosis not present

## 2023-03-09 LAB — CBC
HCT: 32 % — ABNORMAL LOW (ref 39.0–52.0)
Hemoglobin: 10.7 g/dL — ABNORMAL LOW (ref 13.0–17.0)
MCH: 33.9 pg (ref 26.0–34.0)
MCHC: 33.4 g/dL (ref 30.0–36.0)
MCV: 101.3 fL — ABNORMAL HIGH (ref 80.0–100.0)
Platelets: 95 10*3/uL — ABNORMAL LOW (ref 150–400)
RBC: 3.16 MIL/uL — ABNORMAL LOW (ref 4.22–5.81)
RDW: 15.6 % — ABNORMAL HIGH (ref 11.5–15.5)
WBC: 3.6 10*3/uL — ABNORMAL LOW (ref 4.0–10.5)
nRBC: 0 % (ref 0.0–0.2)

## 2023-03-09 LAB — BASIC METABOLIC PANEL
Anion gap: 5 (ref 5–15)
BUN: 12 mg/dL (ref 6–20)
CO2: 20 mmol/L — ABNORMAL LOW (ref 22–32)
Calcium: 8.3 mg/dL — ABNORMAL LOW (ref 8.9–10.3)
Chloride: 107 mmol/L (ref 98–111)
Creatinine, Ser: 1.01 mg/dL (ref 0.61–1.24)
GFR, Estimated: >60 mL/min (ref >60)
Glucose, Bld: 82 mg/dL (ref 70–99)
Potassium: 3.8 mmol/L (ref 3.5–5.1)
Sodium: 132 mmol/L — ABNORMAL LOW (ref 135–145)

## 2023-03-09 LAB — MAGNESIUM: Magnesium: 1.8 mg/dL (ref 1.7–2.4)

## 2023-03-09 NOTE — Progress Notes (Signed)
 Mobility Specialist Progress Note:   03/09/23 1518  Mobility  Activity Ambulated independently in room  Level of Assistance Independent  Assistive Device None  Distance Ambulated (ft) 30 ft  Activity Response Tolerated well  Mobility Referral Yes  Mobility visit 1 Mobility  Mobility Specialist Start Time (ACUTE ONLY) 1500  Mobility Specialist Stop Time (ACUTE ONLY) 1515  Mobility Specialist Time Calculation (min) (ACUTE ONLY) 15 min   Pt received ambulating in room, cleaning and making bed. Declined ambulating in hallway at this time. Pt pleasantly confused. Ambulated in room independently, no AD. Left pt in room with all needs met.    Detta Mellin Mobility Specialist Please contact via Special educational needs teacher or  Rehab office at [phone]

## 2023-03-09 NOTE — Plan of Care (Signed)
  Problem: Clinical Measurements: Goal: Cardiovascular complication will be avoided Outcome: Progressing   Problem: Activity: Goal: Risk for activity intolerance will decrease Outcome: Progressing   Problem: Nutrition: Goal: Adequate nutrition will be maintained Outcome: Progressing   

## 2023-03-09 NOTE — Progress Notes (Addendum)
 HD#34 Subjective:   Summary: Russell [name] is a 58 y.o. male with significant medical history of chronic HIV dementia, prior substance use disorder complicated by housing instability, initially admitted for acute encephalopathy due to sepsis, now resolved. 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 He is stable and currently awaiting placement to ALF given needs for 24 hour supervision pending Medicaid status change.   Overnight Events: None  States he is feeling well today. Would like to avoid additional workup / exam. Denies SOB, cough, congestion, HA.  Objective:  Vital signs in last 24 hours: Vitals:   03/08/23 1603 03/08/23 2044 03/09/23 0355 03/09/23 0932  BP: (!) 135/95 130/85 128/84 120/78  Pulse: 90 98 92 (!) 101  Resp: 16 18 18 18   Temp: 97.6 F (36.4 C) 98.2 F (36.8 C) 98.3 F (36.8 C)   TempSrc: Oral Oral Oral   SpO2: 100% 98% 100% 100%  Weight:      Height:       Supplemental O2: Room Air SpO2: 100 %   Physical Exam:  Constitutional: Well appearing, eating breakfast.  HENT: normocephalic atraumatic Eyes: conjunctiva non-erythematous Pulmonary/Chest: normal work of breathing on room air Psych: Appropriate affect   Filed Weights   02/03/23 1800  Weight: 57.8 kg     Pertinent Labs:    Latest Ref Rng & Units 03/09/2023    6:26 AM 03/02/2023    6:32 AM 02/16/2023    9:08 AM  CBC  WBC 4.0 - 10.5 K/uL 3.6  3.1  3.5   Hemoglobin 13.0 - 17.0 g/dL 40.9  81.1  91.4   Hematocrit 39.0 - 52.0 % 32.0  32.9  36.1   Platelets 150 - 400 K/uL 95  100  94        Latest Ref Rng & Units 03/09/2023    6:26 AM 03/02/2023    6:32 AM 02/23/2023    7:03 AM  CMP  Glucose 70 - 99 mg/dL 82  782  97   BUN 6 - 20 mg/dL 12  13  13    Creatinine 0.61 - 1.24 mg/dL 9.56  2.13  0.86   Sodium 135 - 145 mmol/L 132  133  132   Potassium 3.5 - 5.1 mmol/L 3.8  3.7  4.0   Chloride 98 - 111 mmol/L 107  108  106   CO2 22  - 32 mmol/L 20  19  19    Calcium  8.9 - 10.3 mg/dL 8.3  8.0  8.4     Assessment/Plan:   Principal Problem:   Pneumococcal bacteremia Active Problems:   Sepsis (HCC)   Fall   Encephalopathy   Community acquired pneumonia   Thrombocytopenia (HCC)   Hematuria   Homelessness   Alcoholism (HCC)   AIDS (HCC)   Positive RPR test   Cholelithiasis without cholecystitis   Aortic atherosclerosis (HCC)   Cerebral atrophy (HCC)   Horseshoe kidney   Malnutrition of moderate degree   HIV dementia (HCC)   HCV (hepatitis C virus)   Pulmonary hypertension (HCC)   Right ventricular systolic dysfunction   Biological false positive RPR test   Onychomycosis   Agitation   Late latent syphilis   HIV/AIDS Cognitive impairment c/f HIV Dementia History of Positive RPR Housing instability Stable, pending dispo planning. - Appreciate TOC's guidance and support - Continue Zyprexa  5 mg in the Am and PM, and 10 mg at bedtime  - Continue Biktarvy   for his HIV - Continue bactrim  for PJP prophylaxis.  - Delirium precautions  - S/p treatment for suspected late latent syphilis   Concern for URI Feels well today, no cough or SOB. Denies congestion or sick symptoms - Symptomatic treatment   HCV Cirrhosis Thrombocytopenia No ascites or recent fevers. Stable - Will need outpatient follow up   Chronic hyponatremia Stable - Encourage PO intake - Weekly labs until discharge, next due on 2/17   Severely reduced right ventricular systolic fxn Pulmonary HTN No evidence of peripheral congestion. Stable. - Not a candidate for RH catheterization or advanced therapies   Diet: Normal VTE: NOAC Code: Full PT/OT recs: ALF, pending Medicaid approval   Dispo: Anticipated discharge to  SNF/Memory unit with 24 hour supervision   Diet: Normal IVF: None,None VTE: DOAC Code: Full PT/OT recs:  ALF ,   Dispo: Anticipated discharge to  SNF / Memory unit . Medically stable for discharge.   Sheree Dieter MD Internal Medicine Resident PGY-1 Pager: [phone] Please contact the on call pager after 5 pm and on weekends at [phone].

## 2023-03-09 NOTE — Plan of Care (Signed)
  Problem: Education: Goal: Knowledge of General Education information will improve Description: Including pain rating scale, medication(s)/side effects and non-pharmacologic comfort measures Outcome: Progressing   Problem: Health Behavior/Discharge Planning: Goal: Ability to manage health-related needs will improve Outcome: Progressing   Problem: Clinical Measurements: Goal: Will remain free from infection Outcome: Progressing Goal: Diagnostic test results will improve Outcome: Progressing Goal: Respiratory complications will improve Outcome: Progressing Goal: Cardiovascular complication will be avoided Outcome: Progressing   Problem: Activity: Goal: Risk for activity intolerance will decrease Outcome: Progressing   Problem: Nutrition: Goal: Adequate nutrition will be maintained Outcome: Progressing   Problem: Coping: Goal: Level of anxiety will decrease Outcome: Progressing   Problem: Elimination: Goal: Will not experience complications related to bowel motility Outcome: Progressing Goal: Will not experience complications related to urinary retention Outcome: Progressing   Problem: Pain Management: Goal: General experience of comfort will improve Outcome: Progressing   Problem: Safety: Goal: Ability to remain free from injury will improve Outcome: Progressing   Problem: Clinical Measurements: Goal: Ability to maintain clinical measurements within normal limits will improve Outcome: Not Progressing   Problem: Skin Integrity: Goal: Risk for impaired skin integrity will decrease Outcome: Not Progressing

## 2023-03-10 DIAGNOSIS — B20 Human immunodeficiency virus [HIV] disease: Secondary | ICD-10-CM | POA: Diagnosis not present

## 2023-03-10 DIAGNOSIS — B953 Streptococcus pneumoniae as the cause of diseases classified elsewhere: Secondary | ICD-10-CM | POA: Diagnosis not present

## 2023-03-10 DIAGNOSIS — F0280 Dementia in other diseases classified elsewhere without behavioral disturbance: Secondary | ICD-10-CM | POA: Diagnosis not present

## 2023-03-10 DIAGNOSIS — R7881 Bacteremia: Secondary | ICD-10-CM | POA: Diagnosis not present

## 2023-03-10 NOTE — Progress Notes (Addendum)
HD#35 Subjective:   Summary: Russell [name] is a 58 y.o. male with significant medical history of chronic HIV dementia, prior substance use disorder complicated by housing instability, initially admitted for acute encephalopathy due to sepsis, now resolved. 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 He is stable and currently awaiting placement to ALF given needs for 24 hour supervision pending Medicaid status change.   Overnight Events: None  Again states he is feeling well today. He would like to avoid additional workup / exam. Denies SOB, cough, congestion, HA. Social work working on placement. This will take some time - perhaps weeks even.   Objective:  Vital signs in last 24 hours: Vitals:   03/09/23 0932 03/09/23 1731 03/09/23 2038 03/10/23 0459  BP: 120/78 134/76 122/71 115/71  Pulse: (!) 101 97 91 88  Resp: 18 18 18 18   Temp:   98.2 F (36.8 C) 97.9 F (36.6 C)  TempSrc:   Oral Oral  SpO2: 100% 98% 99% 97%  Weight:      Height:       Supplemental O2: Room Air SpO2: 97 %   Physical Exam:  Constitutional: Well appearing, eating breakfast.  Pulmonary/Chest: normal work of breathing on room air Psych: Appropriate affect   Filed Weights   02/03/23 1800  Weight: 57.8 kg     Pertinent Labs:    Latest Ref Rng & Units 03/09/2023    6:26 AM 03/02/2023    6:32 AM 02/16/2023    9:08 AM  CBC  WBC 4.0 - 10.5 K/uL 3.6  3.1  3.5   Hemoglobin 13.0 - 17.0 g/dL 16.1  09.6  04.5   Hematocrit 39.0 - 52.0 % 32.0  32.9  36.1   Platelets 150 - 400 K/uL 95  100  94        Latest Ref Rng & Units 03/09/2023    6:26 AM 03/02/2023    6:32 AM 02/23/2023    7:03 AM  CMP  Glucose 70 - 99 mg/dL 82  409  97   BUN 6 - 20 mg/dL 12  13  13    Creatinine 0.61 - 1.24 mg/dL 8.11  9.14  7.82   Sodium 135 - 145 mmol/L 132  133  132   Potassium 3.5 - 5.1 mmol/L 3.8  3.7  4.0   Chloride 98 - 111 mmol/L 107  108  106   CO2 22 - 32  mmol/L 20  19  19    Calcium 8.9 - 10.3 mg/dL 8.3  8.0  8.4     Assessment/Plan:   Principal Problem:   Pneumococcal bacteremia Active Problems:   Sepsis (HCC)   Fall   Encephalopathy   Community acquired pneumonia   Thrombocytopenia (HCC)   Hematuria   Homelessness   Alcoholism (HCC)   AIDS (HCC)   Positive RPR test   Cholelithiasis without cholecystitis   Aortic atherosclerosis (HCC)   Cerebral atrophy (HCC)   Horseshoe kidney   Malnutrition of moderate degree   HIV dementia (HCC)   HCV (hepatitis C virus)   Pulmonary hypertension (HCC)   Right ventricular systolic dysfunction   Biological false positive RPR test   Onychomycosis   Agitation   Late latent syphilis   HIV/AIDS Cognitive impairment c/f HIV Dementia History of Positive RPR Housing instability Stable, pending dispo planning. - Appreciate TOC's guidance and support - Continue Zyprexa 5 mg in the Am and PM, and 10  mg at bedtime  - Continue Biktarvy for his HIV - Continue bactrim for PJP prophylaxis.  - Delirium precautions  - S/p treatment for suspected late latent syphilis   Concern for URI, resolved Feels well today, no cough or SOB. Denies congestion or sick symptoms - Symptomatic treatment   HCV Cirrhosis Thrombocytopenia No ascites or recent fevers. Stable - Will need outpatient follow up   Chronic hyponatremia Stable - Encourage PO intake - Weekly labs until discharge, next due on 2/17   Severely reduced right ventricular systolic fxn Pulmonary HTN No evidence of peripheral congestion. Stable. - Not a candidate for RH catheterization or advanced therapies    Diet: Normal IVF: None,None VTE: DOAC Code: Full PT/OT recs:  ALF ,   Dispo: Anticipated discharge to  SNF / Memory unit . Medically stable for discharge.   [name] MD Internal Medicine Resident PGY-1 Pager: [phone] Please contact the on call pager after 5 pm and on weekends at [phone].

## 2023-03-10 NOTE — Plan of Care (Signed)

## 2023-03-10 NOTE — Plan of Care (Signed)
Problem: Clinical Measurements: Goal: Will remain free from infection Outcome: Progressing   Problem: Clinical Measurements: Goal: Diagnostic test results will improve Outcome: Progressing   Problem: Clinical Measurements: Goal: Respiratory complications will improve Outcome: Progressing

## 2023-03-11 DIAGNOSIS — R7881 Bacteremia: Secondary | ICD-10-CM | POA: Diagnosis not present

## 2023-03-11 DIAGNOSIS — F0280 Dementia in other diseases classified elsewhere without behavioral disturbance: Secondary | ICD-10-CM | POA: Diagnosis not present

## 2023-03-11 DIAGNOSIS — B20 Human immunodeficiency virus [HIV] disease: Secondary | ICD-10-CM | POA: Diagnosis not present

## 2023-03-11 DIAGNOSIS — B953 Streptococcus pneumoniae as the cause of diseases classified elsewhere: Secondary | ICD-10-CM | POA: Diagnosis not present

## 2023-03-11 NOTE — Progress Notes (Addendum)
HD#36 Subjective:   Summary: Russell [name] is a 58 y.o. male with significant medical history of chronic HIV dementia, prior substance use disorder complicated by housing instability, initially admitted for acute encephalopathy due to sepsis, now resolved. 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 He is stable and currently awaiting placement to ALF given needs for 24 hour supervision pending Medicaid status change.   Overnight Events: None  Again states he is feeling well today. He would like to avoid additional workup / exam. Social work working on placement. This will take some time - perhaps weeks even. This was discussed with the patient. Patient attempting to call sister this AM.  Objective:  Vital signs in last 24 hours: Vitals:   03/09/23 2038 03/10/23 0459 03/10/23 1650 03/10/23 2021  BP: 122/71 115/71 122/77 134/73  Pulse: 91 88 95 95  Resp: 18 18 18 18   Temp: 98.2 F (36.8 C) 97.9 F (36.6 C)  98.5 F (36.9 C)  TempSrc: Oral Oral  Oral  SpO2: 99% 97% 98% 99%  Weight:      Height:       Supplemental O2: Room Air SpO2: 99 %   Physical Exam:  Constitutional: Well appearing Pulmonary/Chest: normal work of breathing on room air  Neuro: Moving all limbs spontaneously.  Psych: Appropriate affect,  Filed Weights   02/03/23 1800  Weight: 57.8 kg     Pertinent Labs:    Latest Ref Rng & Units 03/09/2023    6:26 AM 03/02/2023    6:32 AM 02/16/2023    9:08 AM  CBC  WBC 4.0 - 10.5 K/uL 3.6  3.1  3.5   Hemoglobin 13.0 - 17.0 g/dL 16.1  09.6  04.5   Hematocrit 39.0 - 52.0 % 32.0  32.9  36.1   Platelets 150 - 400 K/uL 95  100  94        Latest Ref Rng & Units 03/09/2023    6:26 AM 03/02/2023    6:32 AM 02/23/2023    7:03 AM  CMP  Glucose 70 - 99 mg/dL 82  409  97   BUN 6 - 20 mg/dL 12  13  13    Creatinine 0.61 - 1.24 mg/dL 8.11  9.14  7.82   Sodium 135 - 145 mmol/L 132  133  132   Potassium 3.5 -  5.1 mmol/L 3.8  3.7  4.0   Chloride 98 - 111 mmol/L 107  108  106   CO2 22 - 32 mmol/L 20  19  19    Calcium 8.9 - 10.3 mg/dL 8.3  8.0  8.4     Assessment/Plan:   Principal Problem:   Pneumococcal bacteremia Active Problems:   Sepsis (HCC)   Fall   Encephalopathy   Community acquired pneumonia   Thrombocytopenia (HCC)   Hematuria   Homelessness   Alcoholism (HCC)   AIDS (HCC)   Positive RPR test   Cholelithiasis without cholecystitis   Aortic atherosclerosis (HCC)   Cerebral atrophy (HCC)   Horseshoe kidney   Malnutrition of moderate degree   HIV dementia (HCC)   HCV (hepatitis C virus)   Pulmonary hypertension (HCC)   Right ventricular systolic dysfunction   Biological false positive RPR test   Onychomycosis   Agitation   Late latent syphilis   HIV/AIDS Cognitive impairment c/f HIV Dementia History of Positive RPR Housing instability Stable, pending dispo planning. - Appreciate TOC's guidance and support -  Continue Zyprexa 5 mg in the Am and PM, and 10 mg at bedtime  - Continue Biktarvy for his HIV - Continue bactrim for PJP prophylaxis.  - Delirium precautions  - S/p treatment for suspected late latent syphilis    HCV Cirrhosis Thrombocytopenia No ascites or recent fevers. Stable - Will need outpatient follow up   Chronic hyponatremia Stable - Encourage PO intake - Weekly labs until discharge, next due on 2/17   Severely reduced right ventricular systolic fxn Pulmonary HTN No evidence of peripheral congestion. Stable. - Not a candidate for RH catheterization or advanced therapies  HTN: amlodipine 5 mg  HLD: rosuvastatin 5 mg    Diet: Normal IVF: None,None VTE: DOAC Code: Full PT/OT recs:  ALF  Dispo: Anticipated discharge to  SNF / Memory unit . Medically stable for discharge.   [name] MD Internal Medicine Resident PGY-1 Pager: [phone] Please contact the on call pager after 5 pm and on weekends at [phone].

## 2023-03-11 NOTE — Plan of Care (Signed)

## 2023-03-12 DIAGNOSIS — R7881 Bacteremia: Secondary | ICD-10-CM | POA: Diagnosis not present

## 2023-03-12 DIAGNOSIS — B953 Streptococcus pneumoniae as the cause of diseases classified elsewhere: Secondary | ICD-10-CM | POA: Diagnosis not present

## 2023-03-12 NOTE — Plan of Care (Signed)

## 2023-03-12 NOTE — TOC Progression Note (Addendum)
Transition of Care [facility]) - Progression Note    Patient Details  Name: Russell [name] MRN: [id number] Date of Birth: 09-Jun-1965  Transition of [facility]) CM/SW Contact  Dannie Woolen A [place], LCSW Phone Number: 03/12/2023, 2:27 PM  Clinical Narrative:     Update 1517 CSW received follow up phone call from Kathlene November, she provided CSW with contact for [facility] Medicaid [place] office.   CSW reached out and  spoke with [facility] [place], [phone] to assist with changing pt's Medicaid. She said that she cannot do that on her end but stated the Medicaid Enrollment Broker could assist. She put CSW on 3-way phone call with [employer] [place] Enrollment Broker at [phone] to assist with changing the plan. Enrollment broker stated that pt going from Standard plan to [facility] Direct Medicaid cannot be done with them, but CSW would have to reach back out to [facility] at [phone]. CSW to reach out to DSS when able and begin process.     CSW reached out to Long term care placement Carmelina Peal, [phone], ext, [phone]. CSW left confidential voice mail with contact information to reach back out to CSW.    TOC will continue to follow.        Expected Discharge Plan and Services                                               Social Determinants of Health (SDOH) Interventions SDOH Screenings   Food Insecurity: No Food Insecurity (02/04/2023)  Housing: High Risk (02/04/2023)  Transportation Needs: No Transportation Needs (02/04/2023)  Utilities: Not At Risk (02/04/2023)  Tobacco Use: High Risk (02/03/2023)    Readmission Risk Interventions     No data to display

## 2023-03-12 NOTE — Progress Notes (Signed)
HD#37 Subjective:   Summary: Russell [name] is a 58 y.o. male with significant medical history of chronic HIV dementia, prior substance use disorder complicated by housing instability, initially admitted for acute encephalopathy due to sepsis, now resolved. 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 He is stable and currently awaiting placement to ALF given needs for 24 hour supervision pending Medicaid status change.   Overnight Events: None  Patient's sister came to visit yesterday and brought him some clothing. He has some confusion about his room and which room he is supposed to stay at. Per nursing this is not unusual. Asking about getting some food from outside hospital today. Denies any symptoms and states he is doing well.   Objective:  Vital signs in last 24 hours: Vitals:   03/11/23 1028 03/11/23 2100 03/12/23 0425 03/12/23 0827  BP: 109/65 129/74 (!) 134/96 128/76  Pulse: 92 96 99 95  Resp: 20 18 18 16   Temp: 98 F (36.7 C) 98.7 F (37.1 C) 97.8 F (36.6 C)   TempSrc: Oral Oral Oral   SpO2: 99% 98% 97% 100%  Weight:      Height:       Supplemental O2: Room Air SpO2: 100 %   Physical Exam:  Constitutional: Well appearing Pulmonary/Chest: normal work of breathing on room air  Neuro: Moving all limbs spontaneously.  Psych: Appropriate affect,  Filed Weights   02/03/23 1800  Weight: 57.8 kg     Pertinent Labs:    Latest Ref Rng & Units 03/09/2023    6:26 AM 03/02/2023    6:32 AM 02/16/2023    9:08 AM  CBC  WBC 4.0 - 10.5 K/uL 3.6  3.1  3.5   Hemoglobin 13.0 - 17.0 g/dL 16.1  09.6  04.5   Hematocrit 39.0 - 52.0 % 32.0  32.9  36.1   Platelets 150 - 400 K/uL 95  100  94        Latest Ref Rng & Units 03/09/2023    6:26 AM 03/02/2023    6:32 AM 02/23/2023    7:03 AM  CMP  Glucose 70 - 99 mg/dL 82  409  97   BUN 6 - 20 mg/dL 12  13  13    Creatinine 0.61 - 1.24 mg/dL 8.11  9.14  7.82   Sodium  135 - 145 mmol/L 132  133  132   Potassium 3.5 - 5.1 mmol/L 3.8  3.7  4.0   Chloride 98 - 111 mmol/L 107  108  106   CO2 22 - 32 mmol/L 20  19  19    Calcium 8.9 - 10.3 mg/dL 8.3  8.0  8.4     Assessment/Plan:   Principal Problem:   Pneumococcal bacteremia Active Problems:   Sepsis (HCC)   Fall   Encephalopathy   Community acquired pneumonia   Thrombocytopenia (HCC)   Hematuria   Homelessness   Alcoholism (HCC)   AIDS (HCC)   Positive RPR test   Cholelithiasis without cholecystitis   Aortic atherosclerosis (HCC)   Cerebral atrophy (HCC)   Horseshoe kidney   Malnutrition of moderate degree   HIV dementia (HCC)   HCV (hepatitis C virus)   Pulmonary hypertension (HCC)   Right ventricular systolic dysfunction   Biological false positive RPR test   Onychomycosis   Agitation   Late latent syphilis   HIV/AIDS Cognitive impairment c/f HIV Dementia History of Positive RPR Housing instability  Stable, pending dispo planning. - Appreciate TOC's guidance and support - Continue Zyprexa 5 mg in the Am and PM, and 10 mg at bedtime  - Continue Biktarvy for his HIV - Continue bactrim for PJP prophylaxis.  - Delirium precautions  - S/p treatment for suspected late latent syphilis    HCV Cirrhosis Thrombocytopenia No ascites or recent fevers. Stable - Will need outpatient follow up   Chronic hyponatremia Stable - Encourage PO intake - Weekly labs until discharge, next due on 2/17   Severely reduced right ventricular systolic fxn Pulmonary HTN No evidence of peripheral congestion. Stable. - Not a candidate for RH catheterization or advanced therapies  HTN: amlodipine 5 mg  HLD: rosuvastatin 5 mg    Diet: Normal IVF: None,None VTE: DOAC Code: Full PT/OT recs:  ALF  Dispo: Anticipated discharge to  SNF / Memory unit . Medically stable for discharge.   [name] MD Internal Medicine Resident PGY-1 Pager: [phone] Please contact the on call pager  after 5 pm and on weekends at [phone].

## 2023-03-13 DIAGNOSIS — F0280 Dementia in other diseases classified elsewhere without behavioral disturbance: Secondary | ICD-10-CM | POA: Diagnosis not present

## 2023-03-13 DIAGNOSIS — B953 Streptococcus pneumoniae as the cause of diseases classified elsewhere: Secondary | ICD-10-CM | POA: Diagnosis not present

## 2023-03-13 DIAGNOSIS — B20 Human immunodeficiency virus [HIV] disease: Secondary | ICD-10-CM | POA: Diagnosis not present

## 2023-03-13 DIAGNOSIS — R7881 Bacteremia: Secondary | ICD-10-CM | POA: Diagnosis not present

## 2023-03-13 NOTE — Plan of Care (Signed)

## 2023-03-13 NOTE — Progress Notes (Signed)
HD#38 Subjective:   Summary: Russell [name] is a 58 y.o. male with significant medical history of chronic HIV dementia, prior substance use disorder complicated by housing instability, initially admitted for acute encephalopathy due to sepsis, now resolved. 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 He is stable and currently awaiting placement to ALF given needs for 24 hour supervision pending Medicaid status change.   Overnight Events: None  Discussion yesterday with social work - he does have a bed offer and is waiting on insurance to get switched over so he may go.  This will take some time - perhaps weeks even. This was discussed with the patient. Patient overall states they are doing well and feeling good.  Objective:  Vital signs in last 24 hours: Vitals:   03/12/23 0827 03/12/23 1712 03/12/23 2044 03/13/23 0742  BP: 128/76 127/81 138/87 126/79  Pulse: 95 95  98  Resp: 16 18 18 19   Temp:   98.2 F (36.8 C) 98 F (36.7 C)  TempSrc:   Oral Oral  SpO2: 100% 99% 100% 98%  Weight:      Height:       Supplemental O2: Room Air SpO2: 98 %   Physical Exam:  Constitutional: Well appearing Pulmonary/Chest: normal work of breathing on room air  Neuro: Moving all limbs spontaneously.  MSK: Bandage applied to the LUE. Denies pain Psych: Appropriate affect.   Filed Weights   02/03/23 1800  Weight: 57.8 kg     Pertinent Labs:    Latest Ref Rng & Units 03/09/2023    6:26 AM 03/02/2023    6:32 AM 02/16/2023    9:08 AM  CBC  WBC 4.0 - 10.5 K/uL 3.6  3.1  3.5   Hemoglobin 13.0 - 17.0 g/dL 30.8  65.7  84.6   Hematocrit 39.0 - 52.0 % 32.0  32.9  36.1   Platelets 150 - 400 K/uL 95  100  94        Latest Ref Rng & Units 03/09/2023    6:26 AM 03/02/2023    6:32 AM 02/23/2023    7:03 AM  CMP  Glucose 70 - 99 mg/dL 82  962  97   BUN 6 - 20 mg/dL 12  13  13    Creatinine 0.61 - 1.24 mg/dL 9.52  8.41  3.24   Sodium  135 - 145 mmol/L 132  133  132   Potassium 3.5 - 5.1 mmol/L 3.8  3.7  4.0   Chloride 98 - 111 mmol/L 107  108  106   CO2 22 - 32 mmol/L 20  19  19    Calcium 8.9 - 10.3 mg/dL 8.3  8.0  8.4     Assessment/Plan:   Principal Problem:   Pneumococcal bacteremia Active Problems:   Sepsis (HCC)   Fall   Encephalopathy   Community acquired pneumonia   Thrombocytopenia (HCC)   Hematuria   Homelessness   Alcoholism (HCC)   AIDS (HCC)   Positive RPR test   Cholelithiasis without cholecystitis   Aortic atherosclerosis (HCC)   Cerebral atrophy (HCC)   Horseshoe kidney   Malnutrition of moderate degree   HIV dementia (HCC)   HCV (hepatitis C virus)   Pulmonary hypertension (HCC)   Right ventricular systolic dysfunction   Biological false positive RPR test   Onychomycosis   Agitation   Late latent syphilis   HIV/AIDS Cognitive impairment c/f HIV Dementia History of  Positive RPR Housing instability Stable, pending dispo planning. - Appreciate TOC's guidance and support - Continue Zyprexa 5 mg in the Am and PM, and 10 mg at bedtime  - Continue Biktarvy for his HIV - Continue bactrim for PJP prophylaxis.  - Delirium precautions  - S/p treatment for suspected late latent syphilis    HCV Cirrhosis Thrombocytopenia No ascites or recent fevers. Stable - Will need outpatient follow up   Chronic hyponatremia Stable - Encourage PO intake - Weekly labs until discharge, next due on 2/17   Severely reduced right ventricular systolic fxn Pulmonary HTN No evidence of peripheral congestion. Stable. - Not a candidate for RH catheterization or advanced therapies  HTN: amlodipine 5 mg  HLD: rosuvastatin 5 mg    Diet: Normal IVF: None,None VTE: DOAC Code: Full PT/OT recs:  ALF  Dispo: Anticipated discharge to  ALF / Memory unit . Medically stable for discharge.   [name] MD Internal Medicine Resident PGY-1 Pager: [phone] Please contact the on call pager  after 5 pm and on weekends at [phone].

## 2023-03-14 DIAGNOSIS — R7881 Bacteremia: Secondary | ICD-10-CM | POA: Diagnosis not present

## 2023-03-14 DIAGNOSIS — B953 Streptococcus pneumoniae as the cause of diseases classified elsewhere: Secondary | ICD-10-CM | POA: Diagnosis not present

## 2023-03-14 MED ORDER — CETAPHIL MOISTURIZING EX LOTN: TOPICAL_LOTION | Freq: Three times a day (TID) | CUTANEOUS | Status: DC | PRN

## 2023-03-14 MED ORDER — UREA 10 % EX LOTN: TOPICAL_LOTION | Freq: Three times a day (TID) | CUTANEOUS | Status: DC | PRN

## 2023-03-14 NOTE — Progress Notes (Addendum)
HD#[id number] Subjective:   Summary: Russell [name] is a 58 y.o. male with significant medical history of chronic HIV dementia, prior substance use disorder complicated by housing instability, initially admitted for acute encephalopathy due to sepsis, now resolved. 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 He is stable and currently awaiting placement to ALF given needs for 24 hour supervision pending Medicaid status change.   Overnight Events: None  Patient overall states they are doing well and feeling good. He is ready to go and is growing tired of being in hospital - which is understandable. He continues to be pleasant and appropriate.   Objective:  Vital signs in last 24 hours: Vitals:   03/13/23 0742 03/13/23 1544 03/13/23 2051 03/14/23 0753  BP: 126/79 126/78 132/75 (!) 145/86  Pulse: 98 87 94 95  Resp: 19 18 18 18   Temp: 98 F (36.7 C) 98.7 F (37.1 C)  98 F (36.7 C)  TempSrc: Oral Oral  Oral  SpO2: 98% 99% 100% 99%  Weight:      Height:       Supplemental O2: Room Air SpO2: 99 %   Physical Exam:  Constitutional: Well appearing Pulmonary/Chest: normal work of breathing on room air  Neuro: Moving all limbs spontaneously.  MSK: Bandage applied to the LUE. Denies pain Psych: Appropriate affect.   Filed Weights   02/03/23 1800  Weight: 57.8 kg     Pertinent Labs:    Latest Ref Rng & Units 03/09/2023    6:26 AM 03/02/2023    6:32 AM 02/16/2023    9:08 AM  CBC  WBC 4.0 - 10.5 K/uL 3.6  3.1  3.5   Hemoglobin 13.0 - 17.0 g/dL 09.8  11.9  14.7   Hematocrit 39.0 - 52.0 % 32.0  32.9  36.1   Platelets 150 - 400 K/uL 95  100  94        Latest Ref Rng & Units 03/09/2023    6:26 AM 03/02/2023    6:32 AM 02/23/2023    7:03 AM  CMP  Glucose 70 - 99 mg/dL 82  829  97   BUN 6 - 20 mg/dL 12  13  13    Creatinine 0.61 - 1.24 mg/dL 5.62  1.30  8.65   Sodium 135 - 145 mmol/L 132  133  132   Potassium 3.5 - 5.1  mmol/L 3.8  3.7  4.0   Chloride 98 - 111 mmol/L 107  108  106   CO2 22 - 32 mmol/L 20  19  19    Calcium 8.9 - 10.3 mg/dL 8.3  8.0  8.4     Assessment/Plan:   Principal Problem:   Pneumococcal bacteremia Active Problems:   Sepsis (HCC)   Fall   Encephalopathy   Community acquired pneumonia   Thrombocytopenia (HCC)   Hematuria   Homelessness   Alcoholism (HCC)   AIDS (HCC)   Positive RPR test   Cholelithiasis without cholecystitis   Aortic atherosclerosis (HCC)   Cerebral atrophy (HCC)   Horseshoe kidney   Malnutrition of moderate degree   HIV dementia (HCC)   HCV (hepatitis C virus)   Pulmonary hypertension (HCC)   Right ventricular systolic dysfunction   Biological false positive RPR test   Onychomycosis   Agitation   Late latent syphilis   HIV/AIDS Cognitive impairment c/f HIV Dementia History of Positive RPR Housing instability Stable, pending dispo planning. - Appreciate TOC's guidance  and support - Continue Zyprexa 5 mg in the Am and PM, and 10 mg at bedtime  - Continue Biktarvy for his HIV - Continue bactrim for PJP prophylaxis.  - Delirium precautions  - S/p treatment for suspected late latent syphilis    HCV Cirrhosis Thrombocytopenia No ascites or recent fevers. Stable - Will need outpatient follow up   Chronic hyponatremia Stable - Encourage PO intake - Weekly labs until discharge, next due on 2/17   Severely reduced right ventricular systolic fxn Pulmonary HTN No evidence of peripheral congestion. Stable. - Not a candidate for RH catheterization or advanced therapies  HTN: amlodipine 5 mg  HLD: rosuvastatin 5 mg    Diet: Normal IVF: None,None VTE: DOAC Code: Full PT/OT recs:  ALF  Dispo: Anticipated discharge to  ALF / Memory unit . Medically stable for discharge.   [name] MD Internal Medicine Resident PGY-1 Pager: [phone] Please contact the on call pager after 5 pm and on weekends at [phone].

## 2023-03-14 NOTE — Plan of Care (Signed)

## 2023-03-14 NOTE — Progress Notes (Signed)
   HD#39 SUBJECTIVE:  Patient Summary: Russell [name] is a 58 y.o. male with significant medical history of chronic HIV dementia, prior substance use disorder complicated by housing instability, initially admitted for acute encephalopathy due to sepsis, now resolved. 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 He is stable and currently awaiting placement to ALF given needs for 24 hour supervision pending Medicaid status change.   Overnight Events: NAEON  Interim History: Resting comfortably in bed, no complaints.  OBJECTIVE:  Vital Signs: Vitals:   03/13/23 0742 03/13/23 1544 03/13/23 2051 03/14/23 0753  BP: 126/79 126/78 132/75 (!) 145/86  Pulse: 98 87 94 95  Resp: 19 18 18 18   Temp: 98 F (36.7 C) 98.7 F (37.1 C)  98 F (36.7 C)  TempSrc: Oral Oral  Oral  SpO2: 98% 99% 100% 99%  Weight:      Height:       Supplemental O2: Room Air SpO2: 99 %  Filed Weights   02/03/23 1800  Weight: 57.8 kg     Intake/Output Summary (Last 24 hours) at 03/14/2023 1241 Last data filed at 03/14/2023 0811 Gross per 24 hour  Intake 120 ml  Output --  Net 120 ml   Net IO Since Admission: 40,766.41 mL [03/14/23 1241]  Physical Exam: Constitutional:Resting comfortably. In no acute distress. Normal work of breathing on room air. Neuro:No focal deficit noted. Psych:Pleasant mood and affect.  Patient Lines/Drains/Airways Status     Active Line/Drains/Airways     None            ASSESSMENT/PLAN:  Assessment: Principal Problem:   Pneumococcal bacteremia Active Problems:   Sepsis (HCC)   Fall   Encephalopathy   Community acquired pneumonia   Thrombocytopenia (HCC)   Hematuria   Homelessness   Alcoholism (HCC)   AIDS (HCC)   Positive RPR test   Cholelithiasis without cholecystitis   Aortic atherosclerosis (HCC)   Cerebral atrophy (HCC)   Horseshoe kidney   Malnutrition of moderate degree   HIV dementia  (HCC)   HCV (hepatitis C virus)   Pulmonary hypertension (HCC)   Right ventricular systolic dysfunction   Biological false positive RPR test   Onychomycosis   Agitation   Late latent syphilis   Plan: HIV/AIDS Cognitive impairment c/f HIV Dementia History of positive RPR Housing instability Stable, pending ongoing dispo planning. Plan: -Continue Zyprexa 5 mg in the morning and evening, and 10 mg at bedtime  -Continue Biktarvy 1 tablet daily -Continue bactrim 1 tabley daily -Delirium precautions     HCV Cirrhosis Thrombocytopenia Stable.  Plan: -Recommend outpatient follow up.   Chronic hyponatremia Stable. -Encourage PO intake -Weekly labs until discharge, next due on 2/17   Severely reduced right ventricular systolic function Pulmonary HTN Euvolemic. Not a candidate for [facility] catheterization or advanced therapies. Plan: -Monitor volume status   HTN Plan: -Continue amlodipine 5 mg daily  HLD Plan: -Continue rosuvastatin 5 mg daily  Best Practice: Diet: Regular diet IVF: None VTE: rivaroxaban (XARELTO) tablet 10 mg Start: 02/15/23 1000 Code: Full AB: None DISPO: Pending insurance/ALF acceptance.  Signature: Champ Mungo, D.O.  Internal Medicine Resident, PGY-2 [facility]  Pager: [phone]   Please contact the on call pager after 5 pm and on weekends at [phone].

## 2023-03-15 NOTE — Plan of Care (Signed)
 ?  Problem: Clinical Measurements: ?Goal: Ability to maintain clinical measurements within normal limits will improve ?Outcome: Progressing ?Goal: Will remain free from infection ?Outcome: Progressing ?Goal: Diagnostic test results will improve ?Outcome: Progressing ?  ?

## 2023-03-15 NOTE — Plan of Care (Signed)

## 2023-03-16 DIAGNOSIS — R7881 Bacteremia: Secondary | ICD-10-CM | POA: Diagnosis not present

## 2023-03-16 DIAGNOSIS — B953 Streptococcus pneumoniae as the cause of diseases classified elsewhere: Secondary | ICD-10-CM | POA: Diagnosis not present

## 2023-03-16 LAB — BASIC METABOLIC PANEL
Anion gap: 6 (ref 5–15)
BUN: 10 mg/dL (ref 6–20)
CO2: 18 mmol/L — ABNORMAL LOW (ref 22–32)
Calcium: 8.2 mg/dL — ABNORMAL LOW (ref 8.9–10.3)
Chloride: 110 mmol/L (ref 98–111)
Creatinine, Ser: 1.04 mg/dL (ref 0.61–1.24)
GFR, Estimated: >60 mL/min (ref >60)
Glucose, Bld: 80 mg/dL (ref 70–99)
Potassium: 3.7 mmol/L (ref 3.5–5.1)
Sodium: 134 mmol/L — ABNORMAL LOW (ref 135–145)

## 2023-03-16 LAB — CBC
HCT: 32 % — ABNORMAL LOW (ref 39.0–52.0)
Hemoglobin: 10.7 g/dL — ABNORMAL LOW (ref 13.0–17.0)
MCH: 33.9 pg (ref 26.0–34.0)
MCHC: 33.4 g/dL (ref 30.0–36.0)
MCV: 101.3 fL — ABNORMAL HIGH (ref 80.0–100.0)
Platelets: 108 10*3/uL — ABNORMAL LOW (ref 150–400)
RBC: 3.16 MIL/uL — ABNORMAL LOW (ref 4.22–5.81)
RDW: 15.7 % — ABNORMAL HIGH (ref 11.5–15.5)
WBC: 2.9 10*3/uL — ABNORMAL LOW (ref 4.0–10.5)
nRBC: 0 % (ref 0.0–0.2)

## 2023-03-16 LAB — MAGNESIUM: Magnesium: 1.8 mg/dL (ref 1.7–2.4)

## 2023-03-16 MED ORDER — ACETAMINOPHEN 325 MG PO TABS
650.0000 mg | ORAL_TABLET | Freq: Four times a day (QID) | ORAL | Status: DC | PRN
Start: 2023-03-16 — End: 2023-04-04

## 2023-03-16 NOTE — Progress Notes (Signed)
HD#41 Subjective:   Summary: Russell [name] is a 58 y.o. male with significant medical history of chronic HIV dementia, prior substance use disorder complicated by housing instability, initially admitted for acute encephalopathy due to sepsis, now resolved. 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 He is stable and currently awaiting placement to ALF given needs for 24 hour supervision pending Medicaid status change.   Overnight Events: None  Patient overall states they are doing well and feeling good. No concerns today. He continues to be pleasant and appropriate. Discussed that we are still working on [facility] for ALF placement.   Objective:  Vital signs in last 24 hours: Vitals:   03/15/23 1623 03/15/23 1934 03/16/23 0514 03/16/23 0831  BP: 119/79 (!) 140/74 115/67 116/85  Pulse: 92 91 88 91  Resp: 18 18 18 18   Temp: 97.8 F (36.6 C) 97.8 F (36.6 C) 98.3 F (36.8 C) 97.8 F (36.6 C)  TempSrc: Oral Oral  Oral  SpO2: 99% 100% 98%   Weight:   63.7 kg   Height:       Supplemental O2: Room Air SpO2: 98 %   Physical Exam:  Constitutional: Well appearing Pulmonary/Chest: normal work of breathing on room air  Neuro: Moving all limbs spontaneously.  MSK: Bandage applied to the LUE. Denies pain Psych: Appropriate affect.   Filed Weights   02/03/23 1800 03/16/23 0514  Weight: 57.8 kg 63.7 kg     Pertinent Labs:    Latest Ref Rng & Units 03/16/2023    6:47 AM 03/09/2023    6:26 AM 03/02/2023    6:32 AM  CBC  WBC 4.0 - 10.5 K/uL 2.9  3.6  3.1   Hemoglobin 13.0 - 17.0 g/dL 16.1  09.6  04.5   Hematocrit 39.0 - 52.0 % 32.0  32.0  32.9   Platelets 150 - 400 K/uL 108  95  100        Latest Ref Rng & Units 03/16/2023    6:47 AM 03/09/2023    6:26 AM 03/02/2023    6:32 AM  CMP  Glucose 70 - 99 mg/dL 80  82  409   BUN 6 - 20 mg/dL 10  12  13    Creatinine 0.61 - 1.24 mg/dL 8.11  9.14  7.82    Sodium 135 - 145 mmol/L 134  132  133   Potassium 3.5 - 5.1 mmol/L 3.7  3.8  3.7   Chloride 98 - 111 mmol/L 110  107  108   CO2 22 - 32 mmol/L 18  20  19    Calcium 8.9 - 10.3 mg/dL 8.2  8.3  8.0     Assessment/Plan:   Principal Problem:   Pneumococcal bacteremia Active Problems:   Sepsis (HCC)   Fall   Encephalopathy   Community acquired pneumonia   Thrombocytopenia (HCC)   Hematuria   Homelessness   Alcoholism (HCC)   AIDS (HCC)   Positive RPR test   Cholelithiasis without cholecystitis   Aortic atherosclerosis (HCC)   Cerebral atrophy (HCC)   Horseshoe kidney   Malnutrition of moderate degree   HIV dementia (HCC)   HCV (hepatitis C virus)   Pulmonary hypertension (HCC)   Right ventricular systolic dysfunction   Biological false positive RPR test   Onychomycosis   Agitation   Late latent syphilis   HIV/AIDS Cognitive impairment c/f HIV Dementia History of Positive RPR Housing instability Stable,  pending dispo planning. - Appreciate TOC's guidance and support - Continue Zyprexa 5 mg in the Am and PM, and 10 mg at bedtime  - Continue Biktarvy for his HIV - Continue bactrim for PJP prophylaxis.  - Delirium precautions  - S/p treatment for suspected late latent syphilis    HCV Cirrhosis Thrombocytopenia No ascites or recent fevers. Stable - Will need outpatient follow up   Chronic hyponatremia Stable - Encourage PO intake - Weekly labs until discharge, next due on 2/24  - Stable from prior today.    Severely reduced right ventricular systolic fxn Pulmonary HTN No evidence of peripheral congestion. Stable. - Not a candidate for RH catheterization or advanced therapies  HTN: amlodipine 5 mg  HLD: rosuvastatin 5 mg   Diet: Normal IVF: None,None VTE: DOAC Code: Full PT/OT recs:  ALF  Dispo: Anticipated discharge to  ALF / Memory unit . Medically stable for discharge.   [name] MD Internal Medicine Resident PGY-1 Pager:  [phone] Please contact the on call pager after 5 pm and on weekends at [phone].

## 2023-03-16 NOTE — Plan of Care (Signed)
 ?  Problem: Clinical Measurements: ?Goal: Ability to maintain clinical measurements within normal limits will improve ?Outcome: Progressing ?Goal: Will remain free from infection ?Outcome: Progressing ?Goal: Diagnostic test results will improve ?Outcome: Progressing ?  ?

## 2023-03-17 ENCOUNTER — Ambulatory Visit: Payer: Commercial Managed Care - HMO | Admitting: Internal Medicine

## 2023-03-17 DIAGNOSIS — B953 Streptococcus pneumoniae as the cause of diseases classified elsewhere: Secondary | ICD-10-CM | POA: Diagnosis not present

## 2023-03-17 DIAGNOSIS — R7881 Bacteremia: Secondary | ICD-10-CM | POA: Diagnosis not present

## 2023-03-17 DIAGNOSIS — B20 Human immunodeficiency virus [HIV] disease: Secondary | ICD-10-CM | POA: Diagnosis not present

## 2023-03-17 DIAGNOSIS — F0280 Dementia in other diseases classified elsewhere without behavioral disturbance: Secondary | ICD-10-CM | POA: Diagnosis not present

## 2023-03-17 NOTE — TOC Progression Note (Addendum)
Transition of Care [facility]) - Progression Note    Patient Details  Name: [name] MRN: [id number] Date of Birth: [birth year]  [facility] CM/SW Contact  Janae Bridgeman, RN Phone Number: 03/17/2023, 3:01 PM  Clinical Narrative:    CM called and spoke with Hydia, CM at [facility] and the facility has open availability for male bed.  The facility states that they are willing to review the patient's clinicals tomorrow - Clinicals were sent via secure email to [email or web address].  CM spoke with Hydia, CM and she states that she plans to have a phone interview with the patient on Friday, 03/20/2023 - after 10 pm.  Attending team notified that [facility] Team plans to explore options for placement since we have been unable to transition the patient's [facility] Medicaid to traditional [facility] insurance provider requested by the last accepting ALF facility - [facility].  TB skin test requested from the MD Team in hopes that we will obtain a ALF bed offer this week for possible placement next week.  I spoke with [place], MSW and she sent another email to [employer], Biochemist, clinical.  Raymondo Band, TOC Supervisor was included in this email since disability screening and application is needed to obtain payor source for ALF placement along with the patient's existing Medicaid.         Expected Discharge Plan and Services                                               Social Determinants of Health (SDOH) Interventions SDOH Screenings   Food Insecurity: No Food Insecurity (03/17/2023)  Housing: High Risk (03/17/2023)  Transportation Needs: No Transportation Needs (03/17/2023)  Utilities: At Risk (03/17/2023)  Tobacco Use: High Risk (02/03/2023)    Readmission Risk Interventions    03/17/2023    3:01 PM  Readmission Risk Prevention Plan  Transportation Screening Complete  PCP or Specialist Appt within 3-5 Days  Complete  HRI or Home Care Consult Complete  Social Work Consult for Recovery Care Planning/Counseling Complete  Palliative Care Screening Complete  Medication Review Oceanographer) Complete

## 2023-03-17 NOTE — Progress Notes (Signed)
HD#42 Subjective:   Summary: Russell [name] is a 58 y.o. male with significant medical history of chronic HIV dementia, prior substance use disorder complicated by housing instability, initially admitted for acute encephalopathy due to sepsis, now resolved. 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 He is stable and currently awaiting placement to ALF given needs for 24 hour supervision pending Medicaid status change.   Overnight Events: None  Patient loosened wrapping on L arm. No pain. Patient resting in bed at time of interview. Patient overall states they are doing well and feeling good. No concerns today.   Objective:  Vital signs in last 24 hours: Vitals:   03/16/23 1945 03/16/23 2213 03/17/23 0550 03/17/23 0900  BP: (!) 173/80 133/75 108/70 138/72  Pulse: (!) 115 96 96 97  Resp: 18 20 18 20   Temp: 97.9 F (36.6 C) 97.9 F (36.6 C) (!) 97.2 F (36.2 C) 97.9 F (36.6 C)  TempSrc: Oral Oral Oral Oral  SpO2: 97% 99% 99% 97%  Weight:      Height:       Supplemental O2: Room Air SpO2: 97 %   Physical Exam:  Constitutional: Well appearing Pulmonary/Chest: normal work of breathing on room air  Neuro: Moving all limbs spontaneously.  MSK: Bandage applied to the LUE. Denies pain Psych: Appropriate affect.   Filed Weights   02/03/23 1800 03/16/23 0514  Weight: 57.8 kg 63.7 kg     Pertinent Labs:    Latest Ref Rng & Units 03/16/2023    6:47 AM 03/09/2023    6:26 AM 03/02/2023    6:32 AM  CBC  WBC 4.0 - 10.5 K/uL 2.9  3.6  3.1   Hemoglobin 13.0 - 17.0 g/dL 16.1  09.6  04.5   Hematocrit 39.0 - 52.0 % 32.0  32.0  32.9   Platelets 150 - 400 K/uL 108  95  100        Latest Ref Rng & Units 03/16/2023    6:47 AM 03/09/2023    6:26 AM 03/02/2023    6:32 AM  CMP  Glucose 70 - 99 mg/dL 80  82  409   BUN 6 - 20 mg/dL 10  12  13    Creatinine 0.61 - 1.24 mg/dL 8.11  9.14  7.82   Sodium 135 - 145 mmol/L 134   132  133   Potassium 3.5 - 5.1 mmol/L 3.7  3.8  3.7   Chloride 98 - 111 mmol/L 110  107  108   CO2 22 - 32 mmol/L 18  20  19    Calcium 8.9 - 10.3 mg/dL 8.2  8.3  8.0     Assessment/Plan:   Principal Problem:   Pneumococcal bacteremia Active Problems:   Sepsis (HCC)   Fall   Encephalopathy   Community acquired pneumonia   Thrombocytopenia (HCC)   Hematuria   Homelessness   Alcoholism (HCC)   AIDS (HCC)   Positive RPR test   Cholelithiasis without cholecystitis   Aortic atherosclerosis (HCC)   Cerebral atrophy (HCC)   Horseshoe kidney   Malnutrition of moderate degree   HIV dementia (HCC)   HCV (hepatitis C virus)   Pulmonary hypertension (HCC)   Right ventricular systolic dysfunction   Biological false positive RPR test   Onychomycosis   Agitation   Late latent syphilis   HIV/AIDS Cognitive impairment c/f HIV Dementia History of Positive RPR Housing instability Stable, pending dispo planning. -  Appreciate TOC's guidance and support - Continue Zyprexa 5 mg in the Am and PM, and 10 mg at bedtime  - Continue Biktarvy for his HIV - Continue bactrim for PJP prophylaxis.  - Delirium precautions  - S/p treatment for suspected late latent syphilis    HCV Cirrhosis Thrombocytopenia No ascites or recent fevers. Stable - Will need outpatient follow up   Chronic hyponatremia Stable - Encourage PO intake - Weekly labs until discharge, next due on 2/24  - Stable from prior today.    Severely reduced right ventricular systolic fxn Pulmonary HTN No evidence of peripheral congestion. Stable. - Not a candidate for RH catheterization or advanced therapies  HTN: amlodipine 5 mg  HLD: rosuvastatin 5 mg   Diet: Normal IVF: None,None VTE: DOAC Code: Full PT/OT recs:  ALF  Dispo: Anticipated discharge to  ALF / Memory unit . Medically stable for discharge.   [name] MD Internal Medicine Resident PGY-1 Pager: [phone] Please contact the on call  pager after 5 pm and on weekends at [phone].

## 2023-03-17 NOTE — Plan of Care (Signed)
  Problem: Clinical Measurements: Goal: Ability to maintain clinical measurements within normal limits will improve 03/17/2023 0351 by Lisabeth Pick, RN Outcome: Progressing 03/17/2023 0351 by Lisabeth Pick, RN Outcome: Not Progressing Goal: Will remain free from infection 03/17/2023 0351 by Lisabeth Pick, RN Outcome: Progressing 03/17/2023 0351 by Lisabeth Pick, RN Outcome: Not Progressing Goal: Diagnostic test results will improve 03/17/2023 0351 by Lisabeth Pick, RN Outcome: Progressing 03/17/2023 0351 by Lisabeth Pick, RN Outcome: Not Progressing Goal: Respiratory complications will improve 03/17/2023 0351 by Lisabeth Pick, RN Outcome: Progressing 03/17/2023 0351 by Lisabeth Pick, RN Outcome: Not Progressing Goal: Cardiovascular complication will be avoided 03/17/2023 0351 by Lisabeth Pick, RN Outcome: Progressing 03/17/2023 0351 by Lisabeth Pick, RN Outcome: Not Progressing   Problem: Activity: Goal: Risk for activity intolerance will decrease 03/17/2023 0351 by Lisabeth Pick, RN Outcome: Progressing 03/17/2023 0351 by Lisabeth Pick, RN Outcome: Not Progressing   Problem: Nutrition: Goal: Adequate nutrition will be maintained 03/17/2023 0351 by Lisabeth Pick, RN Outcome: Progressing 03/17/2023 0351 by Lisabeth Pick, RN Outcome: Not Progressing   Problem: Elimination: Goal: Will not experience complications related to bowel motility 03/17/2023 0351 by Lisabeth Pick, RN Outcome: Progressing 03/17/2023 0351 by Lisabeth Pick, RN Outcome: Not Progressing Goal: Will not experience complications related to urinary retention 03/17/2023 0351 by Lisabeth Pick, RN Outcome: Progressing 03/17/2023 0351 by Lisabeth Pick, RN Outcome: Not Progressing   Problem: Pain Management: Goal: General experience of comfort will improve 03/17/2023 0351 by Lisabeth Pick, RN Outcome: Progressing 03/17/2023 0351 by Lisabeth Pick, RN Outcome:  Not Progressing   Problem: Safety: Goal: Ability to remain free from injury will improve 03/17/2023 0351 by Lisabeth Pick, RN Outcome: Progressing 03/17/2023 0351 by Lisabeth Pick, RN Outcome: Not Progressing   Problem: Skin Integrity: Goal: Risk for impaired skin integrity will decrease 03/17/2023 0351 by Lisabeth Pick, RN Outcome: Progressing 03/17/2023 0351 by Lisabeth Pick, RN Outcome: Not Progressing
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
## 2023-03-18 DIAGNOSIS — R7881 Bacteremia: Secondary | ICD-10-CM | POA: Diagnosis not present

## 2023-03-18 DIAGNOSIS — B953 Streptococcus pneumoniae as the cause of diseases classified elsewhere: Secondary | ICD-10-CM | POA: Diagnosis not present

## 2023-03-18 NOTE — Progress Notes (Signed)
Patient very pleasant this shift, no acute distress, walked independency in the hallway with supervision.

## 2023-03-18 NOTE — Progress Notes (Signed)
HD#43 Subjective:   Summary: Russell [name] is a 58 y.o. male with significant medical history of chronic HIV dementia, prior substance use disorder complicated by housing instability, initially admitted for acute encephalopathy due to sepsis, now resolved. 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 He is stable and currently awaiting placement to ALF given needs for 24 hour supervision pending Medicaid status change.   Overnight Events: None  No concerns today. Will interview for alternative placement location this Friday. Discussed with patient and sister today. TB testing pending for possible ALF placement. Will reach out to ID regarding missed HIV appointment   Objective:  Vital signs in last 24 hours: Vitals:   03/17/23 2033 03/18/23 0459 03/18/23 0818 03/18/23 0827  BP: (!) 146/77 131/75 117/84 117/84  Pulse: 97 93 96   Resp: 18 18 17    Temp: 98.3 F (36.8 C) 98.2 F (36.8 C) 98.8 F (37.1 C)   TempSrc:      SpO2:  99% 96%   Weight:      Height:       Supplemental O2: Room Air SpO2: 96 %   Physical Exam:  Constitutional: Well appearing Pulmonary/Chest: normal work of breathing on room air  Neuro: Moving all limbs spontaneously.  MSK: Bandage applied to the LUE. Denies pain Psych: Appropriate affect.   Filed Weights   02/03/23 1800 03/16/23 0514  Weight: 57.8 kg 63.7 kg     Pertinent Labs:    Latest Ref Rng & Units 03/16/2023    6:47 AM 03/09/2023    6:26 AM 03/02/2023    6:32 AM  CBC  WBC 4.0 - 10.5 K/uL 2.9  3.6  3.1   Hemoglobin 13.0 - 17.0 g/dL 91.4  78.2  95.6   Hematocrit 39.0 - 52.0 % 32.0  32.0  32.9   Platelets 150 - 400 K/uL 108  95  100        Latest Ref Rng & Units 03/16/2023    6:47 AM 03/09/2023    6:26 AM 03/02/2023    6:32 AM  CMP  Glucose 70 - 99 mg/dL 80  82  213   BUN 6 - 20 mg/dL 10  12  13    Creatinine 0.61 - 1.24 mg/dL 0.86  5.78  4.69   Sodium 135 - 145 mmol/L 134   132  133   Potassium 3.5 - 5.1 mmol/L 3.7  3.8  3.7   Chloride 98 - 111 mmol/L 110  107  108   CO2 22 - 32 mmol/L 18  20  19    Calcium 8.9 - 10.3 mg/dL 8.2  8.3  8.0     Assessment/Plan:   Principal Problem:   Pneumococcal bacteremia Active Problems:   Sepsis (HCC)   Fall   Encephalopathy   Community acquired pneumonia   Thrombocytopenia (HCC)   Hematuria   Homelessness   Alcoholism (HCC)   AIDS (HCC)   Positive RPR test   Cholelithiasis without cholecystitis   Aortic atherosclerosis (HCC)   Cerebral atrophy (HCC)   Horseshoe kidney   Malnutrition of moderate degree   HIV dementia (HCC)   HCV (hepatitis C virus)   Pulmonary hypertension (HCC)   Right ventricular systolic dysfunction   Biological false positive RPR test   Onychomycosis   Agitation   Late latent syphilis   HIV/AIDS Cognitive impairment c/f HIV Dementia History of Positive RPR Housing instability Stable, pending dispo planning. -  Appreciate TOC's guidance and support - Continue Zyprexa 5 mg in the Am and PM, and 10 mg at bedtime  - Continue Biktarvy for his HIV - Continue bactrim for PJP prophylaxis.  - Delirium precautions  - S/p treatment for suspected late latent syphilis - Will curbside ID regarding HIV follow up recs - Quant-gold collected  HCV Cirrhosis Thrombocytopenia No ascites or recent fevers. Stable - Will need outpatient follow up   Chronic hyponatremia Stable - Encourage PO intake - Weekly labs until discharge, next due on 2/24  - Stable from prior today.    Severely reduced right ventricular systolic fxn Pulmonary HTN No evidence of peripheral congestion. Stable. - Not a candidate for RH catheterization or advanced therapies  HTN: amlodipine 5 mg  HLD: rosuvastatin 5 mg   Diet: Normal IVF: None,None VTE: DOAC Code: Full PT/OT recs:  ALF  Dispo: Anticipated discharge to  ALF / Memory unit . Medically stable for discharge.   [name] MD Internal  Medicine Resident PGY-1 Pager: [phone] Please contact the on call pager after 5 pm and on weekends at [phone].

## 2023-03-18 NOTE — TOC Progression Note (Signed)
Transition of Care [facility]) - Progression Note    Patient Details  Name: Russell [name] MRN: [id number] Date of Birth: [birth year]  Transition of Care [facility]) CM/SW Contact  Janae Bridgeman, RN Phone Number: 03/18/2023, 2:45 PM  Clinical Narrative:    Cm met with the patient at the bedside and he is aware that bedside phone interview would be conducted with [facility] on Friday.  The patient was agreeable to the interview and expressed hope in being able to admit to a home outside of the hospital's care in the neat future.        Expected Discharge Plan and Services                                               Social Determinants of Health (SDOH) Interventions SDOH Screenings   Food Insecurity: No Food Insecurity (03/17/2023)  Housing: High Risk (03/17/2023)  Transportation Needs: No Transportation Needs (03/17/2023)  Utilities: At Risk (03/17/2023)  Tobacco Use: High Risk (02/03/2023)    Readmission Risk Interventions    03/17/2023    3:01 PM  Readmission Risk Prevention Plan  Transportation Screening Complete  PCP or Specialist Appt within 3-5 Days Complete  HRI or Home Care Consult Complete  Social Work Consult for Recovery Care Planning/Counseling Complete  Palliative Care Screening Complete  Medication Review Oceanographer) Complete

## 2023-03-18 NOTE — Plan of Care (Signed)

## 2023-03-19 DIAGNOSIS — B953 Streptococcus pneumoniae as the cause of diseases classified elsewhere: Secondary | ICD-10-CM | POA: Diagnosis not present

## 2023-03-19 DIAGNOSIS — R7881 Bacteremia: Secondary | ICD-10-CM | POA: Diagnosis not present

## 2023-03-19 LAB — COMPREHENSIVE METABOLIC PANEL
ALT: 25 U/L (ref 0–44)
AST: 56 U/L — ABNORMAL HIGH (ref 15–41)
Albumin: 2 g/dL — ABNORMAL LOW (ref 3.5–5.0)
Alkaline Phosphatase: 80 U/L (ref 38–126)
Anion gap: 9 (ref 5–15)
BUN: 11 mg/dL (ref 6–20)
CO2: 17 mmol/L — ABNORMAL LOW (ref 22–32)
Calcium: 9 mg/dL (ref 8.9–10.3)
Chloride: 109 mmol/L (ref 98–111)
Creatinine, Ser: 1 mg/dL (ref 0.61–1.24)
GFR, Estimated: >60 mL/min (ref >60)
Glucose, Bld: 100 mg/dL — ABNORMAL HIGH (ref 70–99)
Potassium: 3.9 mmol/L (ref 3.5–5.1)
Sodium: 135 mmol/L (ref 135–145)
Total Bilirubin: 0.9 mg/dL (ref 0.0–1.2)
Total Protein: 8.2 g/dL — ABNORMAL HIGH (ref 6.5–8.1)

## 2023-03-19 LAB — CBC
HCT: 31.5 % — ABNORMAL LOW (ref 39.0–52.0)
Hemoglobin: 10.6 g/dL — ABNORMAL LOW (ref 13.0–17.0)
MCH: 34.3 pg — ABNORMAL HIGH (ref 26.0–34.0)
MCHC: 33.7 g/dL (ref 30.0–36.0)
MCV: 101.9 fL — ABNORMAL HIGH (ref 80.0–100.0)
Platelets: 112 10*3/uL — ABNORMAL LOW (ref 150–400)
RBC: 3.09 MIL/uL — ABNORMAL LOW (ref 4.22–5.81)
RDW: 15.9 % — ABNORMAL HIGH (ref 11.5–15.5)
WBC: 3.8 10*3/uL — ABNORMAL LOW (ref 4.0–10.5)
nRBC: 0 % (ref 0.0–0.2)

## 2023-03-19 LAB — T-HELPER CELLS (CD4) COUNT (NOT AT ARMC)
CD4 % Helper T Cell: 13 % — ABNORMAL LOW (ref 33–65)
CD4 T Cell Abs: 167 /uL — ABNORMAL LOW (ref 400–1790)

## 2023-03-19 NOTE — Progress Notes (Signed)
HD#44 Subjective:   Summary: Russell [name] is a 58 y.o. male with significant medical history of chronic HIV dementia, prior substance use disorder complicated by housing instability, initially admitted for acute encephalopathy due to sepsis, now resolved. 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 He is stable and currently awaiting placement to ALF given needs for 24 hour supervision pending Medicaid status change.   Overnight Events: None  No concerns today. Will interview for alternative placement location this Friday. At baseline, maybe a bit less confused than usual. Continues to be pleasant and appropriate.   Objective:  Vital signs in last 24 hours: Vitals:   03/18/23 2057 03/19/23 0457 03/19/23 0847 03/19/23 0849  BP: 136/77 136/88 134/82 133/74  Pulse: (!) 107 94 94 86  Resp: 18 18 18 16   Temp: 98.3 F (36.8 C) (!) 97.4 F (36.3 C) 98.5 F (36.9 C) 98 F (36.7 C)  TempSrc:      SpO2: 96% 100% 100% 100%  Weight:      Height:       Supplemental O2: Room Air SpO2: 100 %   Physical Exam:  Constitutional: Well appearing Pulmonary/Chest: normal work of breathing on room air  Neuro: Moving all limbs spontaneously.  MSK: Bandage applied to the LUE. Denies pain Psych: Appropriate affect.   Filed Weights   02/03/23 1800 03/16/23 0514  Weight: 57.8 kg 63.7 kg     Pertinent Labs:    Latest Ref Rng & Units 03/16/2023    6:47 AM 03/09/2023    6:26 AM 03/02/2023    6:32 AM  CBC  WBC 4.0 - 10.5 K/uL 2.9  3.6  3.1   Hemoglobin 13.0 - 17.0 g/dL 96.0  45.4  09.8   Hematocrit 39.0 - 52.0 % 32.0  32.0  32.9   Platelets 150 - 400 K/uL 108  95  100        Latest Ref Rng & Units 03/16/2023    6:47 AM 03/09/2023    6:26 AM 03/02/2023    6:32 AM  CMP  Glucose 70 - 99 mg/dL 80  82  119   BUN 6 - 20 mg/dL 10  12  13    Creatinine 0.61 - 1.24 mg/dL 1.47  8.29  5.62   Sodium 135 - 145 mmol/L 134  132  133    Potassium 3.5 - 5.1 mmol/L 3.7  3.8  3.7   Chloride 98 - 111 mmol/L 110  107  108   CO2 22 - 32 mmol/L 18  20  19    Calcium 8.9 - 10.3 mg/dL 8.2  8.3  8.0     Assessment/Plan:   Principal Problem:   Pneumococcal bacteremia Active Problems:   Sepsis (HCC)   Fall   Encephalopathy   Community acquired pneumonia   Thrombocytopenia (HCC)   Hematuria   Homelessness   Alcoholism (HCC)   AIDS (HCC)   Positive RPR test   Cholelithiasis without cholecystitis   Aortic atherosclerosis (HCC)   Cerebral atrophy (HCC)   Horseshoe kidney   Malnutrition of moderate degree   HIV dementia (HCC)   HCV (hepatitis C virus)   Pulmonary hypertension (HCC)   Right ventricular systolic dysfunction   Biological false positive RPR test   Onychomycosis   Agitation   Late latent syphilis   HIV/AIDS Stable, pending dispo planning. - HIV RNA and CD4 count ordered.  - Continue Biktarvy for his HIV -  Continue bactrim for PJP prophylaxis.   Housing instability Cognitive impairment c/f HIV Dementia - Quant-gold collected - Appreciate TOC's guidance and support - Continue Zyprexa 5 mg in the Am and PM, and 10 mg at bedtime   HCV Cirrhosis Thrombocytopenia No ascites or recent fevers. Stable - Will need outpatient follow up   Chronic hyponatremia Stable - Weekly labs   Severely reduced right ventricular systolic fxn Pulmonary HTN No evidence of peripheral congestion. Stable.  HTN: amlodipine 5 mg  HLD: rosuvastatin 5 mg   Diet: Normal IVF: None,None VTE: DOAC Code: Full PT/OT recs:  ALF  Dispo: Anticipated discharge to  ALF / Memory unit . Medically stable for discharge.   [name] MD Internal Medicine Resident PGY-1 Pager: [phone] Please contact the on call pager after 5 pm and on weekends at [phone].

## 2023-03-19 NOTE — Plan of Care (Signed)
  Problem: Activity: Goal: Risk for activity intolerance will decrease Outcome: Progressing   Problem: Nutrition: Goal: Adequate nutrition will be maintained Outcome: Progressing   Problem: Coping: Goal: Level of anxiety will decrease Outcome: Progressing   Problem: Elimination: Goal: Will not experience complications related to bowel motility Outcome: Progressing   Problem: Pain Management: Goal: General experience of comfort will improve Outcome: Progressing   Problem: Safety: Goal: Ability to remain free from injury will improve Outcome: Progressing

## 2023-03-19 NOTE — Plan of Care (Signed)

## 2023-03-20 DIAGNOSIS — B20 Human immunodeficiency virus [HIV] disease: Secondary | ICD-10-CM | POA: Diagnosis not present

## 2023-03-20 DIAGNOSIS — B953 Streptococcus pneumoniae as the cause of diseases classified elsewhere: Secondary | ICD-10-CM | POA: Diagnosis not present

## 2023-03-20 DIAGNOSIS — R7881 Bacteremia: Secondary | ICD-10-CM | POA: Diagnosis not present

## 2023-03-20 DIAGNOSIS — F0280 Dementia in other diseases classified elsewhere without behavioral disturbance: Secondary | ICD-10-CM | POA: Diagnosis not present

## 2023-03-20 LAB — HIV-1 RNA QUANT-NO REFLEX-BLD
HIV 1 RNA Quant: 150 {copies}/mL
LOG10 HIV-1 RNA: 2.176 {Log}

## 2023-03-20 NOTE — TOC Progression Note (Addendum)
Transition of Care [facility]) - Progression Note    Patient Details  Name: Russell [name] MRN: [id number] Date of Birth: 25-Aug-1965  Transition of Care [facility]) CM/SW Contact  Janae Bridgeman, RN Phone Number: 03/20/2023, 12:22 PM  Clinical Narrative:    CM had phone interview with the patient at the bedside and [facility] was unable to offer a bed to the patient since they are not a locked facility and patient verbalized to the facility that he didn't want to be in a facility and wanted to work in the community and earn money doing odd jobs with [employer].  Patient is a flight risk and the ALF declined since they are a locked facility.  I called and left a detailed message with the patient's sister to see if patient was able to go home.  In the voicemail I asked that she visit with the patient in the next week to see, first hand, his improvement medically and psychologically to see if she would accept having him return to her home.  Patient may be a candidate for [facility] [employer] to follow in the community.        Expected Discharge Plan and Services                                               Social Determinants of Health (SDOH) Interventions SDOH Screenings   Food Insecurity: No Food Insecurity (03/17/2023)  Housing: High Risk (03/17/2023)  Transportation Needs: No Transportation Needs (03/17/2023)  Utilities: At Risk (03/17/2023)  Tobacco Use: High Risk (02/03/2023)    Readmission Risk Interventions    03/17/2023    3:01 PM  Readmission Risk Prevention Plan  Transportation Screening Complete  PCP or Specialist Appt within 3-5 Days Complete  HRI or Home Care Consult Complete  Social Work Consult for Recovery Care Planning/Counseling Complete  Palliative Care Screening Complete  Medication Review Oceanographer) Complete

## 2023-03-20 NOTE — Plan of Care (Signed)

## 2023-03-20 NOTE — Progress Notes (Signed)
HD#45 Subjective:   Summary: Russell [name] is a 58 y.o. male with significant medical history of chronic HIV dementia, prior substance use disorder complicated by housing instability, initially admitted for acute encephalopathy due to sepsis, now resolved. 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 He is stable and currently awaiting placement to ALF given needs for 24 hour supervision pending Medicaid status change.   Overnight Events: None  No concerns today. Will interview with alternative ALF today. Feeling well, no pain. In usual state of health.   Objective:  Vital signs in last 24 hours: Vitals:   03/19/23 0849 03/19/23 2040 03/20/23 0614 03/20/23 0821  BP: 133/74 139/87 126/73 128/79  Pulse: 86 96 91 88  Resp: 16 18 18 16   Temp: 98 F (36.7 C) 98 F (36.7 C) 97.8 F (36.6 C) 98 F (36.7 C)  TempSrc: Oral Oral Oral Oral  SpO2: 100% 98% 99% 100%  Weight:      Height:       Supplemental O2: Room Air SpO2: 100 %   Physical Exam:  Constitutional: Well appearing Pulmonary/Chest: normal work of breathing on room air  Neuro: Moving all limbs spontaneously.  MSK: Bandage applied to the LUE. Denies pain Psych: Appropriate affect.   Filed Weights   02/03/23 1800 03/16/23 0514  Weight: 57.8 kg 63.7 kg     Pertinent Labs:    Latest Ref Rng & Units 03/19/2023   11:03 AM 03/16/2023    6:47 AM 03/09/2023    6:26 AM  CBC  WBC 4.0 - 10.5 K/uL 3.8  2.9  3.6   Hemoglobin 13.0 - 17.0 g/dL 40.1  02.7  25.3   Hematocrit 39.0 - 52.0 % 31.5  32.0  32.0   Platelets 150 - 400 K/uL 112  108  95        Latest Ref Rng & Units 03/19/2023   11:03 AM 03/16/2023    6:47 AM 03/09/2023    6:26 AM  CMP  Glucose 70 - 99 mg/dL 664  80  82   BUN 6 - 20 mg/dL 11  10  12    Creatinine 0.61 - 1.24 mg/dL 4.03  4.74  2.59   Sodium 135 - 145 mmol/L 135  134  132   Potassium 3.5 - 5.1 mmol/L 3.9  3.7  3.8   Chloride 98 - 111  mmol/L 109  110  107   CO2 22 - 32 mmol/L 17  18  20    Calcium 8.9 - 10.3 mg/dL 9.0  8.2  8.3   Total Protein 6.5 - 8.1 g/dL 8.2     Total Bilirubin 0.0 - 1.2 mg/dL 0.9     Alkaline Phos 38 - 126 U/L 80     AST 15 - 41 U/L 56     ALT 0 - 44 U/L 25       Assessment/Plan:   Principal Problem:   Pneumococcal bacteremia Active Problems:   Sepsis (HCC)   Fall   Encephalopathy   Community acquired pneumonia   Thrombocytopenia (HCC)   Hematuria   Homelessness   Alcoholism (HCC)   AIDS (HCC)   Positive RPR test   Cholelithiasis without cholecystitis   Aortic atherosclerosis (HCC)   Cerebral atrophy (HCC)   Horseshoe kidney   Malnutrition of moderate degree   HIV dementia (HCC)   HCV (hepatitis C virus)   Pulmonary hypertension (HCC)   Right ventricular systolic  dysfunction   Biological false positive RPR test   Onychomycosis   Agitation   Late latent syphilis   HIV/AIDS Stable, pending dispo planning. - HIV RNA pending - CD4 count ordered - slightly improved. Still <200, will continue PJP prophylaxis. - Continue Biktarvy - Continue bactrim for PJP prophylaxis.   Housing instability Cognitive impairment c/f HIV Dementia - Quant-gold collected - pending - Appreciate TOC's guidance and support - Continue Zyprexa 5 mg in the Am and PM, and 10 mg at bedtime - Was interviewed for ALF today, but did not qualify due elopement concerns.   HCV Cirrhosis Thrombocytopenia No ascites or recent fevers. Stable - Will need outpatient follow up   Chronic hyponatremia Stable - Weekly labs   Severely reduced right ventricular systolic fxn Pulmonary HTN No evidence of peripheral congestion. Stable.  HTN: amlodipine 5 mg  HLD: rosuvastatin 5 mg   Diet: Normal IVF: None,None VTE: DOAC Code: Full PT/OT recs:  ALF  Dispo: Anticipated discharge to  ALF / Memory unit . Medically stable for discharge.   [name] MD Internal Medicine Resident PGY-1 Pager:  [phone] Please contact the on call pager after 5 pm and on weekends at [phone].

## 2023-03-21 DIAGNOSIS — B953 Streptococcus pneumoniae as the cause of diseases classified elsewhere: Secondary | ICD-10-CM | POA: Diagnosis not present

## 2023-03-21 DIAGNOSIS — R7881 Bacteremia: Secondary | ICD-10-CM | POA: Diagnosis not present

## 2023-03-21 NOTE — Progress Notes (Signed)
 HD#46 Subjective:   Summary: Russell [name] is a 58 y.o. male with significant medical history of chronic HIV dementia, prior substance use disorder complicated by housing instability, initially admitted for acute encephalopathy due to sepsis, now resolved. 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 He is stable and currently awaiting placement to ALF given needs for 24 hour supervision pending Medicaid status change.   Overnight Events: None  Assessed for ALF yesterday, but denied due to flight risk. Unclear what the plan will be for next steps. Seems to be more confused today, reflecting on some of the topics from yesterdays interview -ie leaving hospital, making money, etc.   Objective:  Vital signs in last 24 hours: Vitals:   03/20/23 0821 03/20/23 2129 03/21/23 0553 03/21/23 0921  BP: 128/79 131/83 114/69 118/67  Pulse: 88 91 88 96  Resp: 16 18 17 18   Temp: 98 F (36.7 C) 98.1 F (36.7 C) 98.6 F (37 C) 98.4 F (36.9 C)  TempSrc: Oral Oral Oral Oral  SpO2: 100% 99% 100% 99%  Weight:      Height:       Supplemental O2: Room Air SpO2: 99 %   Physical Exam:  Constitutional: Well appearing Pulmonary/Chest: normal work of breathing on room air  Neuro: Moving all limbs spontaneously.  MSK: Bandage applied to the LUE. Denies pain Psych: Appropriate affect.   Filed Weights   02/03/23 1800 03/16/23 0514  Weight: 57.8 kg 63.7 kg     Pertinent Labs:    Latest Ref Rng & Units 03/19/2023   11:03 AM 03/16/2023    6:47 AM 03/09/2023    6:26 AM  CBC  WBC 4.0 - 10.5 K/uL 3.8  2.9  3.6   Hemoglobin 13.0 - 17.0 g/dL 16.1  09.6  04.5   Hematocrit 39.0 - 52.0 % 31.5  32.0  32.0   Platelets 150 - 400 K/uL 112  108  95        Latest Ref Rng & Units 03/19/2023   11:03 AM 03/16/2023    6:47 AM 03/09/2023    6:26 AM  CMP  Glucose 70 - 99 mg/dL 409  80  82   BUN 6 - 20 mg/dL 11  10  12    Creatinine 0.61 - 1.24  mg/dL 8.11  9.14  7.82   Sodium 135 - 145 mmol/L 135  134  132   Potassium 3.5 - 5.1 mmol/L 3.9  3.7  3.8   Chloride 98 - 111 mmol/L 109  110  107   CO2 22 - 32 mmol/L 17  18  20    Calcium 8.9 - 10.3 mg/dL 9.0  8.2  8.3   Total Protein 6.5 - 8.1 g/dL 8.2     Total Bilirubin 0.0 - 1.2 mg/dL 0.9     Alkaline Phos 38 - 126 U/L 80     AST 15 - 41 U/L 56     ALT 0 - 44 U/L 25       Assessment/Plan:   Principal Problem:   Pneumococcal bacteremia Active Problems:   Sepsis (HCC)   Fall   Encephalopathy   Community acquired pneumonia   Thrombocytopenia (HCC)   Hematuria   Homelessness   Alcoholism (HCC)   AIDS (HCC)   Positive RPR test   Cholelithiasis without cholecystitis   Aortic atherosclerosis (HCC)   Cerebral atrophy (HCC)   Horseshoe kidney   Malnutrition of moderate  degree   HIV dementia (HCC)   HCV (hepatitis C virus)   Pulmonary hypertension (HCC)   Right ventricular systolic dysfunction   Biological false positive RPR test   Onychomycosis   Agitation   Late latent syphilis   HIV/AIDS Stable, pending dispo planning. - HIV RNA slightly improved  - CD4 count ordered - slightly improved. Still <200, will continue PJP prophylaxis.  - Will discuss with ID  - Continue Biktarvy - Continue bactrim for PJP prophylaxis.   Housing instability Cognitive impairment c/f HIV Dementia - Quant-gold collected - pending - Appreciate TOC's guidance and support - Continue Zyprexa 5 mg in the Am and PM, and 10 mg at bedtime - Was interviewed for ALF today, but did not qualify due elopement concerns.   HCV Cirrhosis Thrombocytopenia No ascites or recent fevers. Stable - Will need outpatient follow up   Chronic hyponatremia Stable - Weekly labs   Severely reduced right ventricular systolic fxn Pulmonary HTN No evidence of peripheral congestion. Stable.  HTN: amlodipine 5 mg  HLD: rosuvastatin 5 mg   Diet: Normal IVF: None,None VTE: DOAC Code: Full PT/OT recs:   ALF  Dispo: Anticipated discharge to  ALF / Memory unit . Medically stable for discharge.   [name] MD Internal Medicine Resident PGY-1 Pager: [phone] Please contact the on call pager after 5 pm and on weekends at [phone].

## 2023-03-21 NOTE — Plan of Care (Signed)

## 2023-03-22 DIAGNOSIS — R7881 Bacteremia: Secondary | ICD-10-CM | POA: Diagnosis not present

## 2023-03-22 DIAGNOSIS — B953 Streptococcus pneumoniae as the cause of diseases classified elsewhere: Secondary | ICD-10-CM | POA: Diagnosis not present

## 2023-03-22 LAB — QUANTIFERON-TB GOLD PLUS (RQFGPL)
QuantiFERON Mitogen Value: 0.77 [IU]/mL
QuantiFERON Nil Value: 0.06 [IU]/mL
QuantiFERON TB1 Ag Value: 0.05 [IU]/mL
QuantiFERON TB2 Ag Value: 0.05 [IU]/mL

## 2023-03-22 LAB — QUANTIFERON-TB GOLD PLUS: QuantiFERON-TB Gold Plus: NEGATIVE

## 2023-03-22 NOTE — Progress Notes (Signed)
 HD#47 Subjective:   Summary: Russell [name] is a 58 y.o. male with significant medical history of chronic HIV dementia, prior substance use disorder complicated by housing instability, initially admitted for acute encephalopathy due to sepsis, now resolved. 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 He is stable and currently awaiting placement to ALF given needs for 24 hour supervision pending Medicaid status change.   Overnight Events: None  Doing well today. No acute concerns. At baseline level of confusion. We will reach out to SW and Case manager to discuss placement again on Monday   Objective:  Vital signs in last 24 hours: Vitals:   03/21/23 0921 03/21/23 1657 03/21/23 2032 03/22/23 0532  BP: 118/67 130/62 130/69 131/87  Pulse: 96 97 93 89  Resp: 18 18 18 18   Temp: 98.4 F (36.9 C)  98.6 F (37 C) (!) 97.5 F (36.4 C)  TempSrc: Oral  Oral Oral  SpO2: 99% 99% 99% 100%  Weight:      Height:       Supplemental O2: Room Air SpO2: 100 %   Physical Exam:  Constitutional: Well appearing Pulmonary/Chest: normal work of breathing on room air  Neuro: Moving all limbs spontaneously.  MSK: Bandage applied to the LUE. Denies pain Psych: Appropriate affect.   Filed Weights   02/03/23 1800 03/16/23 0514  Weight: 57.8 kg 63.7 kg     Pertinent Labs:    Latest Ref Rng & Units 03/19/2023   11:03 AM 03/16/2023    6:47 AM 03/09/2023    6:26 AM  CBC  WBC 4.0 - 10.5 K/uL 3.8  2.9  3.6   Hemoglobin 13.0 - 17.0 g/dL 65.7  84.6  96.2   Hematocrit 39.0 - 52.0 % 31.5  32.0  32.0   Platelets 150 - 400 K/uL 112  108  95        Latest Ref Rng & Units 03/19/2023   11:03 AM 03/16/2023    6:47 AM 03/09/2023    6:26 AM  CMP  Glucose 70 - 99 mg/dL 952  80  82   BUN 6 - 20 mg/dL 11  10  12    Creatinine 0.61 - 1.24 mg/dL 8.41  3.24  4.01   Sodium 135 - 145 mmol/L 135  134  132   Potassium 3.5 - 5.1 mmol/L 3.9  3.7  3.8    Chloride 98 - 111 mmol/L 109  110  107   CO2 22 - 32 mmol/L 17  18  20    Calcium 8.9 - 10.3 mg/dL 9.0  8.2  8.3   Total Protein 6.5 - 8.1 g/dL 8.2     Total Bilirubin 0.0 - 1.2 mg/dL 0.9     Alkaline Phos 38 - 126 U/L 80     AST 15 - 41 U/L 56     ALT 0 - 44 U/L 25       Assessment/Plan:   Principal Problem:   Pneumococcal bacteremia Active Problems:   Sepsis (HCC)   Fall   Encephalopathy   Community acquired pneumonia   Thrombocytopenia (HCC)   Hematuria   Homelessness   Alcoholism (HCC)   AIDS (HCC)   Positive RPR test   Cholelithiasis without cholecystitis   Aortic atherosclerosis (HCC)   Cerebral atrophy (HCC)   Horseshoe kidney   Malnutrition of moderate degree   HIV dementia (HCC)   HCV (hepatitis C virus)   Pulmonary hypertension (  HCC)   Right ventricular systolic dysfunction   Biological false positive RPR test   Onychomycosis   Agitation   Late latent syphilis   HIV/AIDS Stable, pending dispo planning. - HIV RNA slightly improved  - CD4 count ordered - slightly improved. Still <200, will continue PJP prophylaxis.  - Will continue with current treatment. - Continue Biktarvy - Continue bactrim for PJP prophylaxis.   Housing instability Cognitive impairment c/f HIV Dementia - Quant-gold collected - pending - Appreciate TOC's guidance and support - Continue Zyprexa 5 mg in the Am and PM, and 10 mg at bedtime - Was interviewed for ALF today, but did not qualify due elopement concerns.   HCV Cirrhosis Thrombocytopenia No ascites or recent fevers. Stable - Will need outpatient follow up   Chronic hyponatremia Stable - Weekly labs   Severely reduced right ventricular systolic fxn Pulmonary HTN No evidence of peripheral congestion. Stable.  HTN: amlodipine 5 mg  HLD: rosuvastatin 5 mg   Diet: Normal IVF: None,None VTE: DOAC Code: Full PT/OT recs:  ALF  Dispo: Anticipated discharge to  ALF / Memory unit . Medically stable for discharge.    [name] MD Internal Medicine Resident PGY-1 Pager: [phone] Please contact the on call pager after 5 pm and on weekends at [phone].

## 2023-03-23 DIAGNOSIS — B953 Streptococcus pneumoniae as the cause of diseases classified elsewhere: Secondary | ICD-10-CM | POA: Diagnosis not present

## 2023-03-23 DIAGNOSIS — R7881 Bacteremia: Secondary | ICD-10-CM | POA: Diagnosis not present

## 2023-03-23 NOTE — Progress Notes (Signed)
 HD#48 Subjective:   Summary: Russell [name] is a 58 y.o. male with significant medical history of chronic HIV dementia, prior substance use disorder complicated by housing instability, initially admitted for acute encephalopathy due to sepsis, now resolved. 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 He is stable and currently awaiting placement to ALF given needs for 24 hour supervision pending Medicaid status change.   Overnight Events: None  Doing well today. No acute concerns. May be able to go home with daughter. We will work on trying to get this set up for patient to have best chance of success when he goes.   Objective:  Vital signs in last 24 hours: Vitals:   03/22/23 1018 03/22/23 1543 03/23/23 0438 03/23/23 0817  BP: 127/73 133/78 138/87 117/77  Pulse: 92 89 91 98  Resp: 17 18 18 18   Temp:  (!) 97.4 F (36.3 C) 98.4 F (36.9 C) 97.8 F (36.6 C)  TempSrc:  Oral Oral Oral  SpO2: 100% 99% 99% 98%  Weight:      Height:       Supplemental O2: Room Air SpO2: 98 %   Physical Exam:  Constitutional: Well appearing Pulmonary/Chest: normal work of breathing on room air  Neuro: Moving all limbs spontaneously.  MSK: Bandage applied to the LUE. Denies pain Psych: Appropriate affect.   Filed Weights   02/03/23 1800 03/16/23 0514  Weight: 57.8 kg 63.7 kg     Pertinent Labs:    Latest Ref Rng & Units 03/19/2023   11:03 AM 03/16/2023    6:47 AM 03/09/2023    6:26 AM  CBC  WBC 4.0 - 10.5 K/uL 3.8  2.9  3.6   Hemoglobin 13.0 - 17.0 g/dL 13.0  86.5  78.4   Hematocrit 39.0 - 52.0 % 31.5  32.0  32.0   Platelets 150 - 400 K/uL 112  108  95        Latest Ref Rng & Units 03/19/2023   11:03 AM 03/16/2023    6:47 AM 03/09/2023    6:26 AM  CMP  Glucose 70 - 99 mg/dL 696  80  82   BUN 6 - 20 mg/dL 11  10  12    Creatinine 0.61 - 1.24 mg/dL 2.95  2.84  1.32   Sodium 135 - 145 mmol/L 135  134  132   Potassium 3.5  - 5.1 mmol/L 3.9  3.7  3.8   Chloride 98 - 111 mmol/L 109  110  107   CO2 22 - 32 mmol/L 17  18  20    Calcium 8.9 - 10.3 mg/dL 9.0  8.2  8.3   Total Protein 6.5 - 8.1 g/dL 8.2     Total Bilirubin 0.0 - 1.2 mg/dL 0.9     Alkaline Phos 38 - 126 U/L 80     AST 15 - 41 U/L 56     ALT 0 - 44 U/L 25       Assessment/Plan:   Principal Problem:   Pneumococcal bacteremia Active Problems:   Sepsis (HCC)   Fall   Encephalopathy   Community acquired pneumonia   Thrombocytopenia (HCC)   Hematuria   Homelessness   Alcoholism (HCC)   AIDS (HCC)   Positive RPR test   Cholelithiasis without cholecystitis   Aortic atherosclerosis (HCC)   Cerebral atrophy (HCC)   Horseshoe kidney   Malnutrition of moderate degree   HIV dementia (HCC)  HCV (hepatitis C virus)   Pulmonary hypertension (HCC)   Right ventricular systolic dysfunction   Biological false positive RPR test   Onychomycosis   Agitation   Late latent syphilis   HIV/AIDS Stable, pending dispo planning. - HIV RNA slightly improved  - CD4 count ordered - slightly improved. Still <200, will continue PJP prophylaxis.  - Will continue with current treatment. - Continue Biktarvy - Continue bactrim for PJP prophylaxis.   Housing instability Cognitive impairment c/f HIV Dementia - Quant-gold collected - negative - Appreciate TOC's guidance and support - Continue Zyprexa 5 mg in the Am and PM, and 10 mg at bedtime - Was interviewed for ALF today, but did not qualify due elopement concerns.   HCV Cirrhosis Thrombocytopenia No ascites or recent fevers. Stable - Will need outpatient follow up   Chronic hyponatremia Stable - Weekly labs   Severely reduced right ventricular systolic fxn Pulmonary HTN No evidence of peripheral congestion. Stable.  HTN: amlodipine 5 mg  HLD: rosuvastatin 5 mg   Diet: Normal IVF: None,None VTE: DOAC Code: Full PT/OT recs:  ALF  Dispo: Anticipated discharge to  ALF / Memory unit .  Medically stable for discharge.   [name] MD Internal Medicine Resident PGY-1 Pager: [phone] Please contact the on call pager after 5 pm and on weekends at [phone].

## 2023-03-23 NOTE — TOC Progression Note (Addendum)
 Transition of Care [facility]) - Progression Note    Patient Details  Name: Russell [name] MRN: [id number] Date of Birth: 1965/08/16  Transition of Care [facility]) CM/SW Contact  Lewie Deman A [place], LCSW Phone Number: 03/23/2023, 10:54 AM  Clinical Narrative:     Update 03/27/23 CSW received contact from [place], [phone], she stated that she would reach out to CSW Monday regarding estimated DC date for pt and discussing pt's care needs at said date.   Update 03/24/23  1615 CSW was contacted by Annice Pih, meeting rescheduled for Thursday at 9am.    Update 1455: CSW received phone contact from [facility], she stated that she would be on pt's unit to go over discharge plan for pt. She is to arrive around 11am tomorrow, instructions for [facility] office location provided.    Update 1204- CSW submitted referral to [facility] team, CSW reached out and spoke with Abby, team lead,  at [phone] at organization. She stated that she received referral, and that they would follow up with any questions. They stated that they had a wait list for about 1 month but would start the process with completing referral intake.   CSW was contacted by pt's daughter, Roque Cash, [phone]. She stated that she is pt's only daughter and has been interested in working with pt and taking care of him. CSW informed  her that per last conversation with pt's sister, Jill Side, CSW was informed that pt had no other family and sister was the only contact listed on pt's chart, and was not informed of her being involved with pt. She stated that she and pt's sister have strained relationship from the past.   While on the phone with daughter, CSW also spoke with pt's ex-partner, Cheral Almas, Devana's mother, [phone], she said that she has a nursing and medical assistance and stated she has worked with pt's with Mr. Ringenberg medical history. She stated that she would be willing to take pt temporarily while long term  placement was being found for him. She stated that pt would have 24/7 supervision between herself and daughter when she is not at home. They requested CSW and pt's treatment team primarily reach out to them as the primary contact.  Annice Pih stated that she would reach back out to CSW once her place is set for pt to return home there, estimated by end of the week, but would reach out to CSW periodically to discuss care plan for home. Provider notified.   TOC will continue to follow.        Expected Discharge Plan and Services                                               Social Determinants of Health (SDOH) Interventions SDOH Screenings   Food Insecurity: No Food Insecurity (03/17/2023)  Housing: High Risk (03/17/2023)  Transportation Needs: No Transportation Needs (03/17/2023)  Utilities: At Risk (03/17/2023)  Tobacco Use: High Risk (02/03/2023)    Readmission Risk Interventions    03/17/2023    3:01 PM  Readmission Risk Prevention Plan  Transportation Screening Complete  PCP or Specialist Appt within 3-5 Days Complete  HRI or Home Care Consult Complete  Social Work Consult for Recovery Care Planning/Counseling Complete  Palliative Care Screening Complete  Medication Review [employer]) Complete

## 2023-03-24 DIAGNOSIS — R7881 Bacteremia: Secondary | ICD-10-CM | POA: Diagnosis not present

## 2023-03-24 DIAGNOSIS — B953 Streptococcus pneumoniae as the cause of diseases classified elsewhere: Secondary | ICD-10-CM | POA: Diagnosis not present

## 2023-03-24 MED ORDER — POLYMYXIN B-TRIMETHOPRIM 10000-0.1 UNIT/ML-% OP SOLN
1.0000 [drp] | OPHTHALMIC | Status: DC
  Administered 2023-03-24 – 2023-03-28 (×23): 1 [drp] via OPHTHALMIC
  Filled 2023-03-24: qty 10

## 2023-03-24 NOTE — Progress Notes (Signed)
 HD#49 Subjective:   Summary: Russell [name] is a 58 y.o. male with significant medical history of chronic HIV dementia, prior substance use disorder complicated by housing instability, initially admitted for acute encephalopathy due to sepsis, now resolved. 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 He is stable and currently awaiting placement to ALF given needs for 24 hour supervision pending Medicaid status change.   Overnight Events: None  Doing well today. Patient states he was playing basketball at the park yesterday and was elbowed in the eye. He has some conjunctivitis of the L - eye today. Will treat as pressume bacterial conjunctivitis. . May be able to go home with daughter. We will work on trying to get this set up for patient to have best chance of success when he goes.   Objective:  Vital signs in last 24 hours: Vitals:   03/23/23 0438 03/23/23 0817 03/24/23 0439 03/24/23 0821  BP: 138/87 117/77 130/76 (!) 125/93  Pulse: 91 98 92 89  Resp: 18 18 18 20   Temp: 98.4 F (36.9 C) 97.8 F (36.6 C) 98.1 F (36.7 C) 98 F (36.7 C)  TempSrc: Oral Oral Oral Oral  SpO2: 99% 98% 97% 100%  Weight:      Height:       Supplemental O2: Room Air SpO2: 100 %   Physical Exam:  Constitutional: Well appearing Eyes: L eye conjunctivitis. Some swelling of the L-eyelid. Minimal discharge Pulmonary/Chest: normal work of breathing on room air  Neuro: Moving all limbs spontaneously.  MSK: Bandage applied to the LUE. Denies pain Psych: Appropriate affect.   Filed Weights   02/03/23 1800 03/16/23 0514  Weight: 57.8 kg 63.7 kg     Pertinent Labs:    Latest Ref Rng & Units 03/19/2023   11:03 AM 03/16/2023    6:47 AM 03/09/2023    6:26 AM  CBC  WBC 4.0 - 10.5 K/uL 3.8  2.9  3.6   Hemoglobin 13.0 - 17.0 g/dL 09.8  11.9  14.7   Hematocrit 39.0 - 52.0 % 31.5  32.0  32.0   Platelets 150 - 400 K/uL 112  108  95         Latest Ref Rng & Units 03/19/2023   11:03 AM 03/16/2023    6:47 AM 03/09/2023    6:26 AM  CMP  Glucose 70 - 99 mg/dL 829  80  82   BUN 6 - 20 mg/dL 11  10  12    Creatinine 0.61 - 1.24 mg/dL 5.62  1.30  8.65   Sodium 135 - 145 mmol/L 135  134  132   Potassium 3.5 - 5.1 mmol/L 3.9  3.7  3.8   Chloride 98 - 111 mmol/L 109  110  107   CO2 22 - 32 mmol/L 17  18  20    Calcium 8.9 - 10.3 mg/dL 9.0  8.2  8.3   Total Protein 6.5 - 8.1 g/dL 8.2     Total Bilirubin 0.0 - 1.2 mg/dL 0.9     Alkaline Phos 38 - 126 U/L 80     AST 15 - 41 U/L 56     ALT 0 - 44 U/L 25       Assessment/Plan:   Principal Problem:   Pneumococcal bacteremia Active Problems:   Sepsis (HCC)   Fall   Encephalopathy   Community acquired pneumonia   Thrombocytopenia (HCC)   Hematuria   Homelessness  Alcoholism (HCC)   AIDS (HCC)   Positive RPR test   Cholelithiasis without cholecystitis   Aortic atherosclerosis (HCC)   Cerebral atrophy (HCC)   Horseshoe kidney   Malnutrition of moderate degree   HIV dementia (HCC)   HCV (hepatitis C virus)   Pulmonary hypertension (HCC)   Right ventricular systolic dysfunction   Biological false positive RPR test   Onychomycosis   Agitation   Late latent syphilis   HIV/AIDS Stable, pending dispo planning. - HIV RNA slightly improved  - CD4 count ordered - slightly improved. Still <200, will continue PJP prophylaxis.  - Will continue with current treatment. - Continue Biktarvy - Continue bactrim for PJP prophylaxis.   Housing instability Cognitive impairment c/f HIV Dementia - Quant-gold collected - negative - Appreciate TOC's guidance and support - Continue Zyprexa 5 mg in the Am and PM, and 10 mg at bedtime - Was interviewed for ALF today, but did not qualify due elopement concerns.   Presumed bacterial conjunctivitis Will begin trimethoprim-polymixin b eye drops QID x 5 days  HCV Cirrhosis Thrombocytopenia No ascites or recent fevers. Stable - Will need  outpatient follow up   Chronic hyponatremia Stable - Weekly labs   Severely reduced right ventricular systolic fxn Pulmonary HTN No evidence of peripheral congestion. Stable.  HTN: amlodipine 5 mg  HLD: rosuvastatin 5 mg   Diet: Normal IVF: None,None VTE: DOAC Code: Full PT/OT recs:  ALF  Dispo: Anticipated discharge to  ALF / Memory unit . Medically stable for discharge.   [name] MD Internal Medicine Resident PGY-1 Pager: [phone] Please contact the on call pager after 5 pm and on weekends at [phone].

## 2023-03-24 NOTE — Plan of Care (Signed)

## 2023-03-25 DIAGNOSIS — R7881 Bacteremia: Secondary | ICD-10-CM | POA: Diagnosis not present

## 2023-03-25 DIAGNOSIS — B953 Streptococcus pneumoniae as the cause of diseases classified elsewhere: Secondary | ICD-10-CM | POA: Diagnosis not present

## 2023-03-25 NOTE — Progress Notes (Signed)
 HD#50 Subjective:   Summary: Russell [name] is a 58 y.o. male with significant medical history of chronic HIV dementia, prior substance use disorder complicated by housing instability, initially admitted for acute encephalopathy due to sepsis, now resolved. 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 He is stable and currently awaiting placement to ALF given needs for 24 hour supervision pending Medicaid status change.   Overnight Events: None  Eye feeling a better since starting eye drops. Conjunctivitis slightly improved. Sleeping in recliner during interview. Feeling well, no acute concerns. May be able to go home with daughter. We will work on trying to get this set up for patient to have best chance of success when he goes.   Objective:  Vital signs in last 24 hours: Vitals:   03/24/23 1644 03/24/23 1948 03/24/23 2133 03/25/23 0440  BP: 125/74 126/76  130/84  Pulse: 88 91  93  Resp: 18 17  18   Temp: (!) 97.2 F (36.2 C)  98.2 F (36.8 C) 98 F (36.7 C)  TempSrc:    Oral  SpO2: 100% 99%  100%  Weight:      Height:       Supplemental O2: Room Air SpO2: 100 %   Physical Exam:  Constitutional: Well appearing Eyes: L eye conjunctivitis. Some swelling of the L-eyelid. Pulmonary/Chest: normal work of breathing on room air  Neuro: Moving all limbs spontaneously.  MSK: Bandage applied to the LUE. Denies pain Psych: Appropriate affect.   Filed Weights   02/03/23 1800 03/16/23 0514  Weight: 57.8 kg 63.7 kg     Pertinent Labs:    Latest Ref Rng & Units 03/19/2023   11:03 AM 03/16/2023    6:47 AM 03/09/2023    6:26 AM  CBC  WBC 4.0 - 10.5 K/uL 3.8  2.9  3.6   Hemoglobin 13.0 - 17.0 g/dL 65.7  84.6  96.2   Hematocrit 39.0 - 52.0 % 31.5  32.0  32.0   Platelets 150 - 400 K/uL 112  108  95        Latest Ref Rng & Units 03/19/2023   11:03 AM 03/16/2023    6:47 AM 03/09/2023    6:26 AM  CMP  Glucose 70 - 99  mg/dL 952  80  82   BUN 6 - 20 mg/dL 11  10  12    Creatinine 0.61 - 1.24 mg/dL 8.41  3.24  4.01   Sodium 135 - 145 mmol/L 135  134  132   Potassium 3.5 - 5.1 mmol/L 3.9  3.7  3.8   Chloride 98 - 111 mmol/L 109  110  107   CO2 22 - 32 mmol/L 17  18  20    Calcium 8.9 - 10.3 mg/dL 9.0  8.2  8.3   Total Protein 6.5 - 8.1 g/dL 8.2     Total Bilirubin 0.0 - 1.2 mg/dL 0.9     Alkaline Phos 38 - 126 U/L 80     AST 15 - 41 U/L 56     ALT 0 - 44 U/L 25       Assessment/Plan:   Principal Problem:   Pneumococcal bacteremia Active Problems:   Sepsis (HCC)   Fall   Encephalopathy   Community acquired pneumonia   Thrombocytopenia (HCC)   Hematuria   Homelessness   Alcoholism (HCC)   AIDS (HCC)   Positive RPR test   Cholelithiasis without cholecystitis  Aortic atherosclerosis (HCC)   Cerebral atrophy (HCC)   Horseshoe kidney   Malnutrition of moderate degree   HIV dementia (HCC)   HCV (hepatitis C virus)   Pulmonary hypertension (HCC)   Right ventricular systolic dysfunction   Biological false positive RPR test   Onychomycosis   Agitation   Late latent syphilis   HIV/AIDS Stable, pending dispo planning. - HIV RNA slightly improved  - CD4 count ordered - slightly improved. Still <200, will continue PJP prophylaxis.  - Will continue with current treatment. - Continue Biktarvy - Continue bactrim for PJP prophylaxis.   Housing instability Cognitive impairment c/f HIV Dementia - Quant-gold collected - negative - Appreciate TOC's guidance and support - Continue Zyprexa 5 mg in the Am and PM, and 10 mg at bedtime - Was interviewed for ALF today, but did not qualify due elopement concerns.   Presumed bacterial conjunctivitis Continue begin trimethoprim-polymixin b eye drops QID x 5 days - Last day 02/30/2025  HCV Cirrhosis Thrombocytopenia No ascites or recent fevers. Stable - Will need outpatient follow up   Chronic hyponatremia Stable - Weekly labs   Severely  reduced right ventricular systolic fxn Pulmonary HTN No evidence of peripheral congestion. Stable.  HTN: amlodipine 5 mg  HLD: rosuvastatin 5 mg   Diet: Normal IVF: None,None VTE: DOAC Code: Full PT/OT recs:  ALF  Dispo: Anticipated discharge to  ALF / Memory unit . Medically stable for discharge.   [name] MD Internal Medicine Resident PGY-1 Pager: [phone] Please contact the on call pager after 5 pm and on weekends at [phone].

## 2023-03-25 NOTE — Plan of Care (Signed)

## 2023-03-26 DIAGNOSIS — B953 Streptococcus pneumoniae as the cause of diseases classified elsewhere: Secondary | ICD-10-CM | POA: Diagnosis not present

## 2023-03-26 DIAGNOSIS — R7881 Bacteremia: Secondary | ICD-10-CM | POA: Diagnosis not present

## 2023-03-26 NOTE — Plan of Care (Signed)

## 2023-03-26 NOTE — Progress Notes (Signed)
 HD#51 Subjective:   Summary: Russell [name] is a 58 y.o. male with significant medical history of chronic HIV dementia, prior substance use disorder complicated by housing instability, initially admitted for acute encephalopathy due to sepsis, now resolved. 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 He is stable and currently awaiting placement to ALF given needs for 24 hour supervision pending Medicaid status change.   Overnight Events: None  Eye feeling a better since starting eye drops. Conjunctivitis improved. No complaints - wanted to borrow some money to buy a hat today. Spoke with social work today as well. Originally, daughter / mother of daughter were expected to come visit today, sadly they were unable to come. They plan to come on Monday and state they are making preparations to take this gentleman home.   Objective:  Vital signs in last 24 hours: Vitals:   03/24/23 2133 03/25/23 0440 03/25/23 2025 03/26/23 0443  BP:  130/84 (!) 154/91 (!) 140/85  Pulse:  93 97 93  Resp:  18 18 18   Temp: 98.2 F (36.8 C) 98 F (36.7 C) (!) 97.5 F (36.4 C) 98.3 F (36.8 C)  TempSrc:  Oral Oral Oral  SpO2:  100% 100% 98%  Weight:      Height:       Supplemental O2: Room Air SpO2: 98 %   Physical Exam:  Constitutional: Well appearing Eyes: L eye conjunctivitis. Some swelling of the L-eyelid. Pulmonary/Chest: normal work of breathing on room air  Neuro: Moving all limbs spontaneously.  MSK: Bandage applied to the LUE. Denies pain Psych: Appropriate affect.   Filed Weights   02/03/23 1800 03/16/23 0514  Weight: 57.8 kg 63.7 kg     Pertinent Labs:    Latest Ref Rng & Units 03/19/2023   11:03 AM 03/16/2023    6:47 AM 03/09/2023    6:26 AM  CBC  WBC 4.0 - 10.5 K/uL 3.8  2.9  3.6   Hemoglobin 13.0 - 17.0 g/dL 16.1  09.6  04.5   Hematocrit 39.0 - 52.0 % 31.5  32.0  32.0   Platelets 150 - 400 K/uL 112  108  95         Latest Ref Rng & Units 03/19/2023   11:03 AM 03/16/2023    6:47 AM 03/09/2023    6:26 AM  CMP  Glucose 70 - 99 mg/dL 409  80  82   BUN 6 - 20 mg/dL 11  10  12    Creatinine 0.61 - 1.24 mg/dL 8.11  9.14  7.82   Sodium 135 - 145 mmol/L 135  134  132   Potassium 3.5 - 5.1 mmol/L 3.9  3.7  3.8   Chloride 98 - 111 mmol/L 109  110  107   CO2 22 - 32 mmol/L 17  18  20    Calcium 8.9 - 10.3 mg/dL 9.0  8.2  8.3   Total Protein 6.5 - 8.1 g/dL 8.2     Total Bilirubin 0.0 - 1.2 mg/dL 0.9     Alkaline Phos 38 - 126 U/L 80     AST 15 - 41 U/L 56     ALT 0 - 44 U/L 25       Assessment/Plan:   Principal Problem:   Pneumococcal bacteremia Active Problems:   Sepsis (HCC)   Fall   Encephalopathy   Community acquired pneumonia   Thrombocytopenia (HCC)   Hematuria   Homelessness  Alcoholism (HCC)   AIDS (HCC)   Positive RPR test   Cholelithiasis without cholecystitis   Aortic atherosclerosis (HCC)   Cerebral atrophy (HCC)   Horseshoe kidney   Malnutrition of moderate degree   HIV dementia (HCC)   HCV (hepatitis C virus)   Pulmonary hypertension (HCC)   Right ventricular systolic dysfunction   Biological false positive RPR test   Onychomycosis   Agitation   Late latent syphilis   HIV/AIDS Stable, pending dispo planning. - HIV RNA slightly improved  - CD4 count ordered - slightly improved. Still <200, will continue PJP prophylaxis.  - Will continue with current treatment. - Continue Biktarvy - Continue bactrim for PJP prophylaxis.   Housing instability Cognitive impairment c/f HIV Dementia - Quant-gold collected - negative - Appreciate TOC's guidance and support - Continue Zyprexa 5 mg in the Am and PM, and 10 mg at bedtime - Was interviewed for ALF today, but did not qualify due elopement concerns.   Presumed bacterial conjunctivitis Continue begin trimethoprim-polymixin b eye drops QID x 5 days - Last day 02/30/2025  HCV Cirrhosis Thrombocytopenia No ascites or  recent fevers. Stable - Will need outpatient follow up   Chronic hyponatremia Stable - Weekly labs   Severely reduced right ventricular systolic fxn Pulmonary HTN No evidence of peripheral congestion. Stable.  HTN: amlodipine 5 mg  HLD: rosuvastatin 5 mg   Diet: Normal IVF: None,None VTE: DOAC Code: Full PT/OT recs:  ALF  Dispo: Anticipated discharge to  ALF / Memory unit . Medically stable for discharge.   [name] MD Internal Medicine Resident PGY-1 Pager: [phone] Please contact the on call pager after 5 pm and on weekends at [phone].

## 2023-03-26 NOTE — Plan of Care (Signed)
   Problem: Health Behavior/Discharge Planning: Goal: Ability to manage health-related needs will improve Outcome: Progressing   Problem: Nutrition: Goal: Adequate nutrition will be maintained Outcome: Progressing   Problem: Safety: Goal: Ability to remain free from injury will improve Outcome: Progressing

## 2023-03-27 DIAGNOSIS — B953 Streptococcus pneumoniae as the cause of diseases classified elsewhere: Secondary | ICD-10-CM | POA: Diagnosis not present

## 2023-03-27 DIAGNOSIS — R7881 Bacteremia: Secondary | ICD-10-CM | POA: Diagnosis not present

## 2023-03-27 NOTE — Plan of Care (Signed)
  Problem: Health Behavior/Discharge Planning: Goal: Ability to manage health-related needs will improve Outcome: Progressing   Problem: Nutrition: Goal: Adequate nutrition will be maintained Outcome: Progressing   Problem: Coping: Goal: Level of anxiety will decrease Outcome: Progressing   Problem: Elimination: Goal: Will not experience complications related to bowel motility Outcome: Progressing   Problem: Pain Management: Goal: General experience of comfort will improve Outcome: Progressing

## 2023-03-27 NOTE — Progress Notes (Signed)
 HD#52 Subjective:   Summary: Russell [name] is a 58 y.o. male with significant medical history of chronic HIV dementia, prior substance use disorder complicated by housing instability, initially admitted for acute encephalopathy due to sepsis, now resolved. 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 He is stable and currently awaiting placement to ALF given needs for 24 hour supervision pending Medicaid status change.   Overnight Events: None  Patient feeling well today, he does continue to be a bit dismayed about being in the hospital for this length of time.  It is understandably challenging for him.  Family is scheduled to come by on Monday, at which point we can make some progress in getting him a safe discharge location.  Social work continues to work on their end to try to resolve the situation as well.  Objective:  Vital signs in last 24 hours: Vitals:   03/26/23 1224 03/26/23 1504 03/26/23 2036 03/27/23 0409  BP: 133/79 130/83 132/83 128/79  Pulse: 93 98 85 87  Resp: 18 17 18 18   Temp: 98.5 F (36.9 C) 98.1 F (36.7 C) 97.8 F (36.6 C) 98 F (36.7 C)  TempSrc: Oral Oral Oral Oral  SpO2: 97% 98% 99% 100%  Weight:      Height:       Supplemental O2: Room Air SpO2: 100 %   Physical Exam:  Constitutional: Well appearing Eyes: L eye conjunctivitis. Some swelling of the L-eyelid. Pulmonary/Chest: normal work of breathing on room air  Neuro: Moving all limbs spontaneously.  MSK: Bandage applied to the LUE. Denies pain Psych: Appropriate affect.   Filed Weights   02/03/23 1800 03/16/23 0514  Weight: 57.8 kg 63.7 kg     Pertinent Labs:    Latest Ref Rng & Units 03/19/2023   11:03 AM 03/16/2023    6:47 AM 03/09/2023    6:26 AM  CBC  WBC 4.0 - 10.5 K/uL 3.8  2.9  3.6   Hemoglobin 13.0 - 17.0 g/dL 16.1  09.6  04.5   Hematocrit 39.0 - 52.0 % 31.5  32.0  32.0   Platelets 150 - 400 K/uL 112  108  95         Latest Ref Rng & Units 03/19/2023   11:03 AM 03/16/2023    6:47 AM 03/09/2023    6:26 AM  CMP  Glucose 70 - 99 mg/dL 409  80  82   BUN 6 - 20 mg/dL 11  10  12    Creatinine 0.61 - 1.24 mg/dL 8.11  9.14  7.82   Sodium 135 - 145 mmol/L 135  134  132   Potassium 3.5 - 5.1 mmol/L 3.9  3.7  3.8   Chloride 98 - 111 mmol/L 109  110  107   CO2 22 - 32 mmol/L 17  18  20    Calcium 8.9 - 10.3 mg/dL 9.0  8.2  8.3   Total Protein 6.5 - 8.1 g/dL 8.2     Total Bilirubin 0.0 - 1.2 mg/dL 0.9     Alkaline Phos 38 - 126 U/L 80     AST 15 - 41 U/L 56     ALT 0 - 44 U/L 25       Assessment/Plan:   Principal Problem:   Pneumococcal bacteremia Active Problems:   Sepsis (HCC)   Fall   Encephalopathy   Community acquired pneumonia   Thrombocytopenia (HCC)   Hematuria  Homelessness   Alcoholism (HCC)   AIDS (HCC)   Positive RPR test   Cholelithiasis without cholecystitis   Aortic atherosclerosis (HCC)   Cerebral atrophy (HCC)   Horseshoe kidney   Malnutrition of moderate degree   HIV dementia (HCC)   HCV (hepatitis C virus)   Pulmonary hypertension (HCC)   Right ventricular systolic dysfunction   Biological false positive RPR test   Onychomycosis   Agitation   Late latent syphilis   HIV/AIDS Stable, pending dispo planning. - HIV RNA slightly improved  - CD4 count ordered - slightly improved. Still <200, will continue PJP prophylaxis.  - Will continue with current treatment. - Continue Biktarvy - Continue bactrim for PJP prophylaxis.   Housing instability Cognitive impairment c/f HIV Dementia - Quant-gold collected - negative - Appreciate TOC's guidance and support - Continue Zyprexa 5 mg in the Am and PM, and 10 mg at bedtime - Was interviewed for ALF today, but did not qualify due elopement concerns.   Presumed bacterial conjunctivitis Improved greatly -  Continue trimethoprim-polymixin b eye drops QID x 5 days - Last day  02/30/2025  HCV Cirrhosis Thrombocytopenia No ascites or recent fevers. Stable - Will need outpatient follow up   Chronic hyponatremia Stable - Weekly labs   Severely reduced right ventricular systolic fxn Pulmonary HTN No evidence of peripheral congestion. Stable.  HTN: amlodipine 5 mg  HLD: rosuvastatin 5 mg   Diet: Normal IVF: None,None VTE: DOAC Code: Full PT/OT recs:  ALF  Dispo: Anticipated discharge to  ALF / Memory unit . Medically stable for discharge.   [name] MD Internal Medicine Resident PGY-1 Pager: [phone] Please contact the on call pager after 5 pm and on weekends at [phone].

## 2023-03-27 NOTE — Plan of Care (Signed)

## 2023-03-27 NOTE — Progress Notes (Signed)
 HD#52 SUBJECTIVE:  Patient Summary: Russell [name] is a 58 y.o. male with significant medical history of chronic HIV dementia, prior substance use disorder complicated by housing instability, initially admitted for acute encephalopathy due to sepsis, now resolved. 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 He is stable and currently awaiting placement to ALF versus home with family given needs for 24 hour supervision.  Overnight Events: NAEON  Interim History: Russell [name] is requesting a type of cap with the ties, rather than a skull cap. He is wondering if his sister can come visit him; I assured him that she can.  OBJECTIVE:  Vital Signs: Vitals:   03/26/23 1504 03/26/23 2036 03/27/23 0409 03/27/23 0857  BP: 130/83 132/83 128/79 103/80  Pulse: 98 85 87 92  Resp: 17 18 18 18   Temp: 98.1 F (36.7 C) 97.8 F (36.6 C) 98 F (36.7 C) 98.6 F (37 C)  TempSrc: Oral Oral Oral   SpO2: 98% 99% 100% 98%  Weight:      Height:       Supplemental O2: Room Air SpO2: 98 %  Filed Weights   02/03/23 1800 03/16/23 0514  Weight: 57.8 kg 63.7 kg     Intake/Output Summary (Last 24 hours) at 03/27/2023 1300 Last data filed at 03/26/2023 1330 Gross per 24 hour  Intake 240 ml  Output --  Net 240 ml   Net IO Since Admission: 42,979.41 mL [03/27/23 1300]  Physical Exam: Constitutional:Well appearing gentleman, in no acute distress. Pulm:Normal work of breathing on room air. Neuro:No focal deficit noted. Psych:Pleasant mood and affect.  Patient Lines/Drains/Airways Status     Active Line/Drains/Airways     None           ASSESSMENT/PLAN:  Assessment: Principal Problem:   Pneumococcal bacteremia Active Problems:   Sepsis (HCC)   Fall   Encephalopathy   Community acquired pneumonia   Thrombocytopenia (HCC)   Hematuria   Homelessness   Alcoholism (HCC)   AIDS (HCC)   Positive RPR test   Cholelithiasis without  cholecystitis   Aortic atherosclerosis (HCC)   Cerebral atrophy (HCC)   Horseshoe kidney   Malnutrition of moderate degree   HIV dementia (HCC)   HCV (hepatitis C virus)   Pulmonary hypertension (HCC)   Right ventricular systolic dysfunction   Biological false positive RPR test   Onychomycosis   Agitation   Late latent syphilis   Plan: HIV/AIDS Plan: -Continue PJP prophylaxis with bactrim 800-160 mg daily -Continue Biktarvy 50-200-25 mg daily   Housing instability Cognitive impairment c/f HIV dementia Stable, pending safe dispo plan. Plan: -Continue olanzapine 5 mg BID and 10 mg daily at bedtime   Presumed bacterial conjunctivitis Improved greatly with trimethoprim-polymixin b eye drops. Plan: -Continue trimethoprim-polymixin b eye drops QID x 5 days, day 4/5    HCV Cirrhosis Thrombocytopenia Stable. Plan: -Recommend outpatient follow up    Chronic hyponatremia Stable. Plan: -Trend electrolytes on weekly labs   Severely reduced RV systolic function Pulmonary HTN Stable. Plan: -Continue to monitor   HTN Plan: -Continue home amlodipine 5 mg daily  HLD Plan: -Continue home rosuvastatin 5 mg daily  Best Practice: Diet: Regular diet IVF: None VTE: rivaroxaban (XARELTO) tablet 10 mg Start: 02/15/23 1000 Code: Full AB: Bactrim DISPO: Medically stable for discharge pending safe plan.   Signature: Champ Mungo, D.O.  Internal Medicine Resident, PGY-3 [facility]  Pager: [phone]   Please contact  the on call pager after 5 pm and on weekends at [phone].

## 2023-03-27 NOTE — TOC Progression Note (Signed)
 Transition of Care [facility]) - Progression Note    Patient Details  Name: ROLANDO HESSLING MRN: [id number] Date of Birth: 14-Mar-1965  Transition of Care [facility]) CM/SW Contact  Janae Bridgeman, RN Phone Number: 03/27/2023, 4:37 PM  Clinical Narrative:    CM called and left a detailed update with Fredonia Highland, Melissa with [employer]  [phone].  Rosann Auerbach is aware that [place], MSW has been in contact with patient's family to help coordinate possible discharge to home with family - once family is able to determine home availability with 24 hour superivsion.  Patient is difficult to place since patient does not have availability to disability check that would pay for room and board at a locked facility.  No facilities has offered a bed at this time and patient needs 24 hour supervision since he is a flight risk and has HIV dementia with poor short-term memory and cognitive issues relating to his dementia.        Expected Discharge Plan and Services                                               Social Determinants of Health (SDOH) Interventions SDOH Screenings   Food Insecurity: No Food Insecurity (03/17/2023)  Housing: High Risk (03/17/2023)  Transportation Needs: No Transportation Needs (03/17/2023)  Utilities: At Risk (03/17/2023)  Tobacco Use: High Risk (02/03/2023)    Readmission Risk Interventions    03/17/2023    3:01 PM  Readmission Risk Prevention Plan  Transportation Screening Complete  PCP or Specialist Appt within 3-5 Days Complete  HRI or Home Care Consult Complete  Social Work Consult for Recovery Care Planning/Counseling Complete  Palliative Care Screening Complete  Medication Review Oceanographer) Complete

## 2023-03-28 DIAGNOSIS — B953 Streptococcus pneumoniae as the cause of diseases classified elsewhere: Secondary | ICD-10-CM | POA: Diagnosis not present

## 2023-03-28 DIAGNOSIS — R7881 Bacteremia: Secondary | ICD-10-CM | POA: Diagnosis not present

## 2023-03-28 LAB — CBC
HCT: 33.4 % — ABNORMAL LOW (ref 39.0–52.0)
Hemoglobin: 11.3 g/dL — ABNORMAL LOW (ref 13.0–17.0)
MCH: 34.5 pg — ABNORMAL HIGH (ref 26.0–34.0)
MCHC: 33.8 g/dL (ref 30.0–36.0)
MCV: 101.8 fL — ABNORMAL HIGH (ref 80.0–100.0)
Platelets: 112 10*3/uL — ABNORMAL LOW (ref 150–400)
RBC: 3.28 MIL/uL — ABNORMAL LOW (ref 4.22–5.81)
RDW: 15.1 % (ref 11.5–15.5)
WBC: 4 10*3/uL (ref 4.0–10.5)
nRBC: 0 % (ref 0.0–0.2)

## 2023-03-28 LAB — COMPREHENSIVE METABOLIC PANEL
ALT: 24 U/L (ref 0–44)
AST: 54 U/L — ABNORMAL HIGH (ref 15–41)
Albumin: 2.2 g/dL — ABNORMAL LOW (ref 3.5–5.0)
Alkaline Phosphatase: 79 U/L (ref 38–126)
Anion gap: 9 (ref 5–15)
BUN: 12 mg/dL (ref 6–20)
CO2: 19 mmol/L — ABNORMAL LOW (ref 22–32)
Calcium: 8.8 mg/dL — ABNORMAL LOW (ref 8.9–10.3)
Chloride: 107 mmol/L (ref 98–111)
Creatinine, Ser: 1.06 mg/dL (ref 0.61–1.24)
GFR, Estimated: >60 mL/min (ref >60)
Glucose, Bld: 76 mg/dL (ref 70–99)
Potassium: 3.7 mmol/L (ref 3.5–5.1)
Sodium: 135 mmol/L (ref 135–145)
Total Bilirubin: 1 mg/dL (ref 0.0–1.2)
Total Protein: 8.8 g/dL — ABNORMAL HIGH (ref 6.5–8.1)

## 2023-03-28 NOTE — Plan of Care (Signed)

## 2023-03-29 DIAGNOSIS — R7881 Bacteremia: Secondary | ICD-10-CM | POA: Diagnosis not present

## 2023-03-29 DIAGNOSIS — B953 Streptococcus pneumoniae as the cause of diseases classified elsewhere: Secondary | ICD-10-CM | POA: Diagnosis not present

## 2023-03-29 MED ORDER — KETOCONAZOLE 2 % EX SHAM
MEDICATED_SHAMPOO | CUTANEOUS | Status: DC
  Filled 2023-03-29: qty 120

## 2023-03-29 MED ORDER — NAPHAZOLINE-GLYCERIN 0.012-0.25 % OP SOLN
1.0000 [drp] | Freq: Four times a day (QID) | OPHTHALMIC | Status: DC | PRN
  Filled 2023-03-29 (×2): qty 15

## 2023-03-29 NOTE — Progress Notes (Signed)
 HD#54 Subjective:   Summary: Russell [name] is a 58 y.o. male with significant medical history of chronic HIV dementia, prior substance use disorder complicated by housing instability, initially admitted for acute encephalopathy due to sepsis, now resolved. 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 He is stable and currently awaiting placement / disposition.  Overnight Events: None  No changes, a bit more confused than usual today. No acute complaints. Would like eye drops for his eyes. Some increased dandruff today c/w seb derm. Will initiate nizoral shampoo twice weekly.   Objective:  Vital signs in last 24 hours: Vitals:   03/28/23 0506 03/28/23 0800 03/28/23 2111 03/29/23 0417  BP: (!) 143/82 139/72 127/77 109/63  Pulse: 100 92 87 82  Resp: 18 17 18 17   Temp: 97.7 F (36.5 C) (!) 97.5 F (36.4 C) 99.2 F (37.3 C) 98 F (36.7 C)  TempSrc: Oral Oral Oral   SpO2: 98% 97% 98% 100%  Weight:      Height:       Supplemental O2: Room Air SpO2: 100 %   Physical Exam:  Constitutional: Well appearing Pulmonary/Chest: normal work of breathing on room air  Neuro: Moving all limbs spontaneously.  MSK: Bandage applied to the LUE. Denies pain Psych: Appropriate affect.   Filed Weights   02/03/23 1800 03/16/23 0514  Weight: 57.8 kg 63.7 kg     Pertinent Labs:    Latest Ref Rng & Units 03/28/2023    7:14 AM 03/19/2023   11:03 AM 03/16/2023    6:47 AM  CBC  WBC 4.0 - 10.5 K/uL 4.0  3.8  2.9   Hemoglobin 13.0 - 17.0 g/dL 45.4  09.8  11.9   Hematocrit 39.0 - 52.0 % 33.4  31.5  32.0   Platelets 150 - 400 K/uL 112  112  108        Latest Ref Rng & Units 03/28/2023    7:14 AM 03/19/2023   11:03 AM 03/16/2023    6:47 AM  CMP  Glucose 70 - 99 mg/dL 76  147  80   BUN 6 - 20 mg/dL 12  11  10    Creatinine 0.61 - 1.24 mg/dL 8.29  5.62  1.30   Sodium 135 - 145 mmol/L 135  135  134   Potassium 3.5 - 5.1 mmol/L 3.7   3.9  3.7   Chloride 98 - 111 mmol/L 107  109  110   CO2 22 - 32 mmol/L 19  17  18    Calcium 8.9 - 10.3 mg/dL 8.8  9.0  8.2   Total Protein 6.5 - 8.1 g/dL 8.8  8.2    Total Bilirubin 0.0 - 1.2 mg/dL 1.0  0.9    Alkaline Phos 38 - 126 U/L 79  80    AST 15 - 41 U/L 54  56    ALT 0 - 44 U/L 24  25      Assessment/Plan:   Principal Problem:   Pneumococcal bacteremia Active Problems:   Sepsis (HCC)   Fall   Encephalopathy   Community acquired pneumonia   Thrombocytopenia (HCC)   Hematuria   Homelessness   Alcoholism (HCC)   AIDS (HCC)   Positive RPR test   Cholelithiasis without cholecystitis   Aortic atherosclerosis (HCC)   Cerebral atrophy (HCC)   Horseshoe kidney   Malnutrition of moderate degree   HIV dementia (HCC)   HCV (hepatitis C virus)  Pulmonary hypertension (HCC)   Right ventricular systolic dysfunction   Biological false positive RPR test   Onychomycosis   Agitation   Late latent syphilis   HIV/AIDS - Continue Biktarvy - Continue bactrim for PJP prophylaxis.   Housing instability Cognitive impairment c/f HIV Dementia - Continue Zyprexa 5 mg in the Am and PM, and 10 mg at bedtime  HCV Cirrhosis Thrombocytopenia - Will need outpatient follow up   Chronic hyponatremia - Weekly labs   Severely reduced right ventricular systolic fxn Pulmonary HTN No evidence of peripheral congestion. Stable.  HTN: amlodipine 5 mg  HLD: rosuvastatin 5 mg  Seborrheic Dermatitis: Nizoral 2% twice weekly  Diet: Normal IVF: None,None VTE: DOAC Code: Full PT/OT recs:  ALF  Dispo: Anticipated discharge to  ALF / Memory unit . Medically stable for discharge.   [name] MD Internal Medicine Resident PGY-1 Pager: [phone] Please contact the on call pager after 5 pm and on weekends at [phone].

## 2023-03-29 NOTE — Plan of Care (Signed)

## 2023-03-30 NOTE — Plan of Care (Signed)

## 2023-03-30 NOTE — Plan of Care (Signed)
   Problem: Education: Goal: Knowledge of General Education information will improve Description Including pain rating scale, medication(s)/side effects and non-pharmacologic comfort measures Outcome: Progressing   Problem: Health Behavior/Discharge Planning: Goal: Ability to manage health-related needs will improve Outcome: Progressing

## 2023-03-30 NOTE — Progress Notes (Signed)
 HD#55 Subjective:   Summary: Russell [name] is a 58 y.o. male with significant medical history of chronic HIV dementia, prior substance use disorder complicated by housing instability, initially admitted for acute encephalopathy due to sepsis, now resolved. 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 He is stable and currently awaiting placement / disposition.  Overnight Events: None  Near baseline, waiting to see if family would come visit.   Objective:  Vital signs in last 24 hours: Vitals:   03/29/23 1550 03/29/23 1937 03/30/23 0431 03/30/23 0842  BP: 109/61 131/75 129/85 137/79  Pulse: 83 94 88 100  Resp: 18 19 18    Temp: 98 F (36.7 C) 99.1 F (37.3 C) 97.9 F (36.6 C) 98.9 F (37.2 C)  TempSrc: Oral Oral Oral   SpO2: 100% 98% 98% 97%  Weight:      Height:       Supplemental O2: Room Air SpO2: 97 %   Physical Exam:  Constitutional: Well appearing Pulmonary/Chest: normal work of breathing on room air  Neuro: Moving all limbs spontaneously.  MSK: Bandage applied to the LUE. Denies pain Psych: Appropriate affect.   Filed Weights   02/03/23 1800 03/16/23 0514  Weight: 57.8 kg 63.7 kg     Pertinent Labs:    Latest Ref Rng & Units 03/28/2023    7:14 AM 03/19/2023   11:03 AM 03/16/2023    6:47 AM  CBC  WBC 4.0 - 10.5 K/uL 4.0  3.8  2.9   Hemoglobin 13.0 - 17.0 g/dL 86.5  78.4  69.6   Hematocrit 39.0 - 52.0 % 33.4  31.5  32.0   Platelets 150 - 400 K/uL 112  112  108        Latest Ref Rng & Units 03/28/2023    7:14 AM 03/19/2023   11:03 AM 03/16/2023    6:47 AM  CMP  Glucose 70 - 99 mg/dL 76  295  80   BUN 6 - 20 mg/dL 12  11  10    Creatinine 0.61 - 1.24 mg/dL 2.84  1.32  4.40   Sodium 135 - 145 mmol/L 135  135  134   Potassium 3.5 - 5.1 mmol/L 3.7  3.9  3.7   Chloride 98 - 111 mmol/L 107  109  110   CO2 22 - 32 mmol/L 19  17  18    Calcium 8.9 - 10.3 mg/dL 8.8  9.0  8.2   Total Protein 6.5 -  8.1 g/dL 8.8  8.2    Total Bilirubin 0.0 - 1.2 mg/dL 1.0  0.9    Alkaline Phos 38 - 126 U/L 79  80    AST 15 - 41 U/L 54  56    ALT 0 - 44 U/L 24  25      Assessment/Plan:   Principal Problem:   Pneumococcal bacteremia Active Problems:   Sepsis (HCC)   Fall   Encephalopathy   Community acquired pneumonia   Thrombocytopenia (HCC)   Hematuria   Homelessness   Alcoholism (HCC)   AIDS (HCC)   Positive RPR test   Cholelithiasis without cholecystitis   Aortic atherosclerosis (HCC)   Cerebral atrophy (HCC)   Horseshoe kidney   Malnutrition of moderate degree   HIV dementia (HCC)   HCV (hepatitis C virus)   Pulmonary hypertension (HCC)   Right ventricular systolic dysfunction   Biological false positive RPR test   Onychomycosis   Agitation  Late latent syphilis   HIV/AIDS - Continue Biktarvy - Continue bactrim for PJP prophylaxis.   Housing instability Cognitive impairment c/f HIV Dementia - Continue Zyprexa 5 mg in the Am and PM, and 10 mg at bedtime  HCV Cirrhosis Thrombocytopenia - Will need outpatient follow up   Chronic hyponatremia - Weekly labs   Severely reduced right ventricular systolic fxn Pulmonary HTN No evidence of peripheral congestion. Stable.  HTN: amlodipine 5 mg  HLD: rosuvastatin 5 mg  Seborrheic Dermatitis: Nizoral 2% twice weekly  Diet: Normal IVF: None,None VTE: DOAC Code: Full PT/OT recs:  ALF  Dispo: Anticipated discharge to  ALF / Memory unit . Medically stable for discharge.   [name] MD Internal Medicine Resident PGY-1 Pager: [phone] Please contact the on call pager after 5 pm and on weekends at [phone].

## 2023-03-31 NOTE — Progress Notes (Signed)
 HD#56 Subjective:   Summary: Russell [name] is a 58 y.o. male with significant medical history of chronic HIV dementia, prior substance use disorder complicated by housing instability, initially admitted for acute encephalopathy due to sepsis, now resolved. 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 He is stable and currently awaiting placement / disposition.  Overnight Events: None  No updates today. Waiting on placement / family for dispo. Getting dressed this AM. Doing well. Appreciate his patience.   Objective:  Vital signs in last 24 hours: Vitals:   03/30/23 1656 03/30/23 2027 03/31/23 0454 03/31/23 0756  BP: 136/74 (!) 142/75 137/88 126/72  Pulse: 98 (!) 102 89 91  Resp:  18 18 17   Temp: 98.7 F (37.1 C) 97.8 F (36.6 C) 98.1 F (36.7 C)   TempSrc: Oral Oral Oral   SpO2: 98% 97% 99% 98%  Weight:      Height:       Supplemental O2: Room Air SpO2: 98 %   Physical Exam:  Constitutional: Well appearing Pulmonary/Chest: normal work of breathing on room air  Neuro: Moving all limbs spontaneously.  MSK: Bandage applied to the LUE. Psych: Appropriate affect.   Filed Weights   02/03/23 1800 03/16/23 0514  Weight: 57.8 kg 63.7 kg     Pertinent Labs:    Latest Ref Rng & Units 03/28/2023    7:14 AM 03/19/2023   11:03 AM 03/16/2023    6:47 AM  CBC  WBC 4.0 - 10.5 K/uL 4.0  3.8  2.9   Hemoglobin 13.0 - 17.0 g/dL 16.1  09.6  04.5   Hematocrit 39.0 - 52.0 % 33.4  31.5  32.0   Platelets 150 - 400 K/uL 112  112  108        Latest Ref Rng & Units 03/28/2023    7:14 AM 03/19/2023   11:03 AM 03/16/2023    6:47 AM  CMP  Glucose 70 - 99 mg/dL 76  409  80   BUN 6 - 20 mg/dL 12  11  10    Creatinine 0.61 - 1.24 mg/dL 8.11  9.14  7.82   Sodium 135 - 145 mmol/L 135  135  134   Potassium 3.5 - 5.1 mmol/L 3.7  3.9  3.7   Chloride 98 - 111 mmol/L 107  109  110   CO2 22 - 32 mmol/L 19  17  18    Calcium 8.9 - 10.3  mg/dL 8.8  9.0  8.2   Total Protein 6.5 - 8.1 g/dL 8.8  8.2    Total Bilirubin 0.0 - 1.2 mg/dL 1.0  0.9    Alkaline Phos 38 - 126 U/L 79  80    AST 15 - 41 U/L 54  56    ALT 0 - 44 U/L 24  25      Assessment/Plan:   Principal Problem:   Pneumococcal bacteremia Active Problems:   Sepsis (HCC)   Fall   Encephalopathy   Community acquired pneumonia   Thrombocytopenia (HCC)   Hematuria   Homelessness   Alcoholism (HCC)   AIDS (HCC)   Positive RPR test   Cholelithiasis without cholecystitis   Aortic atherosclerosis (HCC)   Cerebral atrophy (HCC)   Horseshoe kidney   Malnutrition of moderate degree   HIV dementia (HCC)   HCV (hepatitis C virus)   Pulmonary hypertension (HCC)   Right ventricular systolic dysfunction   Biological false positive RPR test  Onychomycosis   Agitation   Late latent syphilis   HIV/AIDS - Continue Biktarvy - Continue bactrim for PJP prophylaxis.   Housing instability Cognitive impairment c/f HIV Dementia - Continue Zyprexa 5 mg in the Am and PM, and 10 mg at bedtime  HCV Cirrhosis Thrombocytopenia - Will need outpatient follow up   Chronic hyponatremia - Weekly labs   Severely reduced right ventricular systolic fxn Pulmonary HTN No evidence of peripheral congestion. Stable.  HTN: amlodipine 5 mg  HLD: rosuvastatin 5 mg  Seborrheic Dermatitis: Nizoral 2% twice weekly  Diet: Normal IVF: None,None VTE: DOAC Code: Full PT/OT recs:  ALF  Dispo: Anticipated discharge to  ALF / Memory unit . Medically stable for discharge.   [name] MD Internal Medicine Resident PGY-1 Pager: [phone] Please contact the on call pager after 5 pm and on weekends at [phone].

## 2023-03-31 NOTE — Plan of Care (Signed)

## 2023-03-31 NOTE — TOC Progression Note (Signed)
 Transition of Care [facility]) - Progression Note    Patient Details  Name: LONI ABDON MRN: [id number] Date of Birth: [birth year]  Transition of [facility]) CM/SW Contact  Shykeria Sakamoto A [place], LCSW Phone Number: 03/31/2023, 4:15 PM  Clinical Narrative:     CSW reached out to pt's former partner, Mare Loan left voicemail with contact information to reach back out to CSW.    TOC will continue to follow.        Expected Discharge Plan and Services                                               Social Determinants of Health (SDOH) Interventions SDOH Screenings   Food Insecurity: No Food Insecurity (03/17/2023)  Housing: High Risk (03/17/2023)  Transportation Needs: No Transportation Needs (03/17/2023)  Utilities: At Risk (03/17/2023)  Tobacco Use: High Risk (02/03/2023)    Readmission Risk Interventions    03/17/2023    3:01 PM  Readmission Risk Prevention Plan  Transportation Screening Complete  PCP or Specialist Appt within 3-5 Days Complete  HRI or Home Care Consult Complete  Social Work Consult for Recovery Care Planning/Counseling Complete  Palliative Care Screening Complete  Medication Review Oceanographer) Complete

## 2023-04-01 NOTE — Progress Notes (Signed)
 HD#57 Subjective:   Summary: Russell [name] is a 58 y.o. male with significant medical history of chronic HIV dementia, prior substance use disorder complicated by housing instability, initially admitted for acute encephalopathy due to sepsis, now resolved. 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 He is stable and currently awaiting placement / disposition.  Overnight Events: None  Family to come visit on Friday, dispo pending on when they are able to take him home. Doing well today. No new concerns.   Objective:  Vital signs in last 24 hours: Vitals:   03/31/23 2051 04/01/23 0526 04/01/23 0800 04/01/23 0817  BP: 127/68 107/60 129/80 129/80  Pulse: 94 84 86   Resp: 18 18 17    Temp: 98.8 F (37.1 C) 98.2 F (36.8 C) 97.8 F (36.6 C)   TempSrc: Oral Oral Oral   SpO2: 99% 97% 98%   Weight:      Height:       Supplemental O2: Room Air SpO2: 98 %   Physical Exam:  Constitutional: Well appearing Pulmonary/Chest: normal work of breathing on room air  Neuro: Moving all limbs spontaneously.  MSK: Bandage applied to the LUE. Psych: Appropriate affect.   Filed Weights   02/03/23 1800 03/16/23 0514  Weight: 57.8 kg 63.7 kg     Pertinent Labs:    Latest Ref Rng & Units 03/28/2023    7:14 AM 03/19/2023   11:03 AM 03/16/2023    6:47 AM  CBC  WBC 4.0 - 10.5 K/uL 4.0  3.8  2.9   Hemoglobin 13.0 - 17.0 g/dL 84.1  32.4  40.1   Hematocrit 39.0 - 52.0 % 33.4  31.5  32.0   Platelets 150 - 400 K/uL 112  112  108        Latest Ref Rng & Units 03/28/2023    7:14 AM 03/19/2023   11:03 AM 03/16/2023    6:47 AM  CMP  Glucose 70 - 99 mg/dL 76  027  80   BUN 6 - 20 mg/dL 12  11  10    Creatinine 0.61 - 1.24 mg/dL 2.53  6.64  4.03   Sodium 135 - 145 mmol/L 135  135  134   Potassium 3.5 - 5.1 mmol/L 3.7  3.9  3.7   Chloride 98 - 111 mmol/L 107  109  110   CO2 22 - 32 mmol/L 19  17  18    Calcium 8.9 - 10.3 mg/dL 8.8  9.0   8.2   Total Protein 6.5 - 8.1 g/dL 8.8  8.2    Total Bilirubin 0.0 - 1.2 mg/dL 1.0  0.9    Alkaline Phos 38 - 126 U/L 79  80    AST 15 - 41 U/L 54  56    ALT 0 - 44 U/L 24  25      Assessment/Plan:   Principal Problem:   Pneumococcal bacteremia Active Problems:   Sepsis (HCC)   Fall   Encephalopathy   Community acquired pneumonia   Thrombocytopenia (HCC)   Hematuria   Homelessness   Alcoholism (HCC)   AIDS (HCC)   Positive RPR test   Cholelithiasis without cholecystitis   Aortic atherosclerosis (HCC)   Cerebral atrophy (HCC)   Horseshoe kidney   Malnutrition of moderate degree   HIV dementia (HCC)   HCV (hepatitis C virus)   Pulmonary hypertension (HCC)   Right ventricular systolic dysfunction   Biological false positive  RPR test   Onychomycosis   Agitation   Late latent syphilis   HIV/AIDS - Continue Biktarvy - Continue bactrim for PJP prophylaxis.   Housing instability Cognitive impairment c/f HIV Dementia - Continue Zyprexa 5 mg in the Am and PM, and 10 mg at bedtime  HCV Cirrhosis Thrombocytopenia - Will need outpatient follow up   Chronic hyponatremia - Weekly labs   Severely reduced right ventricular systolic fxn Pulmonary HTN No evidence of peripheral congestion. Stable.  HTN: amlodipine 5 mg  HLD: rosuvastatin 5 mg  Seborrheic Dermatitis: Nizoral 2% twice weekly  Diet: Normal IVF: None,None VTE: DOAC Code: Full PT/OT recs:  ALF  Dispo: Anticipated discharge to  with family/ALF / Memory unit . Medically stable for discharge.   [name] MD Internal Medicine Resident PGY-1 Pager: [phone] Please contact the on call pager after 5 pm and on weekends at [phone].

## 2023-04-01 NOTE — TOC Progression Note (Signed)
 Transition of Care [facility]) - Progression Note    Patient Details  Name: AYYAN SITES MRN: [id number] Date of Birth: 10/14/1965  [facility]) CM/SW Contact  Tramayne Sebesta A [place], LCSW Phone Number: 04/01/2023, 4:39 PM  Clinical Narrative:     CSW spoke with pt's ex, Annice Pih, she stated that she would meet CSW in person to discuss options for taking pt home.  Provider notified.   CSW also faxed referral for possible consideration for placement to [place] facilities in case they have any openings and pt fits their criteria for placement.    TOC will continue to follow.        Expected Discharge Plan and Services                                               Social Determinants of Health (SDOH) Interventions SDOH Screenings   Food Insecurity: No Food Insecurity (03/17/2023)  Housing: High Risk (03/17/2023)  Transportation Needs: No Transportation Needs (03/17/2023)  Utilities: At Risk (03/17/2023)  Tobacco Use: High Risk (02/03/2023)    Readmission Risk Interventions    03/17/2023    3:01 PM  Readmission Risk Prevention Plan  Transportation Screening Complete  PCP or Specialist Appt within 3-5 Days Complete  HRI or Home Care Consult Complete  Social Work Consult for Recovery Care Planning/Counseling Complete  Palliative Care Screening Complete  Medication Review Oceanographer) Complete

## 2023-04-01 NOTE — Plan of Care (Signed)

## 2023-04-01 NOTE — Plan of Care (Signed)
  Problem: Education: Goal: Knowledge of General Education information will improve Description: Including pain rating scale, medication(s)/side effects and non-pharmacologic comfort measures 04/01/2023 0220 by Cheral Bay, RN Outcome: Progressing 04/01/2023 0219 by Cheral Bay, RN Outcome: Progressing   Problem: Health Behavior/Discharge Planning: Goal: Ability to manage health-related needs will improve 04/01/2023 0220 by Cheral Bay, RN Outcome: Progressing 04/01/2023 0219 by Cheral Bay, RN Outcome: Progressing   Problem: Clinical Measurements: Goal: Ability to maintain clinical measurements within normal limits will improve 04/01/2023 0220 by Cheral Bay, RN Outcome: Progressing 04/01/2023 0219 by Cheral Bay, RN Outcome: Progressing Goal: Will remain free from infection 04/01/2023 0220 by Cheral Bay, RN Outcome: Progressing 04/01/2023 0219 by Cheral Bay, RN Outcome: Progressing Goal: Diagnostic test results will improve 04/01/2023 0220 by Cheral Bay, RN Outcome: Progressing 04/01/2023 0219 by Cheral Bay, RN Outcome: Progressing Goal: Respiratory complications will improve 04/01/2023 0220 by Cheral Bay, RN Outcome: Progressing 04/01/2023 0219 by Cheral Bay, RN Outcome: Progressing Goal: Cardiovascular complication will be avoided 04/01/2023 0220 by Cheral Bay, RN Outcome: Progressing 04/01/2023 0219 by Cheral Bay, RN Outcome: Progressing   Problem: Activity: Goal: Risk for activity intolerance will decrease 04/01/2023 0220 by Cheral Bay, RN Outcome: Progressing 04/01/2023 0219 by Cheral Bay, RN Outcome: Progressing   Problem: Nutrition: Goal: Adequate nutrition will be maintained 04/01/2023 0220 by Cheral Bay, RN Outcome: Progressing 04/01/2023 0219 by Cheral Bay, RN Outcome: Progressing   Problem: Coping: Goal: Level of anxiety will decrease 04/01/2023 0220 by Cheral Bay, RN Outcome: Progressing 04/01/2023 0219 by Cheral Bay, RN Outcome: Progressing   Problem: Elimination: Goal: Will not experience complications related to bowel motility 04/01/2023 0220 by Cheral Bay, RN Outcome: Progressing 04/01/2023 0219 by Cheral Bay, RN Outcome: Progressing Goal: Will not experience complications related to urinary retention 04/01/2023 0220 by Cheral Bay, RN Outcome: Progressing 04/01/2023 0219 by Cheral Bay, RN Outcome: Progressing   Problem: Pain Management: Goal: General experience of comfort will improve 04/01/2023 0220 by Cheral Bay, RN Outcome: Progressing 04/01/2023 0219 by Cheral Bay, RN Outcome: Progressing   Problem: Safety: Goal: Ability to remain free from injury will improve 04/01/2023 0220 by Cheral Bay, RN Outcome: Progressing 04/01/2023 0219 by Cheral Bay, RN Outcome: Progressing   Problem: Skin Integrity: Goal: Risk for impaired skin integrity will decrease 04/01/2023 0220 by Cheral Bay, RN Outcome: Progressing 04/01/2023 0219 by Cheral Bay, RN Outcome: Progressing

## 2023-04-02 NOTE — Plan of Care (Signed)

## 2023-04-02 NOTE — Progress Notes (Signed)
 HD#58 Subjective:   Summary: Russell [name] is a 58 y.o. male with significant medical history of chronic HIV dementia, prior substance use disorder complicated by housing instability, initially admitted for acute encephalopathy due to sepsis, now resolved. 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 He is stable and currently awaiting placement / disposition.  Overnight Events: None  Family to come visit on Friday, dispo pending on when they are able to take him home. I informed patient that I will be at meeting to help with discussion. Doing well today. No new concerns.   Objective:  Vital signs in last 24 hours: Vitals:   04/01/23 2049 04/02/23 0421 04/02/23 0758 04/02/23 0925  BP: 124/78 (!) 144/93 (!) 140/83 (!) 140/83  Pulse: 96 89 90   Resp: 18 18 19    Temp: 99.6 F (37.6 C) 98.1 F (36.7 C) 98.7 F (37.1 C)   TempSrc: Oral Oral    SpO2: 97% 97%    Weight:      Height:       Supplemental O2: Room Air SpO2: 97 %   Physical Exam:  Constitutional: Well appearing Pulmonary/Chest: normal work of breathing on room air  Neuro: Moving all limbs spontaneously.  MSK: Bandage applied to the LUE. Psych: Appropriate affect.   Filed Weights   02/03/23 1800 03/16/23 0514  Weight: 57.8 kg 63.7 kg     Pertinent Labs:    Latest Ref Rng & Units 03/28/2023    7:14 AM 03/19/2023   11:03 AM 03/16/2023    6:47 AM  CBC  WBC 4.0 - 10.5 K/uL 4.0  3.8  2.9   Hemoglobin 13.0 - 17.0 g/dL 95.6  21.3  08.6   Hematocrit 39.0 - 52.0 % 33.4  31.5  32.0   Platelets 150 - 400 K/uL 112  112  108        Latest Ref Rng & Units 03/28/2023    7:14 AM 03/19/2023   11:03 AM 03/16/2023    6:47 AM  CMP  Glucose 70 - 99 mg/dL 76  578  80   BUN 6 - 20 mg/dL 12  11  10    Creatinine 0.61 - 1.24 mg/dL 4.69  6.29  5.28   Sodium 135 - 145 mmol/L 135  135  134   Potassium 3.5 - 5.1 mmol/L 3.7  3.9  3.7   Chloride 98 - 111 mmol/L 107  109   110   CO2 22 - 32 mmol/L 19  17  18    Calcium 8.9 - 10.3 mg/dL 8.8  9.0  8.2   Total Protein 6.5 - 8.1 g/dL 8.8  8.2    Total Bilirubin 0.0 - 1.2 mg/dL 1.0  0.9    Alkaline Phos 38 - 126 U/L 79  80    AST 15 - 41 U/L 54  56    ALT 0 - 44 U/L 24  25      Assessment/Plan:   Principal Problem:   Pneumococcal bacteremia Active Problems:   Sepsis (HCC)   Fall   Encephalopathy   Community acquired pneumonia   Thrombocytopenia (HCC)   Hematuria   Homelessness   Alcoholism (HCC)   AIDS (HCC)   Positive RPR test   Cholelithiasis without cholecystitis   Aortic atherosclerosis (HCC)   Cerebral atrophy (HCC)   Horseshoe kidney   Malnutrition of moderate degree   HIV dementia (HCC)   HCV (hepatitis C virus)  Pulmonary hypertension (HCC)   Right ventricular systolic dysfunction   Biological false positive RPR test   Onychomycosis   Agitation   Late latent syphilis   HIV/AIDS - Continue Biktarvy - Continue bactrim for PJP prophylaxis.   Housing instability Cognitive impairment c/f HIV Dementia - Continue Zyprexa 5 mg in the Am and PM, and 10 mg at bedtime  HCV Cirrhosis Thrombocytopenia - Will need outpatient follow up   Chronic hyponatremia - Weekly labs   Severely reduced right ventricular systolic fxn Pulmonary HTN No evidence of peripheral congestion. Stable.  HTN: amlodipine 5 mg  HLD: rosuvastatin 5 mg  Seborrheic Dermatitis: Nizoral 2% twice weekly  Diet: Normal IVF: None,None VTE: DOAC Code: Full PT/OT recs:  ALF  Dispo: Anticipated discharge to  with family/ALF / Memory unit . Medically stable for discharge.   [name] MD Internal Medicine Resident PGY-1 Pager: [phone] Please contact the on call pager after 5 pm and on weekends at [phone].

## 2023-04-03 NOTE — TOC Progression Note (Addendum)
 Transition of Care [facility]) - Progression Note    Patient Details  Name: Russell [name] MRN: [id number] Date of Birth: [birth year]  Transition of Care [facility]) CM/SW Contact  Evani Shrider A [place], LCSW Phone Number: 04/03/2023, 10:07 AM  Clinical Narrative:      Update 1349 CSW spoke with [phone], social worker at infectious disease clinic. She informed CSW that she could work with [facility] program to assist with following up with pt in the community. Contact: [phone],ext:[phone]. Fax, [phone]. She stated that she would follow up with pt in the community until [facility] services can complete intake and begin services with pt. CSW also provided Child psychotherapist with contact information to pt's sister, daughter and ex-partner, she said she would reach out and start process of assisting pt in the community.   CSW followed up with [facility] and sent clinicals to the community agency.    TOC will continue to follow.   CSW met with pt's ex partner, Annice Pih, in [facility] office with [facility] and Dr. Rosaura Carpenter. She stated that while she wants to support pt, she has some concerns about managing his care and having limited space in the home. She said that she would speak with pt's daughter about ability to take him home.  CSW then spoke with pt's sister Revonda Standard, with Dr. Rosaura Carpenter. CSW received permission from [place], as pt's daughter was concerned about sister being involved in the care. She said that pt could return home with her, as CSW explained he is going better and he is compliant with his medications. She said that she works part time and would be able to assist him this weekend and on her available days. She said she would be available to take pt home and pick him up tomorrow AM. Provider stated he would confirm with medicine team regarding possible DC plan before possible discharge is finalized.   CSW set up ACTT referral with [facility] services and provided community resources to pt's family  members.  CSW to reach out to ID social worker Shaletha, to provide update and inform, so continue to assist pt in the community.   TOC will continue to follow.        Expected Discharge Plan and Services                                               Social Determinants of Health (SDOH) Interventions SDOH Screenings   Food Insecurity: No Food Insecurity (03/17/2023)  Housing: High Risk (03/17/2023)  Transportation Needs: No Transportation Needs (03/17/2023)  Utilities: At Risk (03/17/2023)  Tobacco Use: High Risk (02/03/2023)    Readmission Risk Interventions    03/17/2023    3:01 PM  Readmission Risk Prevention Plan  Transportation Screening Complete  PCP or Specialist Appt within 3-5 Days Complete  HRI or Home Care Consult Complete  Social Work Consult for Recovery Care Planning/Counseling Complete  Palliative Care Screening Complete  Medication Review Oceanographer) Complete

## 2023-04-03 NOTE — Progress Notes (Signed)
 HD#59 Subjective:   Summary: Russell [name] is a 58 y.o. male with significant medical history of chronic HIV dementia, prior substance use disorder complicated by housing instability, initially admitted for acute encephalopathy due to sepsis, now resolved. 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 He is stable and currently awaiting placement / disposition.  Overnight Events: None  I was able to briefly speak with family today. Mother of patient's daughter meeting with social work team today and was able to attend tail end of meeting. I was able to speak with patient's sister as well. Plan is to discharge home with sister tomorrow AM  Patient is feeling well today. No new concerns today. Feeling well.   Objective:  Vital signs in last 24 hours: Vitals:   04/02/23 1601 04/02/23 2047 04/03/23 0452 04/03/23 0750  BP: 134/72 137/78 (!) 142/86 139/86  Pulse: 95 94 90 91  Resp: 19 18 18 18   Temp: 97.7 F (36.5 C) 98.2 F (36.8 C) 98.1 F (36.7 C) 98.7 F (37.1 C)  TempSrc:  Oral Oral   SpO2: 99% 100% 99% 98%  Weight:      Height:       Supplemental O2: Room Air SpO2: 98 %   Physical Exam:  Constitutional: Well appearing Pulmonary/Chest: normal work of breathing on room air  Neuro: Moving all limbs spontaneously.  MSK: Bandage applied to the LUE. Psych: Appropriate affect.   Filed Weights   02/03/23 1800 03/16/23 0514  Weight: 57.8 kg 63.7 kg     Pertinent Labs:    Latest Ref Rng & Units 03/28/2023    7:14 AM 03/19/2023   11:03 AM 03/16/2023    6:47 AM  CBC  WBC 4.0 - 10.5 K/uL 4.0  3.8  2.9   Hemoglobin 13.0 - 17.0 g/dL 40.9  81.1  91.4   Hematocrit 39.0 - 52.0 % 33.4  31.5  32.0   Platelets 150 - 400 K/uL 112  112  108        Latest Ref Rng & Units 03/28/2023    7:14 AM 03/19/2023   11:03 AM 03/16/2023    6:47 AM  CMP  Glucose 70 - 99 mg/dL 76  782  80   BUN 6 - 20 mg/dL 12  11  10    Creatinine  0.61 - 1.24 mg/dL 9.56  2.13  0.86   Sodium 135 - 145 mmol/L 135  135  134   Potassium 3.5 - 5.1 mmol/L 3.7  3.9  3.7   Chloride 98 - 111 mmol/L 107  109  110   CO2 22 - 32 mmol/L 19  17  18    Calcium 8.9 - 10.3 mg/dL 8.8  9.0  8.2   Total Protein 6.5 - 8.1 g/dL 8.8  8.2    Total Bilirubin 0.0 - 1.2 mg/dL 1.0  0.9    Alkaline Phos 38 - 126 U/L 79  80    AST 15 - 41 U/L 54  56    ALT 0 - 44 U/L 24  25      Assessment/Plan:   Principal Problem:   Pneumococcal bacteremia Active Problems:   Sepsis (HCC)   Fall   Encephalopathy   Community acquired pneumonia   Thrombocytopenia (HCC)   Hematuria   Homelessness   Alcoholism (HCC)   AIDS (HCC)   Positive RPR test   Cholelithiasis without cholecystitis   Aortic atherosclerosis (HCC)  Cerebral atrophy (HCC)   Horseshoe kidney   Malnutrition of moderate degree   HIV dementia (HCC)   HCV (hepatitis C virus)   Pulmonary hypertension (HCC)   Right ventricular systolic dysfunction   Biological false positive RPR test   Onychomycosis   Agitation   Late latent syphilis   HIV/AIDS - Continue Biktarvy - Continue bactrim for PJP prophylaxis.   Housing instability Cognitive impairment c/f HIV Dementia - Continue Zyprexa 5 mg in the Am and PM, and 10 mg at bedtime  HCV Cirrhosis Thrombocytopenia - Will need outpatient follow up   Chronic hyponatremia - Weekly labs   Severely reduced right ventricular systolic fxn Pulmonary HTN No evidence of peripheral congestion. Stable.  HTN: amlodipine 5 mg  HLD: rosuvastatin 5 mg  Seborrheic Dermatitis: Nizoral 2% twice weekly  Diet: Normal IVF: None,None VTE: DOAC Code: Full PT/OT recs:  ALF  Dispo: Anticipated discharge to  with family/ALF / Memory unit . Medically stable for discharge.   [name] MD Internal Medicine Resident PGY-1 Pager: [phone] Please contact the on call pager after 5 pm and on weekends at [phone].

## 2023-04-03 NOTE — Plan of Care (Signed)

## 2023-04-04 MED ORDER — ROSUVASTATIN CALCIUM 5 MG PO TABS: 5.0000 mg | ORAL_TABLET | Freq: Every day | ORAL | 1 refills | Status: DC

## 2023-04-04 MED ORDER — OLANZAPINE 10 MG PO TBDP
10.0000 mg | ORAL_TABLET | Freq: Every day | ORAL | 1 refills | Status: DC
Start: 2023-04-04 — End: 2023-06-23

## 2023-04-04 MED ORDER — ADULT MULTIVITAMIN W/MINERALS CH: 1.0000 | ORAL_TABLET | Freq: Every day | ORAL | 1 refills | Status: AC

## 2023-04-04 MED ORDER — SULFAMETHOXAZOLE-TRIMETHOPRIM 800-160 MG PO TABS: 1.0000 | ORAL_TABLET | Freq: Every day | ORAL | 0 refills | Status: DC

## 2023-04-04 MED ORDER — OLANZAPINE 5 MG PO TBDP: 5.0000 mg | ORAL_TABLET | Freq: Two times a day (BID) | ORAL | 1 refills | Status: DC

## 2023-04-04 MED ORDER — FOLIC ACID 1 MG PO TABS: 1.0000 mg | ORAL_TABLET | Freq: Every day | ORAL | 1 refills | Status: DC

## 2023-04-04 MED ORDER — BICTEGRAVIR-EMTRICITAB-TENOFOV 50-200-25 MG PO TABS: 1.0000 | ORAL_TABLET | Freq: Every day | ORAL | 1 refills | Status: DC

## 2023-04-04 MED ORDER — AMLODIPINE BESYLATE 5 MG PO TABS: 5.0000 mg | ORAL_TABLET | Freq: Every day | ORAL | 1 refills | Status: DC

## 2023-04-04 NOTE — Discharge Summary (Addendum)
 Name: Russell [name] MRN: [id number] DOB: August 28, 1965 58 y.o. PCP: Patient, No Pcp Per  Date of Admission: 02/03/2023  9:55 AM Date of Discharge: 04/04/2023 04/04/2023 Attending Physician: Dr.  Sol Blazing  DISCHARGE DIAGNOSIS:  Primary Problem: Pneumococcal bacteremia   Hospital Problems: Principal Problem:   Pneumococcal bacteremia Active Problems:   Sepsis (HCC)   Fall   Encephalopathy   Community acquired pneumonia   Thrombocytopenia (HCC)   Hematuria   Homelessness   Alcoholism (HCC)   AIDS (HCC)   Positive RPR test   Cholelithiasis without cholecystitis   Aortic atherosclerosis (HCC)   Cerebral atrophy (HCC)   Horseshoe kidney   Malnutrition of moderate degree   HIV dementia (HCC)   HCV (hepatitis C virus)   Pulmonary hypertension (HCC)   Right ventricular systolic dysfunction   Biological 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   bictegravir-emtricitabine-tenofovir AF 50-200-25 MG Tabs tablet Commonly known as: BIKTARVY Take 1 tablet by mouth daily.   folic acid 1 MG tablet Commonly known as: FOLVITE Take 1 tablet (1 mg total) by mouth daily.   multivitamin with minerals Tabs tablet Take 1 tablet by mouth daily.   OLANZapine zydis 10 MG disintegrating tablet Commonly known as: ZYPREXA Take 1 tablet (10 mg total) by mouth at bedtime.   OLANZapine zydis 5 MG disintegrating tablet Commonly known as: ZYPREXA Take 1 tablet (5 mg total) by mouth 2 (two) times daily. Take 1 tablet in the morning and 1 tablet in the afternoon.   rosuvastatin 5 MG tablet Commonly known as: CRESTOR Take 1 tablet (5 mg total) by mouth daily.   sulfamethoxazole-trimethoprim 800-160 MG tablet Commonly known as: BACTRIM  DS Take 1 tablet by mouth daily.        DISPOSITION AND FOLLOW-UP:  Russell [name] was discharged from [facility] in Good condition. At the hospital follow up visit please address:  HIV/AIDS HCV Ensure follow up with Infectious Disease and adherence to medication regimen.   Thrombocytopenia Anemia of chronic disease Repeat CBC.  Aortic atherosclerosis  HLD Ensure adherence to medication regimen.  HTN Ensure adherence to medication regimen.  Follow-up Recommendations: Consults: Infectious Disease Labs: CBC and Comprehensive Metabolic Panel Studies: None Medications:  NEW at discharge Amlodipine 10 mg daily Biktarvy 500-200-25 1 tablet daily Folic acid 1 mg daily Multivitamin 1 tablet daily Olanzapine 5 mg in the morning and afternoon, AND Olanzapine 10 mg at bedtime Rosuvastatin 5 mg daily Bactrim 800-160 mg 1 tablet daily  Follow-up Appointments:  Follow-up Information     Project[facility] Follow up.   Why: Pt has an intake appointment with Derrell Lolling, on Tuesday March 18th at 2pm in person. This is the earliest available appointment, please contact their facility if needed to change it. Thank you! Contact information: [address] [place] [address] [phone]         [name], MD Follow up on 04/22/2023.   Specialty: Infectious Diseases Why: At 10:45 AM Contact information: [address] [place] [address] [phone]         Primary Care Provider. Schedule an appointment as soon as possible for  a visit in 1 week(s).                  HOSPITAL COURSE:  Patient Summary: Patient presented to the ED via EMS after sister found him after a fall on the ground with altered mental status. Blood cultures grew streptococcus pneumoniae, RPR reactive with 1:2 titer. T. Pallidum antibody resulted negative. HIV Screen 4th Generation reactive with HIV 1 Ab reactive. CD4 count of 116. Patient transitioned  to ceftriaxone, biktarvy and bactrim. Patient's mentation improved. Despite resolution of his severe sepsis, the patient remained confused, alert to only self and place. This is thought to be likely related to his chronic, untreated HIV. Psychiatry assessed him and determined not to have capacity to make his own medical decisions. Attempted to leave AMA and was agitated on 1/15. Repeat blood cultures, CXR and respiratory panel were negative. IVC order was placed due to encephalopathy and risk to self with reduced safety awareness. He requires assistance with all ADLs and IDLs.  Started on Zyprexa and has remained easily redirectable since. IVC was discontinued.   HIV/AIDS Last CD4 count 03/19/2023 - 167. Will continue Biktarvy and continue bactrim for PJP prophylaxis. Will need to follow with ID. Scheduled to see Dr. Luciana Axe on 04/22/2023 @ 10:45am.    Reduced right ventricular systolic fxn Pulmonary HTN Per cardiology pulmonary hypertension/RV dysfunction-most likely etiology is HIV with possible contribution from history of substance abuse. Workup including PE study, V/Q scan, and autoimmune workup was negative. Per cardiology - not necessary to follow up, not a candidate for cath at this time.   HCV Early Cirrhosis  Thrombocytopenia Patient is positive for hepatitis C with a viral load of 19,300,000 and imaging showing early cirrhosis. Patient will require HCC monitoring and outpatient follow up with ID. Thrombocytopenia was stable during hospitalization.  Cognitive impairment c/f HIV Dementia Significant cognitive impairment and limited orientation. He has been pleasant and redirectable following acute stabilization of his medical commodities and  initation of antipsychotic medications. ALF placement was pursued, but unsuccessful due to insurance / logistical reasons. Recommended to continue Zyprexa 5 mg BID with additional 10 mg at bedtime. Social work as has up patient with ACTT team and resources  to assist patient in community.   Anemia of Chronic Disease Hemoglobin stable around 12.  Aortic atherosclerosis  HLD Finding found on CT A/P on admission. Lipid panel unremarkable other than low HDL of 14. Crestor 5 mg started. Recommend outpatient follow-up.  HTN Well controlled on amlodipine 5 mg.   Cholelithiasis without cholecystitis  Horseshoe kidney Incidental finding found on CT A/P on admission.   DISCHARGE INSTRUCTIONS:   Discharge Instructions     Ambulatory referral to Neuropsychology   Complete by: As directed    Age-advanced cognitive decline in setting of new HIV diagnosis.   Discharge instructions   Complete by: As directed    Russell [name],  It was a pleasure to care for you during your stay at [facility].  You will need to take the following medications when you go home: -Amlodipine 10 mg daily -Biktarvy 1 tablet daily -Folic acid 1 mg daily -Multivitamin 1 tablet daily -Olanzapine 5 mg in the morning and afternoon, AND -Olanzapine 10 mg at bedtime -Rosuvastatin 5 mg daily -Bactrim 1 tablet daily  Please make an appointment for hospital follow up with your primary care doctor in the next 1-2 weeks.  You will also follow up with Dr. Luciana Axe with infectious disease on March 26 at 10:45  AM.  I wish you the best! Dr. August Saucer       SUBJECTIVE:   Russell [name] is eager to go home today. He has no questions or concerns.  OBJECTIVE:  Discharge Vitals:   BP 127/76 (BP Location: Left Arm)   Pulse 92   Temp 98 F (36.7 C) (Oral)   Resp 18   Ht 5\' 6"  (1.676 m)   Wt 63.7 kg   SpO2 99%   BMI 22.67 kg/m   Constitutional:Well-appearing, seated at edge of bed. In no acute distress. Pulm:Normal work of breathing on room air. Neuro:No focal deficit noted. Psych:Pleasant mood and affect.  Pertinent Labs, Studies, and Procedures:     Latest Ref Rng & Units 03/28/2023    7:14 AM 03/19/2023   11:03 AM 03/16/2023    6:47 AM  CBC  WBC 4.0 - 10.5 K/uL 4.0   3.8  2.9   Hemoglobin 13.0 - 17.0 g/dL 30.8  65.7  84.6   Hematocrit 39.0 - 52.0 % 33.4  31.5  32.0   Platelets 150 - 400 K/uL 112  112  108        Latest Ref Rng & Units 03/28/2023    7:14 AM 03/19/2023   11:03 AM 03/16/2023    6:47 AM  CMP  Glucose 70 - 99 mg/dL 76  962  80   BUN 6 - 20 mg/dL 12  11  10    Creatinine 0.61 - 1.24 mg/dL 9.52  8.41  3.24   Sodium 135 - 145 mmol/L 135  135  134   Potassium 3.5 - 5.1 mmol/L 3.7  3.9  3.7   Chloride 98 - 111 mmol/L 107  109  110   CO2 22 - 32 mmol/L 19  17  18    Calcium 8.9 - 10.3 mg/dL 8.8  9.0  8.2   Total Protein 6.5 - 8.1 g/dL 8.8  8.2    Total Bilirubin 0.0 - 1.2 mg/dL 1.0  0.9    Alkaline Phos 38 - 126 U/L 79  80    AST 15 - 41 U/L 54  56    ALT 0 - 44 U/L 24  25      EEG adult Result Date: 02/03/2023 Charlsie Quest, MD     02/03/2023  3:44 PM Patient Name: Russell [name] MRN: [id number] Epilepsy Attending: Charlsie Quest Referring Physician/Provider: Marrianne Mood, MD Date: 02/03/2023 Duration: 23.21 mins Patient history: 58yo M with ams getting eeg to evaluate for seizure Level of alertness: Awake AEDs during EEG study: Ativan Technical aspects: This EEG study was done with scalp electrodes positioned according to the 10-20 International system of electrode placement. Electrical activity was reviewed with band pass filter of 1-70Hz , sensitivity of 7 uV/mm, display speed of 48mm/sec with a 60Hz  notched filter applied as appropriate. EEG data were recorded continuously and digitally stored.  Video monitoring was available and reviewed as appropriate. Description: The posterior dominant rhythm consists of 7.5 Hz activity of moderate voltage (25-35 uV) seen predominantly in posterior head regions, symmetric and reactive to eye opening and eye closing. EEG showed continuous generalized 5 to 7 Hz theta slowing. Hyperventilation and photic stimulation were not performed.   ABNORMALITY - Continuous slow, generalized IMPRESSION: This study is  suggestive of moderate diffuse encephalopathy. No seizures or epileptiform discharges were seen throughout the recording. Charlsie Quest   CT CHEST ABDOMEN PELVIS W CONTRAST Result Date: 02/03/2023 CLINICAL DATA:  58 year old male with sepsis.  Delirium. EXAM:  CT CHEST, ABDOMEN, AND PELVIS WITH CONTRAST TECHNIQUE: Multidetector CT imaging of the chest, abdomen and pelvis was performed following the standard protocol during bolus administration of intravenous contrast. RADIATION DOSE REDUCTION: This exam was performed according to the departmental dose-optimization program which includes automated exposure control, adjustment of the mA and/or kV according to patient size and/or use of iterative reconstruction technique. CONTRAST:  75mL OMNIPAQUE IOHEXOL 350 MG/ML SOLN COMPARISON:  Portable chest 1045 hours today. FINDINGS: CT CHEST FINDINGS Cardiovascular: Calcified coronary artery atherosclerosis (series 505, image 37). Calcified aortic atherosclerosis. Cardiac size at the upper limits of normal. No pericardial effusion. Grossly patent central pulmonary arteries. Mediastinum/Nodes: Negative, no mediastinal mass or lymphadenopathy. Lungs/Pleura: Major airways are patent with mild respiratory motion. Right lung is largely clear. Minimal mosaic attenuation in the right lower lobe probably gas trapping and atelectasis. Confluent opacity in the left lower lobe anterior basal segment and medial basal segment, partially masslike but also indistinct and appears to be inflammatory (series 506, image 118). No left pleural effusion. Elsewhere the left lung is clear. Musculoskeletal: Partially visible lower cervical spine degeneration. No acute or suspicious osseous abnormality identified in the chest. CT ABDOMEN PELVIS FINDINGS Hepatobiliary: Early phase of liver enhancement. No definite liver abnormality. Layering gravel type gallstones in the gallbladder on series 505, image 71. No pericholecystic inflammation. No bile  duct enlargement. Pancreas: Negative. Spleen: Negative. Adrenals/Urinary Tract: Normal adrenal glands. 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 Symmetric renal enhancement, kidneys appear nonobstructed. No delayed renal images. No nephrolithiasis identified. Diminutive urinary bladder. Stomach/Bowel: Nondilated large and small bowel with retained low-density stool in the distal colon. No oral contrast administered. Appendix not identified. No convincing large bowel inflammation. No free air or free fluid identified. Decompressed stomach. Duodenum motion artifact. Vascular/Lymphatic: Extensive Aortoiliac calcified atherosclerosis. Normal caliber abdominal aorta. Major arterial structures in the abdomen and pelvis remain patent. No lymphadenopathy. Portal venous system not evaluated, no delayed images provided. Reproductive: Negative. Other: No convincing pelvis free fluid. Musculoskeletal: Chronic severe lower lumbar disc and endplate degeneration maximal at L4-L5. No acute or suspicious osseous abnormality identified. IMPRESSION: 1. Left lower lobe Pneumonia. No pleural effusion. 2. No other acute or inflammatory process identified in the chest, abdomen, or pelvis. 3. Cholelithiasis. Horseshoe kidney (normal variant). Aortic Atherosclerosis (ICD10-I70.0). Electronically Signed   By: Odessa Fleming M.D.   On: 02/03/2023 11:58   CT Head Wo Contrast Result Date: 02/03/2023 CLINICAL DATA:  Delirium.  Sepsis. EXAM: CT HEAD WITHOUT CONTRAST TECHNIQUE: Contiguous axial images were obtained from the base of the skull through the vertex without intravenous contrast. RADIATION DOSE REDUCTION: This exam was performed according to the departmental dose-optimization program which includes automated exposure control, adjustment of the mA and/or kV according to patient size and/or use of iterative reconstruction technique. COMPARISON:  Head CT 05/25/2018 FINDINGS: Brain: There is no evidence of an acute infarct,  intracranial hemorrhage, mass, midline shift, or extra-axial fluid collection. Cerebral atrophy has progressed and is moderately advanced for age. Cerebral white matter hypodensities are nonspecific but compatible with mild chronic small vessel ischemic disease. Vascular: Calcified atherosclerosis at the skull base. No hyperdense vessel. Skull: No acute fracture or suspicious osseous lesion. Sinuses/Orbits: Mild mucosal thickening in the left sphenoid sinus. Clear mastoid air cells. Left cataract extraction. Other: None. IMPRESSION: 1. No evidence of acute intracranial abnormality. 2. Mild chronic small vessel ischemic disease. 3. Moderately advanced cerebral atrophy. Electronically Signed   By: Sebastian Ache M.D.   On: 02/03/2023 11:55  DG Chest Portable 1 View Result Date: 02/03/2023 CLINICAL DATA:  Hypertension.  Altered mental status. EXAM: PORTABLE CHEST 1 VIEW COMPARISON:  None Available. FINDINGS: Low lung volume. Mild apparent increased interstitial markings are nonspecific. No frank pulmonary edema. Bilateral lung fields are clear. No dense consolidation or lung collapse. Bilateral costophrenic angles are clear. Normal cardio-mediastinal silhouette. No acute osseous abnormalities. The soft tissues are within normal limits. IMPRESSION: *Mild apparent increased interstitial markings, nonspecific. Otherwise no acute cardiopulmonary abnormality. Electronically Signed   By: Jules Schick M.D.   On: 02/03/2023 11:00     Signed: Champ Mungo, D.O.  Internal Medicine Resident, PGY-3 [facility]  Pager: [phone]

## 2023-04-04 NOTE — Plan of Care (Signed)

## 2023-04-04 NOTE — ED Notes (Signed)
 PT was inpatient, IVC. Patient has now been IVC'ed . Rescind paperwork was scanned into chart, and efiled. Envelope number written in white IVC binder. Original copy was put in the red folder

## 2023-04-05 ENCOUNTER — Telehealth: Payer: Self-pay | Admitting: Student

## 2023-04-05 NOTE — Telephone Encounter (Signed)
 Received page from nursing noting that patient's Zyprexa was not covered by insurance and needs PA. Was notified that PA had been faxed but this could not be located.

## 2023-04-06 ENCOUNTER — Telehealth: Payer: Self-pay

## 2023-04-06 NOTE — Telephone Encounter (Addendum)
 Prior Authorization for patient (OLANZapine 10MG  dispersible tablets) came through on cover my meds was submitted awaiting approval or denial.  [id number]  Russell [name] (Key: [id number]) PA Case ID #: [id number] Rx #: [id number] Need Help? Call us at [phone] Outcome Approved today by [facility] 2017 NCPDP Request Reference Number: [id number]. OLANZAPINE TAB 10MG  ODT is approved through 04/05/2024. For further questions, call [employer] at [phone]. Effective Date: 04/06/2023 Authorization Expiration Date: 04/05/2024 Drug OLANZapine 10MG  dispersible tablets ePA cloud logo Form [employer] Electronic Prior Authorization Form (2017 NCPDP) Original Claim Info 75 Safety doc req, presc call [phone] override code 11PA Req, Dr submit ePA at [facility].comOr MD Call [phone], Possible 3 DSSUBMIT 03 Lvl of Service PA# 72 PA TYPE8Drug Requires Prio

## 2023-04-06 NOTE — Telephone Encounter (Signed)
 You're welcome!

## 2023-04-06 NOTE — Telephone Encounter (Addendum)
 Prior Authorization for patient (OLANZapine 5MG  dispersible tablets) came through on cover my meds was submitted awaiting approval or denial.  Albertha Ghee (Key: [id number]) PA Case ID #: [id number] Rx #: [id number] Need Help? Call us at [phone] Outcome Approved today by [facility] 2017 NCPDP Request Reference Number: [id number]. OLANZAPINE TAB 5MG  ODT is approved through 04/05/2024. For further questions, call [employer] at [phone]. Effective Date: 04/06/2023 Authorization Expiration Date: 04/05/2024 Drug OLANZapine 5MG  dispersible tablets ePA cloud logo Form [employer] Electronic Prior Authorization Form [phone] NCPDP) Original Claim Info [phone] Safety doc req, presc call [phone] override code 11PA Req, Dr submit ePA at [facility].comOr MD Call [phone], Possible 3 DSSUBMIT 03 Lvl of Service PA# 72 PA TYPE8Maximum Daily Dose

## 2023-04-22 ENCOUNTER — Encounter: Payer: Self-pay | Admitting: Internal Medicine

## 2023-04-22 ENCOUNTER — Ambulatory Visit (INDEPENDENT_AMBULATORY_CARE_PROVIDER_SITE_OTHER): Payer: Commercial Managed Care - HMO | Admitting: Internal Medicine

## 2023-04-22 ENCOUNTER — Other Ambulatory Visit: Payer: Self-pay

## 2023-04-22 VITALS — BP 131/83 | HR 82 | Ht 66.0 in | Wt 145.0 lb

## 2023-04-22 DIAGNOSIS — A528 Late syphilis, latent: Secondary | ICD-10-CM

## 2023-04-22 DIAGNOSIS — B182 Chronic viral hepatitis C: Secondary | ICD-10-CM

## 2023-04-22 DIAGNOSIS — K7469 Other cirrhosis of liver: Secondary | ICD-10-CM | POA: Diagnosis not present

## 2023-04-22 DIAGNOSIS — F0280 Dementia in other diseases classified elsewhere without behavioral disturbance: Secondary | ICD-10-CM

## 2023-04-22 DIAGNOSIS — B20 Human immunodeficiency virus [HIV] disease: Secondary | ICD-10-CM

## 2023-04-22 DIAGNOSIS — K7460 Unspecified cirrhosis of liver: Secondary | ICD-10-CM | POA: Insufficient documentation

## 2023-04-22 NOTE — Assessment & Plan Note (Signed)
 He has cirrhosis noted on his ultrasound likely secondary to both hepatitis C and alcohol abuse.  He will need ongoing HCC screening.  Will treat his hepatitis C as above.

## 2023-04-22 NOTE — Assessment & Plan Note (Signed)
 He has positive active chronic hepatitis C.  Will check the genotype and prescribe appropriate medication once that is resulted.  This was discussed with the patient and his sister.

## 2023-04-22 NOTE — Assessment & Plan Note (Signed)
 I discussed with him and his sister and his status with HIV and CD4 count of below 200.  I discussed the need to stay on the Biktarvy lifelong.  She has assured he gets it every day and has had no issues with it.  I will check his CD4 count and viral load again today.  Resistance testing was all wild-type and no concerns.  I have personally spent 45 minutes involved in face-to-face and non-face-to-face activities for this patient on the day of the visit. Professional time spent includes the following activities: Preparing to see the patient (review of tests), Obtaining and/or reviewing separately obtained history (admission/discharge record), Performing a medically appropriate examination and/or evaluation , Ordering medications/tests/procedures, referring and communicating with other health care professionals, Documenting clinical information in the EMR, Independently interpreting results (not separately reported), Communicating results to the patient/family/caregiver, Counseling and educating the patient/family/caregiver and Care coordination (not separately reported).

## 2023-04-22 NOTE — Assessment & Plan Note (Signed)
 MRI noted some atrophy and felt to be most consistent with HIV dementia.  Treatment largely just suppression of the viral load.  On Biktarvy as above.

## 2023-04-22 NOTE — Assessment & Plan Note (Signed)
 He has positive RPR but negative confirmatory antibody.  He was treated with 3 doses of penicillin.

## 2023-04-22 NOTE — Progress Notes (Signed)
   Subjective:    Patient ID: Russell [name], male    DOB: [birth year], 58 y.o.   MRN: [id number]  HPI Russell [name] is here to establish care for HIV and for hospital follow-up. He was initially hospitalized in January for pneumonia and found to have streptococcal pneumonia bacteremia.  He was also found to be HIV positive with a low CD4 count of 116.  He was treated for the bacteremia.  He also had a positive RPR though T. pallidum antibody was negative.  He was given treatment for late latent syphilis.  An MRI of his brain did note some age advanced cerebral atrophy consistent with HIV dementia.  He is here with his sister who is his caregiver.  He is taking his medications.  Has not yet established with a primary physician.   Review of Systems  Constitutional:  Negative for fatigue.  Gastrointestinal:  Negative for diarrhea and nausea.  Skin:  Negative for rash.       Objective:   Physical Exam Eyes:     General: No scleral icterus. Pulmonary:     Effort: Pulmonary effort is normal.  Neurological:     Mental Status: He is alert.   SH": + tobacco        Assessment & Plan:

## 2023-04-23 LAB — T-HELPER CELL (CD4) - (RCID CLINIC ONLY)
CD4 % Helper T Cell: 14 % — ABNORMAL LOW (ref 33–65)
CD4 T Cell Abs: 179 /uL — ABNORMAL LOW (ref 400–1790)

## 2023-04-25 LAB — CBC WITH DIFFERENTIAL/PLATELET
Absolute Lymphocytes: 1658 {cells}/uL (ref 850–3900)
Absolute Monocytes: 403 {cells}/uL (ref 200–950)
Basophils Absolute: 30 {cells}/uL (ref 0–200)
Basophils Relative: 0.8 %
Eosinophils Absolute: 252 {cells}/uL (ref 15–500)
Eosinophils Relative: 6.8 %
HCT: 42.1 % (ref 38.5–50.0)
Hemoglobin: 14.2 g/dL (ref 13.2–17.1)
MCH: 32.7 pg (ref 27.0–33.0)
MCHC: 33.7 g/dL (ref 32.0–36.0)
MCV: 97 fL (ref 80.0–100.0)
MPV: 12 fL (ref 7.5–12.5)
Monocytes Relative: 10.9 %
Neutro Abs: 1358 {cells}/uL — ABNORMAL LOW (ref 1500–7800)
Neutrophils Relative %: 36.7 %
Platelets: 115 10*3/uL — ABNORMAL LOW (ref 140–400)
RBC: 4.34 10*6/uL (ref 4.20–5.80)
RDW: 11.9 % (ref 11.0–15.0)
Total Lymphocyte: 44.8 %
WBC: 3.7 10*3/uL — ABNORMAL LOW (ref 3.8–10.8)

## 2023-04-25 LAB — RPR: RPR Ser Ql: NONREACTIVE

## 2023-04-25 LAB — HIV-1 RNA QUANT-NO REFLEX-BLD
HIV 1 RNA Quant: 23 {copies}/mL — ABNORMAL HIGH
HIV-1 RNA Quant, Log: 1.36 {Log_copies}/mL — ABNORMAL HIGH

## 2023-04-25 LAB — HEPATITIS C GENOTYPE

## 2023-04-26 ENCOUNTER — Other Ambulatory Visit: Payer: Self-pay | Admitting: Internal Medicine

## 2023-04-27 ENCOUNTER — Telehealth: Payer: Self-pay

## 2023-04-27 ENCOUNTER — Other Ambulatory Visit (HOSPITAL_COMMUNITY): Payer: Self-pay

## 2023-04-27 ENCOUNTER — Other Ambulatory Visit: Payer: Self-pay | Admitting: Internal Medicine

## 2023-04-27 MED ORDER — SOFOSBUVIR-VELPATASVIR 400-100 MG PO TABS
1.0000 | ORAL_TABLET | Freq: Every day | ORAL | 2 refills | Status: DC
  Filled 2023-05-05: qty 28, 28d supply, fill #0
  Filled 2023-05-26: qty 28, 28d supply, fill #1

## 2023-04-27 NOTE — Telephone Encounter (Signed)
.  hcv

## 2023-04-27 NOTE — Telephone Encounter (Signed)
 Pharmacy requesting a 90 day supply  Medication sent to pharmacy.

## 2023-04-28 ENCOUNTER — Telehealth: Payer: Self-pay

## 2023-04-28 ENCOUNTER — Other Ambulatory Visit (HOSPITAL_COMMUNITY): Payer: Self-pay

## 2023-04-28 NOTE — Telephone Encounter (Signed)
 Pharmacy Patient Advocate Encounter  Insurance verification completed.   The patient is insured through [employer] claim for [employer]. Currently a quantity of 28 is a 28 day supply and Patient will need to pay his premium before I can complete a Prior Auth.   This test claim was processed through [facility]- copay amounts may vary at other pharmacies due to pharmacy/plan contracts, or as the patient moves through the different stages of their insurance plan.

## 2023-04-29 ENCOUNTER — Other Ambulatory Visit (HOSPITAL_COMMUNITY): Payer: Self-pay

## 2023-04-30 ENCOUNTER — Other Ambulatory Visit: Payer: Self-pay | Admitting: Internal Medicine

## 2023-04-30 NOTE — Telephone Encounter (Signed)
 NOT [facility] PATIENT

## 2023-05-01 ENCOUNTER — Other Ambulatory Visit (HOSPITAL_COMMUNITY): Payer: Self-pay

## 2023-05-04 ENCOUNTER — Other Ambulatory Visit (HOSPITAL_COMMUNITY): Payer: Self-pay

## 2023-05-05 ENCOUNTER — Other Ambulatory Visit: Payer: Self-pay

## 2023-05-05 ENCOUNTER — Other Ambulatory Visit (HOSPITAL_COMMUNITY): Payer: Self-pay

## 2023-05-05 NOTE — Progress Notes (Signed)
 Specialty Pharmacy Initial Fill Coordination Note  Russell [name] is a 58 y.o. male contacted today regarding initial fill of specialty medication(s) Sofosbuvir-Velpatasvir   Patient requested Russell [name] at [facility] [place] date: 05/06/23   Medication will be filled on 05/06/23.   Patient is aware of $4.00 copayment.

## 2023-05-07 ENCOUNTER — Other Ambulatory Visit (HOSPITAL_COMMUNITY): Payer: Self-pay

## 2023-05-08 ENCOUNTER — Telehealth: Payer: Self-pay | Admitting: Pharmacist

## 2023-05-08 NOTE — Progress Notes (Signed)
 Reached out to patient for counseling; unable to LVM.

## 2023-05-08 NOTE — Telephone Encounter (Signed)
 CM Sheletha Marchuk is working with patient and should be delivering Epclusa to him in person today. Tried to call patient for counseling without success. Unable to LVM. Will try again on Monday.  Margarite Gouge, PharmD, CPP, BCIDP, AAHIVP Clinical Pharmacist Practitioner Infectious Diseases Clinical Pharmacist [facility]

## 2023-05-11 ENCOUNTER — Other Ambulatory Visit: Payer: Self-pay | Admitting: Internal Medicine

## 2023-05-12 NOTE — Telephone Encounter (Signed)
 NOT [facility] PATIENT

## 2023-05-20 ENCOUNTER — Ambulatory Visit

## 2023-05-20 ENCOUNTER — Other Ambulatory Visit (HOSPITAL_COMMUNITY): Payer: Self-pay

## 2023-05-20 ENCOUNTER — Other Ambulatory Visit: Payer: Self-pay

## 2023-05-20 ENCOUNTER — Other Ambulatory Visit: Payer: Self-pay | Admitting: Pharmacist

## 2023-05-20 ENCOUNTER — Telehealth: Payer: Self-pay

## 2023-05-20 NOTE — Telephone Encounter (Signed)
 Spoke to Russell [name] and his sister to counsel on Epclusa  and schedule 50-month follow up with Pharmacy. Doing well on medication so far, no side effects. Encouraged to continue taking with food. Counseled to separate multivitamin from Epclusa .    Scheduled for follow-up on May 8 at 9:45 with Mylinda Asa.   Kristopher Pheasant PharmD Candidate

## 2023-05-20 NOTE — Progress Notes (Signed)
 Specialty Pharmacy Initiation Note   Russell [name] is a 58 y.o. male who will be followed by the specialty pharmacy service for RxSp Hepatitis C    Review of administration, indication, effectiveness, safety, potential side effects, storage/disposable, and missed dose instructions occurred today for patient's specialty medication(s) Sofosbuvir -Velpatasvir      Patient/Caregiver did not have any additional questions or concerns.   Patient's therapy is appropriate to: Initiate    Goals Addressed             This Visit's Progress    Achieve virologic cure as evidenced by SVR       Patient is initiating therapy. Patient will be evaluated at upcoming provider appointment to assess progress      Comply with lab assessments       Patient is initiating therapy. Patient will adhere to provider and/or lab appointments      Maintain optimal adherence to therapy       Patient is initiating therapy. Patient will be evaluated at upcoming provider appointment to assess progress         Sonya Duster Specialty Pharmacist

## 2023-05-26 ENCOUNTER — Other Ambulatory Visit (HOSPITAL_COMMUNITY): Payer: Self-pay

## 2023-05-26 ENCOUNTER — Other Ambulatory Visit: Payer: Self-pay | Admitting: Pharmacy Technician

## 2023-05-26 ENCOUNTER — Other Ambulatory Visit: Payer: Self-pay

## 2023-05-26 NOTE — Progress Notes (Signed)
 Specialty Pharmacy Refill Coordination Note  Russell [name] is a 58 y.o. male contacted today regarding refills of specialty medication(s) Sofosbuvir -Velpatasvir   Spoke with Case Manager   Patient requested Pickup at [facility] at [place] date: 06/03/23   Medication will be filled on 06/02/23.

## 2023-05-26 NOTE — Progress Notes (Signed)
 The ASCVD Risk score (Arnett DK, et al., 2019) failed to calculate for the following reasons:   The valid HDL cholesterol range is 20 to 100 mg/dL   The valid total cholesterol range is 130 to 320 mg/dL  Arlon Bergamo, BSN, RN

## 2023-06-01 NOTE — Progress Notes (Unsigned)
 HPI: Russell [name] is a 58 y.o. male who presents to the [facility] pharmacy clinic for Hepatitis C follow-up.  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   Agitation 02/14/2023   Onychomycosis 02/10/2023   HCV (hepatitis C virus) 02/06/2023   Pulmonary hypertension (HCC) 02/06/2023   Right ventricular systolic dysfunction 02/06/2023   Biological false positive RPR test 02/06/2023   Malnutrition of moderate degree 02/05/2023   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   Homelessness 02/04/2023   Alcoholism (HCC) 02/04/2023   AIDS (HCC) 02/04/2023   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/2023   Pneumococcal bacteremia 02/04/2023   Cholelithiasis without cholecystitis 02/04/2023   Aortic atherosclerosis (HCC) 02/04/2023   Cerebral atrophy (HCC) 02/04/2023   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   BICTEGRAVIR-EMTRICITABINE -TENOFOVIR  AF (BIKTARVY ) 50-200-25 MG TABS TABLET    Take 1 tablet by mouth daily.   FOLIC ACID  (FOLVITE ) 1 MG TABLET    TAKE 1 TABLET BY MOUTH EVERY DAY   MULTIPLE VITAMIN (MULTIVITAMIN WITH MINERALS) TABS TABLET    Take 1 tablet by mouth daily.   OLANZAPINE  ZYDIS (ZYPREXA ) 10 MG DISINTEGRATING TABLET    Take 1 tablet (10 mg total) by mouth at bedtime.   OLANZAPINE  ZYDIS (ZYPREXA ) 5 MG DISINTEGRATING TABLET    Take 1  tablet (5 mg total) by mouth 2 (two) times daily. Take 1 tablet in the morning and 1 tablet in the afternoon.   ROSUVASTATIN  (CRESTOR ) 5 MG TABLET    TAKE 1 TABLET (5 MG TOTAL) BY MOUTH DAILY.   SOFOSBUVIR -VELPATASVIR  (EPCLUSA ) 400-100 MG TABS    Take 1 tablet by mouth daily.   SULFAMETHOXAZOLE -TRIMETHOPRIM  (BACTRIM  DS) 800-160 MG TABLET    Take 1 tablet by mouth daily.  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   Pneumococcal bacteremia 02/04/2023    Social History: Social History   Socioeconomic History   Marital status: Single    Spouse name: Not on file   Number of children: Not on file   Years of education: Not on file   Highest education level: Not on file  Occupational History   Not on file  Tobacco Use   Smoking status: Some Days   Smokeless tobacco: Never  Vaping Use   Vaping status: Never Used  Substance and Sexual Activity   Alcohol use: Yes    Comment: 6 pack a day   Drug use: Yes    Types: Marijuana    Comment: daily  Sexual activity: Not on file    Comment: declined condoms  Other Topics Concern   Not on file  Social History Narrative   Not on file   Social Drivers of Health   Financial Resource Strain: Not on File (05/16/2021)   Received from [employer]    Financial Resource Strain: 0  Food Insecurity: No Food Insecurity (03/17/2023)   Hunger Vital Sign    Worried About Running Out of Food in the Last Year: Never true    Ran Out of Food in the Last Year: Never true  Transportation Needs: No Transportation Needs (03/17/2023)   PRAPARE - Scientist, research (physical sciences) (Medical): No    Lack of Transportation (Non-Medical): No  Physical Activity: Not on File (05/16/2021)   Received from [facility]   Physical Activity    Physical Activity: 0  Stress: Not on File (05/16/2021)   Received from [facility]   Stress    Stress: 0  Social Connections: Not on File (10/11/2022)   Received from [facility]   Social Connections    Connectedness: 0    Labs: Hepatitis C Lab Results  Component Value Date   HCVGENOTYPE 1a 04/22/2023   Hepatitis B Lab Results  Component Value Date   HEPBSAG NON REACTIVE 02/03/2023   Hepatitis A Lab Results  Component Value Date   HAV NON REACTIVE 02/04/2023   HIV Lab Results  Component Value Date   HIV Reactive (A) 02/03/2023   Lab Results  Component Value Date   CREATININE 1.06 03/28/2023   CREATININE 1.00 03/19/2023   CREATININE 1.04 03/16/2023   CREATININE 1.01 03/09/2023   CREATININE 0.96 03/02/2023   Lab Results  Component Value Date   AST 54 (H) 03/28/2023   AST 56 (H) 03/19/2023   AST 77 (H) 02/12/2023   ALT 24 03/28/2023   ALT 25 03/19/2023   ALT 31 02/12/2023   INR 1.2 02/12/2023    Assessment: Canon presents to clinic today for HIV and HCV follow-up. He was initially diagnosed with HIV and HCV in January during [facility] hospital admission for altered mental status and sepsis due to streptococcal bacteremia. At that time, he was started on Biktarvy  and Bactrim  with elevated viral load and low CD4 count. Most recent CD4 count at 179 in March; has not filled Bactrim  this month as he had no refills. Will send more in today to ***. States he has been taking Biktarvy  ***. Viral load undetectable in March. Given consistent fill history, will defer HIV RNA until next follow up with Dr. Shereen Dike in July.   Initially planned on treating patient with 12 weeks of Epclusa , but given combination of thrombocytopenia, encephalopathy, hypoalbuminemia, and cirrhosis scoring, he is characterized as decompensated and will  require 24 weeks of therapy. Will send in an additional 3 refills for patient and follow up with him in 1 month. He has been taking Epclusa  for 4 weeks and has not missed any doses. Has been tolerating the medication well. Will check HCV RNA today along with CMP given slight LFT elevation.  Eligible for multiple vaccines including HAV, HBV, Tdap, PCV20, and Shingles which he *** today.    Plan: - Check HCV RNA and CMP  - Continue Epclusa  for 20 weeks - Continue Biktarvy   - Continue Bactrim  (refills sent) - Follow up with me on *** for HCV monitoring - Follow up with Dr. Shereen Dike on 7/1 for HIV and HCV monitoring   Nicklas Barns,  PharmD, CPP, BCIDP, AAHIVP Clinical Pharmacist Practitioner Infectious Diseases Clinical Pharmacist [facility] 06/01/2023, 3:39 PM

## 2023-06-02 ENCOUNTER — Other Ambulatory Visit (HOSPITAL_COMMUNITY): Payer: Self-pay

## 2023-06-02 ENCOUNTER — Other Ambulatory Visit: Payer: Self-pay

## 2023-06-04 ENCOUNTER — Ambulatory Visit: Admitting: Pharmacist

## 2023-06-04 ENCOUNTER — Other Ambulatory Visit: Payer: Self-pay | Admitting: Pharmacist

## 2023-06-04 ENCOUNTER — Other Ambulatory Visit: Payer: Self-pay

## 2023-06-04 ENCOUNTER — Other Ambulatory Visit (HOSPITAL_COMMUNITY): Payer: Self-pay

## 2023-06-04 DIAGNOSIS — Z23 Encounter for immunization: Secondary | ICD-10-CM

## 2023-06-04 DIAGNOSIS — B20 Human immunodeficiency virus [HIV] disease: Secondary | ICD-10-CM

## 2023-06-04 DIAGNOSIS — B182 Chronic viral hepatitis C: Secondary | ICD-10-CM

## 2023-06-04 MED ORDER — SOFOSBUVIR-VELPATASVIR 400-100 MG PO TABS
1.0000 | ORAL_TABLET | Freq: Every day | ORAL | 2 refills | Status: DC
  Filled 2023-06-04: qty 28, 28d supply, fill #0

## 2023-06-04 MED ORDER — SOFOSBUVIR-VELPATASVIR 400-100 MG PO TABS
1.0000 | ORAL_TABLET | Freq: Every day | ORAL | 3 refills | Status: DC
  Filled 2023-06-04 – 2023-06-24 (×2): qty 28, 28d supply, fill #0
  Filled 2023-07-22: qty 28, 28d supply, fill #1
  Filled 2023-08-17: qty 28, 28d supply, fill #2
  Filled 2023-09-18: qty 28, 28d supply, fill #3

## 2023-06-04 MED ORDER — BICTEGRAVIR-EMTRICITAB-TENOFOV 50-200-25 MG PO TABS
1.0000 | ORAL_TABLET | Freq: Every day | ORAL | 3 refills | Status: DC
  Filled 2023-06-04: qty 30, 30d supply, fill #0
  Filled 2023-06-24: qty 30, 30d supply, fill #1
  Filled 2023-07-22: qty 30, 30d supply, fill #2
  Filled 2023-08-17: qty 30, 30d supply, fill #3

## 2023-06-04 MED ORDER — SULFAMETHOXAZOLE-TRIMETHOPRIM 800-160 MG PO TABS
1.0000 | ORAL_TABLET | Freq: Every day | ORAL | 3 refills | Status: DC
  Filled 2023-06-04: qty 30, 30d supply, fill #0
  Filled 2023-06-24: qty 30, 30d supply, fill #1
  Filled 2023-07-24: qty 30, 30d supply, fill #2
  Filled 2023-08-17: qty 30, 30d supply, fill #3

## 2023-06-04 NOTE — Progress Notes (Signed)
 Specialty Pharmacy Initiation Note   Russell [name] is a 58 y.o. male who will be followed by the specialty pharmacy service for RxSp HIV    Review of administration, indication, effectiveness, safety, potential side effects, storage/disposable, and missed dose instructions occurred today for patient's specialty medication(s) Bictegravir-Emtricitab-Tenofov (BIKTARVY )     Patient/Caregiver did not have any additional questions or concerns.   Patient's therapy is appropriate to: Continue    Goals Addressed             This Visit's Progress    Achieve Undetectable HIV Viral Load < 20       Patient is on track. Patient will maintain adherence      Increase CD4 count until steady state       Patient is not on track and improving. Patient will be evaluated at upcoming provider appointment to assess progress         Sonya Duster Specialty Pharmacist

## 2023-06-04 NOTE — Progress Notes (Signed)
 Specialty Pharmacy Initial Fill Coordination Note  Russell [name] is a 58 y.o. male contacted today regarding initial fill of specialty medication(s) Bictegravir-Emtricitab-Tenofov (BIKTARVY )   Patient requested [name] at [facility] at [place] date: 06/04/23   Medication will be filled on 06/04/23.   Patient is aware of 0.00 copayment.

## 2023-06-05 ENCOUNTER — Other Ambulatory Visit: Payer: Self-pay | Admitting: Internal Medicine

## 2023-06-05 DIAGNOSIS — B20 Human immunodeficiency virus [HIV] disease: Secondary | ICD-10-CM

## 2023-06-06 LAB — HEPATITIS C RNA QUANTITATIVE
HCV Quantitative Log: 1.61 {Log_IU}/mL — ABNORMAL HIGH
HCV RNA, PCR, QN: 41 [IU]/mL — ABNORMAL HIGH

## 2023-06-06 LAB — COMPREHENSIVE METABOLIC PANEL WITH GFR
AG Ratio: 0.6 (calc) — ABNORMAL LOW (ref 1.0–2.5)
ALT: 11 U/L (ref 9–46)
AST: 29 U/L (ref 10–35)
Albumin: 3.1 g/dL — ABNORMAL LOW (ref 3.6–5.1)
Alkaline phosphatase (APISO): 115 U/L (ref 35–144)
BUN: 15 mg/dL (ref 7–25)
CO2: 24 mmol/L (ref 20–32)
Calcium: 9.1 mg/dL (ref 8.6–10.3)
Chloride: 109 mmol/L (ref 98–110)
Creat: 1.13 mg/dL (ref 0.70–1.35)
Globulin: 5.2 g/dL — ABNORMAL HIGH (ref 1.9–3.7)
Glucose, Bld: 98 mg/dL (ref 65–99)
Potassium: 3.7 mmol/L (ref 3.5–5.3)
Sodium: 137 mmol/L (ref 135–146)
Total Bilirubin: 1.4 mg/dL — ABNORMAL HIGH (ref 0.2–1.2)
Total Protein: 8.3 g/dL — ABNORMAL HIGH (ref 6.1–8.1)
eGFR: 74 mL/min/{1.73_m2}

## 2023-06-06 LAB — CBC WITH DIFFERENTIAL/PLATELET
Absolute Lymphocytes: 1415 {cells}/uL (ref 850–3900)
Absolute Monocytes: 392 {cells}/uL (ref 200–950)
Basophils Absolute: 29 {cells}/uL (ref 0–200)
Basophils Relative: 0.8 %
Eosinophils Absolute: 220 {cells}/uL (ref 15–500)
Eosinophils Relative: 6.1 %
HCT: 40.8 % (ref 38.5–50.0)
Hemoglobin: 14 g/dL (ref 13.2–17.1)
MCH: 33.2 pg — ABNORMAL HIGH (ref 27.0–33.0)
MCHC: 34.3 g/dL (ref 32.0–36.0)
MCV: 96.7 fL (ref 80.0–100.0)
MPV: 11.9 fL (ref 7.5–12.5)
Monocytes Relative: 10.9 %
Neutro Abs: 1544 {cells}/uL (ref 1500–7800)
Neutrophils Relative %: 42.9 %
Platelets: 85 10*3/uL — ABNORMAL LOW (ref 140–400)
RBC: 4.22 10*6/uL (ref 4.20–5.80)
RDW: 12.7 % (ref 11.0–15.0)
Total Lymphocyte: 39.3 %
WBC: 3.6 10*3/uL — ABNORMAL LOW (ref 3.8–10.8)

## 2023-06-06 LAB — HIV-1 RNA QUANT-NO REFLEX-BLD
HIV 1 RNA Quant: <20 {copies}/mL — AB
HIV-1 RNA Quant, Log: <1.3 {Log_copies}/mL — AB

## 2023-06-08 ENCOUNTER — Other Ambulatory Visit (HOSPITAL_COMMUNITY): Payer: Self-pay

## 2023-06-08 NOTE — Telephone Encounter (Signed)
 NOT [facility] PATIENT

## 2023-06-08 NOTE — Progress Notes (Signed)
 Previous Dr. Seymour Dapper patient - scheduled to see you in July. Decided to treat as decompensated with 24 weeks of Epclusa  - about 4 weeks in. Viral load decreasing nicely and not quite UD yet. PLTs slightly decreased and bili slightly increased. Just FYI - thank you!

## 2023-06-09 ENCOUNTER — Ambulatory Visit

## 2023-06-09 ENCOUNTER — Ambulatory Visit (INDEPENDENT_AMBULATORY_CARE_PROVIDER_SITE_OTHER): Admitting: Podiatry

## 2023-06-09 ENCOUNTER — Ambulatory Visit (INDEPENDENT_AMBULATORY_CARE_PROVIDER_SITE_OTHER)

## 2023-06-09 ENCOUNTER — Other Ambulatory Visit: Payer: Self-pay | Admitting: Internal Medicine

## 2023-06-09 DIAGNOSIS — M79674 Pain in right toe(s): Secondary | ICD-10-CM

## 2023-06-09 DIAGNOSIS — B351 Tinea unguium: Secondary | ICD-10-CM

## 2023-06-09 DIAGNOSIS — M7752 Other enthesopathy of left foot: Secondary | ICD-10-CM

## 2023-06-09 DIAGNOSIS — M79675 Pain in left toe(s): Secondary | ICD-10-CM | POA: Diagnosis not present

## 2023-06-09 DIAGNOSIS — L602 Onychogryphosis: Secondary | ICD-10-CM | POA: Diagnosis not present

## 2023-06-09 NOTE — Telephone Encounter (Signed)
 NOT [facility] PATIENT

## 2023-06-09 NOTE — Progress Notes (Signed)
 Subjective:  Patient ID: Russell [name], male    DOB: [birth year],  MRN: [id number]  Dray A Ator presents to clinic today for: Patient notes nails are thick, discolored, elongated and painful in shoegear when trying to ambulate.  Patient does not recall how long it has been since his nails were trimmed.  They are very uncomfortable in shoes.  He is no longer able to trim them himself due to the to the thickness and pain.  3 we softening solution had to be placed on the toes for approximately 30 minutes to help soften the extremely thickened and tender toenails.  Patient was slightly impatient during this time.    Upon entering the room today and introducing myself, the patient stated he was expecting a male doctor.  He stated that "they should send a man in to [name] a man's job".  Offered to reschedule the patient with a male physician but he declined.  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  Objective:  Vascular Examination: Capillary refill time is 3-5 seconds to toes bilateral. Palpable pedal pulses b/l LE. Digital hair present b/l.  Skin temperature gradient WNL b/l. No varicosities b/l. No cyanosis noted b/l.   Dermatological Examination: Pedal skin with normal turgor, texture and tone b/l. No open wounds. No interdigital macerations b/l. Toenails x10 are approximately 12-15  mm thick, discolored, dystrophic with subungual debris. There is pain with compression of the nail plates.  They are elongated x10.  The right third toenail is quite lytic and tender.  There is some maceration able to be visualized near the nail bed.  There is a musty odor to this area.  Assessment/Plan: 1. Pain due to onychomycosis of toenails of both feet   2. Capsulitis of toe, left   3. Onychogryphosis    After allowing the toenails to soak in softening solution to ease patient discomfort and facilitate debridement of toenails, the mycotic/gryphotic toenails were sharply debrided x9 with sterile nail nippers and a power debriding burr to decrease bulk/thickness and length.    The right third toenail was debrided to the eponychium and removed in toto.  There was some discomfort during debridement, but the nail was able to be removed fairly easily.  The macerated base was then scrubbed with iodine soft scrub brush.  Antibiotic ointment and a light gauze dressing were applied to the nailbed.  Patient encouraged to perform Epsom salt soaks daily to this area.  If this does not dry out and scab over, he should follow-up for reevaluation of this toe.  Strongly recommend that the patient follow-up every 3 to 6 months for nail debridement so that they [name] not get this bad in the future.  Joe Murders, DPM, FACFAS [facility]     [address]  [place], [place] [address]                Office [phone]  Fax [phone]

## 2023-06-15 ENCOUNTER — Other Ambulatory Visit: Payer: Self-pay | Admitting: Internal Medicine

## 2023-06-16 NOTE — Telephone Encounter (Signed)
 NOT [facility] PATIENT

## 2023-06-17 ENCOUNTER — Other Ambulatory Visit (HOSPITAL_COMMUNITY): Payer: Self-pay

## 2023-06-18 ENCOUNTER — Telehealth: Payer: Self-pay | Admitting: *Deleted

## 2023-06-18 NOTE — Telephone Encounter (Signed)
 Will forward to Jada Ellison.  Copied from CRM [id number]. Topic: Clinical - Prescription Issue >> Jun 18, 2023 10:48 AM Blair Bumpers wrote: Reason for CRM: Russell [name], with [facility], called stating that patient is having problems getting his medication. The [facility] pharmacy is stating that they need authorizations for the OLANZapine  zydis (ZYPREXA ) 10 MG disintegrating tablet & OLANZapine  zydis (ZYPREXA ) 5 MG disintegrating tablet. Once this has been taken care of & sent to pharmacy, Moira Andrews is asking if someone can notify patient's sister, Brandan Robicheaux. Her number is [phone].

## 2023-06-18 NOTE — Telephone Encounter (Signed)
 RTC to sister informed her that the PA on both medications have been approved and that she should be able to get the medication.

## 2023-06-18 NOTE — Telephone Encounter (Signed)
 Prior Authorization for both of the medications have already been submitted and approved. Please see note from 04/06/23.

## 2023-06-19 ENCOUNTER — Other Ambulatory Visit: Payer: Self-pay | Admitting: Internal Medicine

## 2023-06-19 ENCOUNTER — Other Ambulatory Visit (HOSPITAL_COMMUNITY): Payer: Self-pay

## 2023-06-19 NOTE — Telephone Encounter (Signed)
 NOT [facility] PATIENT

## 2023-06-23 ENCOUNTER — Other Ambulatory Visit: Payer: Self-pay | Admitting: Student

## 2023-06-23 ENCOUNTER — Telehealth: Payer: Self-pay | Admitting: *Deleted

## 2023-06-23 DIAGNOSIS — B20 Human immunodeficiency virus [HIV] disease: Secondary | ICD-10-CM

## 2023-06-23 MED ORDER — OLANZAPINE 10 MG PO TBDP: 10.0000 mg | ORAL_TABLET | Freq: Every day | ORAL | 0 refills | Status: AC

## 2023-06-23 MED ORDER — OLANZAPINE 5 MG PO TBDP: 5.0000 mg | ORAL_TABLET | Freq: Two times a day (BID) | ORAL | 0 refills | Status: AC

## 2023-06-23 NOTE — Telephone Encounter (Signed)
 Will forward to Dr. Shelli Dexter Copied from CRM [id number]. Topic: Clinical - Prescription Issue >> Jun 23, 2023 11:57 AM Russell [name] wrote: Reason for CRM: Patient caseworker Russell R. Was calling because patient is needing his medicine Olanzapine , he is not a IM patient but one of the providers gave him this medicine when he was in the ER and she is wanting to know can he wait until June 3rd and not take it until he goes to his pcp, could you please give her a call back at [phone].

## 2023-06-23 NOTE — Telephone Encounter (Unsigned)
 Copied from CRM [id number]. Topic: Clinical - Prescription Issue >> Jun 23, 2023 11:57 AM Russell [name] wrote: Reason for CRM: Patient caseworker Sheletha R. Was calling because patient is needing his medicine Olanzapine , he is not a IM patient but one of the providers gave him this medicine when he was in the ER and she is wanting to know can he wait until June 3rd and not take it until he goes to his pcp, could you please give her a call back at [phone].

## 2023-06-23 NOTE — Telephone Encounter (Signed)
 Call to [facility] patient's Case Worker.  Informed her that the prescriptions for the Olanzapine  have been sent to the Pharmacy.

## 2023-06-23 NOTE — Progress Notes (Signed)
 Refilling olanzapine  prescribed by our team on discharge from hospital until this person can establish with new primary care provider.  Adria Hopkins MD 06/23/2023, 2:10 PM

## 2023-06-24 ENCOUNTER — Other Ambulatory Visit: Payer: Self-pay | Admitting: Pharmacy Technician

## 2023-06-24 ENCOUNTER — Other Ambulatory Visit: Payer: Self-pay

## 2023-06-24 NOTE — Progress Notes (Signed)
 Specialty Pharmacy Refill Coordination Note  Russell [name] is a 58 y.o. male contacted today regarding refills of specialty medication(s) Sofosbuvir -Velpatasvir ; Bictegravir-Emtricitab-Tenofov (BIKTARVY )  Spoke with Caseworker  Patient requested [name] at [facility] at [place] date: 06/30/23   Medication will be filled on 06/29/23.

## 2023-06-29 ENCOUNTER — Other Ambulatory Visit: Payer: Self-pay

## 2023-06-30 ENCOUNTER — Other Ambulatory Visit (HOSPITAL_COMMUNITY): Payer: Self-pay

## 2023-07-09 ENCOUNTER — Encounter: Payer: Self-pay | Admitting: Internal Medicine

## 2023-07-09 ENCOUNTER — Ambulatory Visit: Admitting: Internal Medicine

## 2023-07-09 ENCOUNTER — Other Ambulatory Visit: Payer: Self-pay

## 2023-07-09 VITALS — BP 107/72 | HR 82 | Temp 97.9°F | Wt 144.0 lb

## 2023-07-09 DIAGNOSIS — B1920 Unspecified viral hepatitis C without hepatic coma: Secondary | ICD-10-CM | POA: Diagnosis not present

## 2023-07-09 DIAGNOSIS — B20 Human immunodeficiency virus [HIV] disease: Secondary | ICD-10-CM

## 2023-07-09 DIAGNOSIS — B182 Chronic viral hepatitis C: Secondary | ICD-10-CM

## 2023-07-09 LAB — COLOGUARD

## 2023-07-09 NOTE — Progress Notes (Signed)
   Subjective:    Patient ID: Russell [name], male    DOB: 02/04/1965, 58 y.o.   MRN: [id number]  HPI [name] is here to establish care for HIV and for hospital follow-up. He was initially hospitalized in January for pneumonia and found to have streptococcal pneumonia bacteremia.  He was also found to be HIV positive with a low CD4 count of 116.  He was treated for the bacteremia.  He also had a positive RPR though T. pallidum antibody was negative.  He was given treatment for late latent syphilis.  An MRI of his brain did note some age advanced cerebral atrophy consistent with HIV dementia.  He is here with his sister who is his caregiver.  He is taking his medications.  Has not yet established with a primary physician.   07/09/23 id clinic f/u Patient staying with his sister who prepares his medication Patient doesn't remember his medications Patient is not working He has not complaint today He is not sexually active (at least not this year). heterosexual  Reviewed labs -- hcv rna positive/gt 1; he didn't know about hep c   Review of Systems  Constitutional:  Negative for fatigue.  Gastrointestinal:  Negative for diarrhea and nausea.  Skin:  Negative for rash.       Objective:   Physical Exam  Eyes:     General: No scleral icterus.  Pulmonary:     Effort: Pulmonary effort is normal.   Neurological:     Mental Status: He is alert.   SH: + tobacco -- quit doesn't remember date Substance -- no drugs/etoh  Labs: Lab Results  Component Value Date   WBC 3.6 (L) 06/04/2023   HGB 14.0 06/04/2023   HCT 40.8 06/04/2023   MCV 96.7 06/04/2023   PLT 85 (L) 06/04/2023   Last metabolic panel Lab Results  Component Value Date   GLUCOSE 98 06/04/2023   NA 137 06/04/2023   K 3.7 06/04/2023   CL 109 06/04/2023   CO2 24 06/04/2023   BUN 15 06/04/2023   CREATININE 1.13 06/04/2023   EGFR 74 06/04/2023   CALCIUM  9.1 06/04/2023   PHOS 3.0 03/02/2023   PROT 8.3 (H) 06/04/2023    ALBUMIN  2.2 (L) 03/28/2023   BILITOT 1.4 (H) 06/04/2023   ALKPHOS 79 03/28/2023   AST 29 06/04/2023   ALT 11 06/04/2023   ANIONGAP 9 03/28/2023   Imaging: Reviewed       Assessment & Plan:   #hiv #cad risk Dx'ed 01/2023 Heterosexual No ivdu hx Cd4 nadir 116 Bactrim  prophy  Well controlled on biktarvy    -he is on rosuvastatin  as per reprieve trial recommendation -discussed u=u -encourage compliance -continue current HIV medication -labs reviewed -f/u in 6 months    #hcv Gt1 On epclusa ; started 05/08/23; planned 6 months for decompensated cirrhosis based on fib-4  Pharmacy team following F/u pharmacy as discussed -- chart forwarded to pharmacy team   #hcm Will review -vaccine -hepatitis -std No results found for: RPR Not sexually active -cancer screening Defer to pcp

## 2023-07-09 NOTE — Patient Instructions (Signed)
 Continue biktarvy  for hiv  Continue epclusa  for hepatitis c   Follow up with me (dr Shereen Dike) for hiv care in 6 months    Follow up with pharmacy for hep c treatment every month

## 2023-07-10 ENCOUNTER — Other Ambulatory Visit (HOSPITAL_COMMUNITY): Payer: Self-pay

## 2023-07-15 ENCOUNTER — Other Ambulatory Visit: Payer: Self-pay | Admitting: Student

## 2023-07-15 DIAGNOSIS — B20 Human immunodeficiency virus [HIV] disease: Secondary | ICD-10-CM

## 2023-07-15 NOTE — Telephone Encounter (Signed)
 This person is not an IMTS patient.

## 2023-07-20 ENCOUNTER — Other Ambulatory Visit: Payer: Self-pay | Admitting: Podiatry

## 2023-07-20 ENCOUNTER — Other Ambulatory Visit: Payer: Self-pay

## 2023-07-20 ENCOUNTER — Ambulatory Visit (INDEPENDENT_AMBULATORY_CARE_PROVIDER_SITE_OTHER): Admitting: Podiatry

## 2023-07-20 ENCOUNTER — Ambulatory Visit (INDEPENDENT_AMBULATORY_CARE_PROVIDER_SITE_OTHER)

## 2023-07-20 DIAGNOSIS — M79671 Pain in right foot: Secondary | ICD-10-CM

## 2023-07-20 DIAGNOSIS — L853 Xerosis cutis: Secondary | ICD-10-CM | POA: Diagnosis not present

## 2023-07-20 DIAGNOSIS — S90111A Contusion of right great toe without damage to nail, initial encounter: Secondary | ICD-10-CM | POA: Diagnosis not present

## 2023-07-20 DIAGNOSIS — M25571 Pain in right ankle and joints of right foot: Secondary | ICD-10-CM | POA: Diagnosis not present

## 2023-07-20 DIAGNOSIS — M1990 Unspecified osteoarthritis, unspecified site: Secondary | ICD-10-CM

## 2023-07-20 MED ORDER — KETOCONAZOLE 2 % EX CREA: 1.0000 | TOPICAL_CREAM | Freq: Every day | CUTANEOUS | 2 refills | Status: AC

## 2023-07-20 MED ORDER — MELOXICAM 7.5 MG PO TABS: 7.5000 mg | ORAL_TABLET | Freq: Every day | ORAL | 0 refills | Status: DC

## 2023-07-20 NOTE — Progress Notes (Unsigned)
 Chief Complaint  Patient presents with   Foot Pain    Pt stated that he has some pain in his right foot    HPI: 58 y.o. male presents today, with a male companion, with concern of pain at the right great toe joint.  He stated that it was from a basketball injury, but his male companion stated that he does not play any sports and told him to stop lying to us .  He then responded to her telling her to shut her mouth and that he did not know why she needed to be back in the exam room.  He is also requesting something for the dry skin on his feet.  He states the pain has been present ever since the injury a few weeks ago.  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;  Service: Orthopedics;  Laterality: Right;   No Known Allergies Review of Systems  Musculoskeletal:  Positive for joint pain.     Physical Exam: There are palpable pedal pulses to the right foot.  Minimal localized edema to the first MPJ.  No erythema or calor noted.  There is dryness and scaling of the skin bilateral legs and feet.  There is pain on palpation to the dorsal and medial aspect of the joint as well as pain with range of motion of the joint.  No crepitus is noted.  Epicritic sensation is intact  Radiographic Exam (right foot, 3 weightbearing views, 07/20/2023):  Normal osseous mineralization. No fractures noted.  Significant joint space narrowing with squaring of the metatarsal head at the first MPJ.   Degenerative joint changes are noted.  Increase in hallux abductus interphalangeus angle.    Assessment/Plan of Care: 1. Contusion of right great toe without damage to nail, initial encounter   2. Right foot pain   3. Pain in joint of right foot   4. Xerosis of skin      Meds ordered this encounter  Medications   meloxicam (MOBIC) 7.5 MG tablet    Sig: Take 1 tablet (7.5 mg total) by mouth daily.    Dispense:  30 tablet    Refill:  0   ketoconazole  (NIZORAL ) 2 % cream    Sig: Apply 1 Application topically daily. Apply 1gm daily to skin on both feet.    Dispense:  60 g    Refill:  2   I reviewed the clinical and radiographic findings with the patient and his companion today.  Offered a cortisone injection for the contusion/pain/arthritic changes in the joint.  He declined.  Sent in prescription for meloxicam 7.5 mg 1 tablet p.o. daily for up to 30 days.  Also sent in prescription for ketoconazole  2% cream to apply once daily to the skin on the legs and feet once daily.  Follow-up as scheduled with Dr. Loreda for routine footcare.  He noted on his first visit with me that he prefers/expects male physicians.   Awanda CHARM Imperial, DPM, FACFAS [facility]     [address] [place], [place] [address]                Office [phone]  Fax [phone]

## 2023-07-21 ENCOUNTER — Other Ambulatory Visit: Payer: Self-pay | Admitting: Student

## 2023-07-21 ENCOUNTER — Other Ambulatory Visit (HOSPITAL_COMMUNITY): Payer: Self-pay

## 2023-07-21 DIAGNOSIS — B20 Human immunodeficiency virus [HIV] disease: Secondary | ICD-10-CM

## 2023-07-21 NOTE — Telephone Encounter (Signed)
 NOT [facility] PATIENT

## 2023-07-22 ENCOUNTER — Other Ambulatory Visit: Payer: Self-pay

## 2023-07-22 ENCOUNTER — Other Ambulatory Visit (HOSPITAL_COMMUNITY): Payer: Self-pay

## 2023-07-22 NOTE — Progress Notes (Signed)
 Specialty Pharmacy Refill Coordination Note  Spoke with [facility] (Case Manager).   Russell [name] is a 58 y.o. male contacted today regarding refills of specialty medication(s) Sofosbuvir -Velpatasvir   Patient requested: [name] at [facility] at [place] date: 07/27/23  Medication will be filled on 07/24/23.

## 2023-07-22 NOTE — Progress Notes (Signed)
 Specialty Pharmacy Refill Coordination Note  Spoke with [facility] (Case Manager).   Kuron A Wendt is a 58 y.o. male contacted today regarding refills of specialty medication(s) Bictegravir-Emtricitab-Tenofov (BIKTARVY )  Patient requested: [name] at [facility] at [place] date: 07/27/23  Medication will be filled on 07/24/23.

## 2023-07-23 ENCOUNTER — Other Ambulatory Visit (HOSPITAL_COMMUNITY): Payer: Self-pay

## 2023-07-23 ENCOUNTER — Other Ambulatory Visit: Payer: Self-pay

## 2023-07-24 ENCOUNTER — Other Ambulatory Visit (HOSPITAL_COMMUNITY): Payer: Self-pay

## 2023-07-24 ENCOUNTER — Other Ambulatory Visit: Payer: Self-pay

## 2023-07-28 ENCOUNTER — Ambulatory Visit: Admitting: Infectious Diseases

## 2023-07-28 ENCOUNTER — Ambulatory Visit: Admitting: Internal Medicine

## 2023-08-12 LAB — COLOGUARD: COLOGUARD: NEGATIVE

## 2023-08-16 ENCOUNTER — Other Ambulatory Visit: Payer: Self-pay | Admitting: Podiatry

## 2023-08-17 ENCOUNTER — Other Ambulatory Visit: Payer: Self-pay

## 2023-08-17 NOTE — Progress Notes (Signed)
 Specialty Pharmacy Refill Coordination Note  Russell [name] is a 58 y.o. male contacted today regarding refills of specialty medication(s) Bictegravir-Emtricitab-Tenofov (BIKTARVY ); Sofosbuvir -Velpatasvir    Patient requested Pickup at [facility] at [place] date: 08/25/23   Medication will be filled on 08/24/23.

## 2023-08-22 ENCOUNTER — Other Ambulatory Visit (HOSPITAL_COMMUNITY): Payer: Self-pay

## 2023-08-24 ENCOUNTER — Other Ambulatory Visit: Payer: Self-pay

## 2023-08-26 ENCOUNTER — Other Ambulatory Visit (HOSPITAL_COMMUNITY): Payer: Self-pay

## 2023-09-14 ENCOUNTER — Encounter: Payer: Self-pay | Admitting: Podiatry

## 2023-09-14 ENCOUNTER — Ambulatory Visit (INDEPENDENT_AMBULATORY_CARE_PROVIDER_SITE_OTHER): Admitting: Podiatry

## 2023-09-14 DIAGNOSIS — D696 Thrombocytopenia, unspecified: Secondary | ICD-10-CM

## 2023-09-14 DIAGNOSIS — M79674 Pain in right toe(s): Secondary | ICD-10-CM

## 2023-09-14 DIAGNOSIS — B351 Tinea unguium: Secondary | ICD-10-CM | POA: Diagnosis not present

## 2023-09-14 DIAGNOSIS — M79675 Pain in left toe(s): Secondary | ICD-10-CM

## 2023-09-14 NOTE — Progress Notes (Signed)
 This patient returns to my office for at risk foot care.  This patient requires this care by a professional since this patient will be at risk due to having thrombocytopenia. This patient is unable to cut nails himself since the patient cannot reach his nails.These nails are painful walking and wearing shoes.  This patient presents for at risk foot care today.  General Appearance  Alert, conversant and in no acute stress.  Vascular  Dorsalis pedis and posterior tibial  pulses are palpable  bilaterally.  Capillary return is within normal limits  bilaterally. Temperature is within normal limits  bilaterally.  Neurologic  Senn-Weinstein monofilament wire test within normal limits  bilaterally. Muscle power within normal limits bilaterally.  Nails Thick disfigured discolored nails with subungual debris  from hallux to fifth toes bilaterally. No evidence of bacterial infection or drainage bilaterally. Third left and right hallux nail self avulsed.  Orthopedic  No limitations of motion  feet .  No crepitus or effusions noted.  No bony pathology or digital deformities noted.  Skin  normotropic skin with no porokeratosis noted bilaterally.  No signs of infections or ulcers noted.     Onychomycosis  Pain in right toes  Pain in left toes  IE.  Consent was obtained for treatment procedures.   Mechanical debridement of nails 1-5  bilaterally performed with a nail nipper.  Filed with dremel without incident.    Return office visit    10 weeks                  Told patient to return for periodic foot care and evaluation due to potential at risk complications.   Cordella Bold DPM

## 2023-09-16 ENCOUNTER — Other Ambulatory Visit: Payer: Self-pay

## 2023-09-18 ENCOUNTER — Other Ambulatory Visit: Payer: Self-pay

## 2023-09-18 ENCOUNTER — Other Ambulatory Visit: Payer: Self-pay | Admitting: Pharmacist

## 2023-09-18 DIAGNOSIS — B20 Human immunodeficiency virus [HIV] disease: Secondary | ICD-10-CM

## 2023-09-18 MED ORDER — SULFAMETHOXAZOLE-TRIMETHOPRIM 800-160 MG PO TABS
1.0000 | ORAL_TABLET | Freq: Every day | ORAL | 3 refills | Status: AC
  Filled 2023-09-18: qty 30, 30d supply, fill #0
  Filled 2023-10-16: qty 30, 30d supply, fill #1
  Filled 2023-12-11: qty 30, 30d supply, fill #2
  Filled 2024-01-26: qty 30, 30d supply, fill #3

## 2023-09-18 MED ORDER — BIKTARVY 50-200-25 MG PO TABS
1.0000 | ORAL_TABLET | Freq: Every day | ORAL | 3 refills | Status: AC
  Filled 2023-09-18: qty 30, 30d supply, fill #0
  Filled 2023-10-16: qty 30, 30d supply, fill #1
  Filled 2023-11-13: qty 30, 30d supply, fill #2
  Filled 2023-12-11: qty 30, 30d supply, fill #3

## 2023-09-18 NOTE — Progress Notes (Signed)
 Specialty Pharmacy Refill Coordination Note  Russell [name] is a 58 y.o. male contacted today regarding refills of specialty medication(s) Bictegravir-Emtricitab-Tenofov (BIKTARVY ); Sofosbuvir -Velpatasvir    Patient requested Pickup at [facility] at [place] date: 09/23/23   Medication will be filled on 09/21/2023 This fill date is pending response to refill request from provider. Patient is aware and if they have not received fill by intended date they must follow up with pharmacy.

## 2023-09-18 NOTE — Progress Notes (Signed)
 Specialty Pharmacy Ongoing Clinical Assessment Note  Russell [name] is a 58 y.o. male who is being followed by the specialty pharmacy service for Multiple active episodes found   Patient's specialty medication(s) reviewed today: Bictegravir-Emtricitab-Tenofov (BIKTARVY ); Sofosbuvir -Velpatasvir    Missed doses in the last 4 weeks: 0   Patient/Caregiver did not have any additional questions or concerns.   Therapeutic benefit summary: Patient is achieving benefit   Adverse events/side effects summary: No adverse events/side effects   Patient's therapy is appropriate to: Continue    Goals Addressed             This Visit's Progress    Achieve Undetectable HIV Viral Load < 20   On track    Patient is on track. Patient will maintain adherence. Labs 06/04/23 VL undetectable.        Follow up: 3 months  Powell CHRISTELLA Gallus Specialty Pharmacist

## 2023-09-23 ENCOUNTER — Other Ambulatory Visit: Payer: Self-pay

## 2023-09-30 ENCOUNTER — Ambulatory Visit

## 2023-10-14 ENCOUNTER — Other Ambulatory Visit: Payer: Self-pay

## 2023-10-16 ENCOUNTER — Other Ambulatory Visit: Payer: Self-pay

## 2023-10-16 NOTE — Progress Notes (Signed)
 Specialty Pharmacy Refill Coordination Note  Rohnan Bartleson Messina is a 58 y.o. male contacted today regarding refills of specialty medication(s) Bictegravir-Emtricitab-Tenofov (Biktarvy )  Spoke with patient's Case Manager Lorre)  Patient requested [name] at [facility] at [place] date: 10/23/23   Medication will be filled on 09.25.25.     All epclusa  fills have been dispensed for the total 24 weeks of therapy.

## 2023-10-22 ENCOUNTER — Other Ambulatory Visit: Payer: Self-pay

## 2023-10-22 ENCOUNTER — Other Ambulatory Visit (HOSPITAL_COMMUNITY): Payer: Self-pay

## 2023-11-13 ENCOUNTER — Other Ambulatory Visit: Payer: Self-pay

## 2023-11-13 NOTE — Progress Notes (Signed)
 Specialty Pharmacy Refill Coordination Note  Russell [name] is a 58 y.o. male contacted today regarding refills of specialty medication(s) Bictegravir-Emtricitab-Tenofov (Biktarvy )   Patient requested [name] at [facility] at [place] date: 11/16/23   Medication will be filled on 11/13/23.

## 2023-11-17 ENCOUNTER — Ambulatory Visit: Admitting: Diagnostic Neuroimaging

## 2023-11-17 ENCOUNTER — Encounter: Payer: Self-pay | Admitting: Diagnostic Neuroimaging

## 2023-11-17 VITALS — BP 128/77 | HR 87 | Ht 67.0 in | Wt 154.0 lb

## 2023-11-17 DIAGNOSIS — F0280 Dementia in other diseases classified elsewhere without behavioral disturbance: Secondary | ICD-10-CM | POA: Diagnosis not present

## 2023-11-17 DIAGNOSIS — B20 Human immunodeficiency virus [HIV] disease: Secondary | ICD-10-CM

## 2023-11-17 NOTE — Patient Instructions (Addendum)
  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 - aim for at least 7-8 hours sleep per night (or more) - avoid smoking and alcohol - needs supervision of medications, finances; no driving - safety / supervision issues reviewed

## 2023-11-17 NOTE — Progress Notes (Signed)
 [facility]  PATIENT: Russell [name] DOB: Jun 20, 1965  REFERRING CLINICIAN: Earvin [name] PARAS, FNP HISTORY FROM: patient  REASON FOR VISIT: new consult   HISTORICAL  CHIEF COMPLAINT:  Chief Complaint  Patient presents with   Memory Loss    Rm 6 with sister  Pt is well, reports no personal concerns with his memory.     HISTORY OF PRESENT ILLNESS:   58 year old male here for evaluation of memory and cognitive decline.  Patient accompanied by sister.  Patient has previously been living on the streets, history of substance abuse including alcohol and other drugs.  January 2025 had significant worsening of confusion and physical decline.  He was admitted to the hospital for 2 months.  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   Now patient is doing slightly better, but not back to baseline.  He continues to have memory cognitive issues.  He is living with his sister currently.  Case manager working with patient requested neurology consultation to evaluate for HIV dementia.   REVIEW OF SYSTEMS: Full 14 system review of systems performed and negative with exception of: As per HPI.  ALLERGIES: No Known Allergies  HOME MEDICATIONS: Outpatient Medications Prior to Visit  Medication Sig Dispense Refill   amLODipine  (NORVASC ) 5 MG tablet TAKE 1 TABLET (5 MG TOTAL) BY MOUTH DAILY. 90 tablet 1   bictegravir-emtricitabine -tenofovir  AF (BIKTARVY ) 50-200-25 MG TABS tablet Take 1 tablet by mouth daily. 30 tablet 3   folic acid  (FOLVITE ) 1 MG tablet TAKE 1 TABLET BY MOUTH EVERY DAY 90 tablet 1   ketoconazole  (NIZORAL ) 2 % cream Apply 1 Application topically daily. Apply 1gm daily to skin on both feet. 60 g 2   Multiple Vitamin (MULTIVITAMIN WITH MINERALS) TABS  tablet Take 1 tablet by mouth daily. 30 tablet 1   OLANZapine  zydis (ZYPREXA ) 10 MG disintegrating tablet Take 1 tablet (10 mg total) by mouth at bedtime. 30 tablet 0   OLANZapine  zydis (ZYPREXA ) 5 MG disintegrating tablet Take 1 tablet (5 mg total) by mouth 2 (two) times daily. Take 1 tablet in the morning and 1 tablet in the afternoon. 60 tablet 0   rosuvastatin  (CRESTOR ) 5 MG tablet TAKE 1 TABLET (5 MG TOTAL) BY MOUTH DAILY. 90 tablet 1   sulfamethoxazole -trimethoprim  (BACTRIM  DS) 800-160 MG tablet Take 1 tablet by mouth daily. 30 tablet 3   meloxicam  (MOBIC ) 7.5 MG tablet TAKE 1 TABLET BY MOUTH EVERY DAY 30 tablet 0   Sofosbuvir -Velpatasvir  (EPCLUSA ) 400-100 MG TABS Take 1 tablet by mouth daily. 28 tablet 3   No facility-administered medications prior to visit.    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 No pertinent family history.  SOCIAL HISTORY: Social History   Socioeconomic History  Marital status: Single    Spouse name: Not on file   Number of children: Not on file   Years of education: Not on file   Highest education level: Not on file  Occupational History   Not on file  Tobacco Use   Smoking status: Some Days   Smokeless tobacco: Never  Vaping Use   Vaping status: Never Used  Substance and Sexual Activity   Alcohol use: Yes    Comment: 6 pack a day    Drug use: Yes    Types: Marijuana    Comment: daily   Sexual activity: Not on file    Comment: declined condoms  Other Topics Concern   Not on file  Social History Narrative   Not on file   Social Drivers of Health   Financial Resource Strain: Not on File (05/16/2021)   Received from [employer]    Financial Resource Strain: 0  Food Insecurity: No Food Insecurity (03/17/2023)   Hunger Vital Sign    Worried About Running Out of Food in the Last Year: Never true    Ran Out of Food in the Last Year: Never true  Transportation Needs: No Transportation Needs (03/17/2023)   PRAPARE - Administrator, Civil Service (Medical): No    Lack of Transportation (Non-Medical): No  Physical Activity: Not on File (05/16/2021)   Received from [facility]   Physical Activity    Physical Activity: 0  Stress: Not on File (05/16/2021)   Received from [facility]   Stress    Stress: 0  Social Connections: Not on File (10/11/2022)   Received from [facility]   Social Connections    Connectedness: 0  Intimate Partner Violence: Patient Unable To Answer (03/17/2023)   Humiliation, Afraid, Rape, and Kick questionnaire    Fear of Current or Ex-Partner: Patient unable to answer    Emotionally Abused: Patient unable to answer    Physically Abused: Patient unable to answer    Sexually Abused: Patient unable to answer     PHYSICAL EXAM  GENERAL EXAM/CONSTITUTIONAL: Vitals:  Vitals:   11/17/23 0927  BP: 128/77  Pulse: 87  Weight: 154 lb (69.9 kg)  Height: 5' 7 (1.702 m)   Body mass index is 24.12 kg/m. Wt Readings from Last 3 Encounters:  11/17/23 154 lb (69.9 kg)  07/09/23 144 lb (65.3 kg)  04/22/23 145 lb (65.8 kg)   Patient is in no distress; well developed, nourished and groomed; neck is supple  CARDIOVASCULAR: Examination of carotid arteries is normal; no carotid bruits Regular rate and rhythm, no murmurs Examination of peripheral vascular system by observation and  palpation is normal  EYES: Ophthalmoscopic exam of optic discs and posterior segments is normal; no papilledema or hemorrhages; SCLERAL INJECTION No results found.  MUSCULOSKELETAL: Gait, strength, tone, movements noted in Neurologic exam below  NEUROLOGIC: MENTAL STATUS:     11/17/2023    9:42 AM  MMSE - Mini Mental State Exam  Orientation to time 2  Orientation to Place 3  Registration 3  Attention/ Calculation 0  Recall 0  Language- name 2 objects 2  Language- repeat 1  Language- follow 3 step command 3  Language- read & follow direction 1  Write a sentence 1  Copy design 0  Total score 16      11/17/2023   10:00 AM  Montreal Cognitive Assessment   Visuospatial/ Executive (0/5) 0  Naming (0/3) 3  Attention: Read list of digits (0/2)  1  Attention: Read list of letters (0/1) 1  Attention: Serial 7 subtraction starting at 100 (0/3) 0  Language: Repeat phrase (0/2) 0  Language : Fluency (0/1) 0  Abstraction (0/2) 0  Delayed Recall (0/5) 0  Orientation (0/6) 3  Total 8  Adjusted Score (based on education) 9   awake, alert, oriented to person DECR MEMORY, ATTENTION, FLUENCY; comprehension intact, naming intact fund of knowledge appropriate  CRANIAL NERVE:  2nd - no papilledema on fundoscopic exam 2nd, 3rd, 4th, 6th - pupils equal and reactive to light, visual fields full to confrontation, extraocular muscles intact, no nystagmus 5th - facial sensation symmetric 7th - facial strength symmetric 8th - hearing intact 9th - palate elevates symmetrically, uvula midline 11th - shoulder shrug symmetric 12th - tongue protrusion midline  MOTOR:  normal bulk and tone, DIFFUSE 4+ strength in the BUE, BLE; EXCEPT RIGHT FOOT DF 4  SENSORY:  normal and symmetric to light touch, temperature, vibration  COORDINATION:  finger-nose-finger, fine finger movements normal  REFLEXES:  deep tendon reflexes TRACE and symmetric  GAIT/STATION:  narrow based  gait     DIAGNOSTIC DATA (LABS, IMAGING, TESTING) - I reviewed patient records, labs, notes, testing and imaging myself where available.  Lab Results  Component Value Date   WBC 3.6 (L) 06/04/2023   HGB 14.0 06/04/2023   HCT 40.8 06/04/2023   MCV 96.7 06/04/2023   PLT 85 (L) 06/04/2023      Component Value Date/Time   NA 137 06/04/2023 1015   K 3.7 06/04/2023 1015   CL 109 06/04/2023 1015   CO2 24 06/04/2023 1015   GLUCOSE 98 06/04/2023 1015   BUN 15 06/04/2023 1015   CREATININE 1.13 06/04/2023 1015   CALCIUM  9.1 06/04/2023 1015   PROT 8.3 (H) 06/04/2023 1015   ALBUMIN  2.2 (L) 03/28/2023 0714   AST 29 06/04/2023 1015   ALT 11 06/04/2023 1015   ALKPHOS 79 03/28/2023 0714   BILITOT 1.4 (H) 06/04/2023 1015   GFRNONAA >60 03/28/2023 0714   GFRAA >60 05/25/2018 2115   Lab Results  Component Value Date   CHOL 120 02/05/2023   HDL 14 (L) 02/05/2023   LDLCALC 79 02/05/2023   TRIG 136 02/05/2023   CHOLHDL 8.6 02/05/2023   No results found for: HGBA1C Lab Results  Component Value Date   VITAMINB12 534 03/02/2023   Lab Results  Component Value Date   TSH 4.140 02/04/2023    02/05/23 MRI brain (with and without) [I reviewed images myself and agree with interpretation. -VRP]  1. Motion limited study without evidence of acute intracranial abnormality. 2. Age advanced cerebral atrophy (ICD10-G31.9).    ASSESSMENT AND PLAN  58 y.o. year old male here with HIV / AIDS, HCV, history of alcohol abuse here for evaluation of memory/cognitive decline.  Dx:  1. 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 - aim for at least 7-8 hours sleep per night (or more) - avoid smoking and  alcohol - needs supervision of medications, finances; no driving - safety / supervision issues reviewed  Return for return to PCP, return to referring provider.    EDUARD FABIENE HANLON, MD 11/17/2023, 1:55 PM Certified in Neurology, Neurophysiology and Neuroimaging  [facility] [address], [address] [place], [place] [address] [phone]

## 2023-11-20 ENCOUNTER — Other Ambulatory Visit: Payer: Self-pay | Admitting: Pharmacist

## 2023-11-20 NOTE — Progress Notes (Signed)
 Specialty Pharmacy Ongoing Clinical Assessment Note  Russell [name] is a 58 y.o. male who is being followed by the specialty pharmacy service for Multiple active episodes found   Patient's specialty medication(s) reviewed today: Bictegravir-Emtricitab-Tenofov (Biktarvy ); Sofosbuvir -Velpatasvir    Missed doses in the last 4 weeks: 0   Patient/Caregiver did not have any additional questions or concerns.   Therapeutic benefit summary: Patient is achieving benefit   Adverse events/side effects summary: No adverse events/side effects   Patient's therapy is appropriate to: Continue    Goals Addressed             This Visit's Progress    Achieve Undetectable HIV Viral Load < 20   On track    Patient is on track. Patient will maintain adherence. Labs 06/04/23 VL undetectable.     COMPLETED: Achieve virologic cure as evidenced by SVR       Patient is initiating therapy. Patient will be evaluated at upcoming provider appointment to assess progress      Comply with lab assessments   On track    Patient is initiating therapy. Patient will adhere to provider and/or lab appointments      Increase CD4 count until steady state   On track    Patient is not on track and improving. Patient will be evaluated at upcoming provider appointment to assess progress      Maintain optimal adherence to therapy   On track    Patient is initiating therapy. Patient will be evaluated at upcoming provider appointment to assess progress         Follow up: 6 months for HIV - none required for HCV as therapy completed  Alan JINNY Geralds Specialty Pharmacist

## 2023-11-23 ENCOUNTER — Ambulatory Visit (INDEPENDENT_AMBULATORY_CARE_PROVIDER_SITE_OTHER): Admitting: Podiatry

## 2023-11-23 ENCOUNTER — Encounter: Payer: Self-pay | Admitting: Podiatry

## 2023-11-23 DIAGNOSIS — B351 Tinea unguium: Secondary | ICD-10-CM

## 2023-11-23 DIAGNOSIS — D696 Thrombocytopenia, unspecified: Secondary | ICD-10-CM

## 2023-11-23 DIAGNOSIS — M79675 Pain in left toe(s): Secondary | ICD-10-CM

## 2023-11-23 DIAGNOSIS — M79674 Pain in right toe(s): Secondary | ICD-10-CM

## 2023-11-23 NOTE — Progress Notes (Signed)
 This patient present to the office saying he had his nails trimmed about three weeks ago.  He says there is no pain or discomfort.  He requests not to be seen today.  Onychomycosis  Told to return in 2 months.   Cordella Bold DPM

## 2023-12-11 ENCOUNTER — Other Ambulatory Visit: Payer: Self-pay

## 2023-12-11 NOTE — Progress Notes (Signed)
 Specialty Pharmacy Refill Coordination Note  Russell [name] is a 58 y.o. male contacted today regarding refills of specialty medication(s) Bictegravir-Emtricitab-Tenofov (Biktarvy )   Patient requested [name] at [facility] at [place] date: 12/17/23   Medication will be filled on: 12/16/23  Spoke with patient case worker - Actor

## 2023-12-15 ENCOUNTER — Other Ambulatory Visit: Payer: Self-pay

## 2023-12-23 ENCOUNTER — Other Ambulatory Visit: Payer: Self-pay

## 2023-12-23 ENCOUNTER — Ambulatory Visit

## 2024-01-07 ENCOUNTER — Ambulatory Visit: Admitting: Internal Medicine

## 2024-01-15 ENCOUNTER — Other Ambulatory Visit (HOSPITAL_COMMUNITY): Payer: Self-pay

## 2024-01-15 ENCOUNTER — Other Ambulatory Visit: Payer: Self-pay

## 2024-01-15 ENCOUNTER — Other Ambulatory Visit: Payer: Self-pay | Admitting: Internal Medicine

## 2024-01-15 DIAGNOSIS — B20 Human immunodeficiency virus [HIV] disease: Secondary | ICD-10-CM

## 2024-01-15 MED ORDER — BIKTARVY 50-200-25 MG PO TABS
1.0000 | ORAL_TABLET | Freq: Every day | ORAL | 1 refills | Status: DC
  Filled 2024-01-15 – 2024-01-18 (×2): qty 30, 30d supply, fill #0

## 2024-01-18 ENCOUNTER — Other Ambulatory Visit: Payer: Self-pay

## 2024-01-18 ENCOUNTER — Other Ambulatory Visit (HOSPITAL_COMMUNITY): Payer: Self-pay

## 2024-01-18 NOTE — Progress Notes (Signed)
 Specialty Pharmacy Refill Coordination Note  Russell [name] is a 58 y.o. male contacted today regarding refills of specialty medication(s) Bictegravir-Emtricitab-Tenofov (Biktarvy )   Patient requested [name] at [facility] at [place] date: 01/19/24   Medication will be filled on: 01/18/24

## 2024-01-26 ENCOUNTER — Other Ambulatory Visit (HOSPITAL_COMMUNITY): Payer: Self-pay

## 2024-01-27 ENCOUNTER — Encounter: Payer: Self-pay | Admitting: Podiatry

## 2024-01-27 ENCOUNTER — Ambulatory Visit (INDEPENDENT_AMBULATORY_CARE_PROVIDER_SITE_OTHER): Admitting: Podiatry

## 2024-01-27 VITALS — Ht 67.0 in | Wt 154.0 lb

## 2024-01-27 DIAGNOSIS — B351 Tinea unguium: Secondary | ICD-10-CM

## 2024-01-27 DIAGNOSIS — M79675 Pain in left toe(s): Secondary | ICD-10-CM | POA: Diagnosis not present

## 2024-01-27 DIAGNOSIS — D696 Thrombocytopenia, unspecified: Secondary | ICD-10-CM | POA: Diagnosis not present

## 2024-01-27 DIAGNOSIS — M79674 Pain in right toe(s): Secondary | ICD-10-CM | POA: Diagnosis not present

## 2024-01-27 NOTE — Progress Notes (Signed)
 This patient returns to my office for at risk foot care.  This patient requires this care by a professional since this patient will be at risk due to having thrombocytopenia. This patient is unable to cut nails himself since the patient cannot reach his nails.These nails are painful walking and wearing shoes.   He requests treatment only on left foot.  He also requests moisturizer. This patient presents for at risk foot care today.  General Appearance  Alert, conversant and in no acute stress.  Vascular  Dorsalis pedis and posterior tibial  pulses are palpable  bilaterally.  Capillary return is within normal limits  bilaterally. Temperature is within normal limits  bilaterally.  Neurologic  Senn-Weinstein monofilament wire test within normal limits  bilaterally. Muscle power within normal limits bilaterally.  Nails Thick disfigured discolored nails with subungual debris  from hallux to fifth toes left foot.   No evidence of bacterial infection or drainage bilaterally.   Orthopedic  No limitations of motion  feet .  No crepitus or effusions noted.  No bony pathology or digital deformities noted.  Skin  normotropic skin with no porokeratosis noted bilaterally.  No signs of infections or ulcers noted.     Onychomycosis  Pain in right toes  Pain in left toes  Consent was obtained for treatment procedures.   Mechanical debridement of nails 1-5  left performed with a nail nipper.  Filed with dremel without incident.    Return office visit    12 weeks                  Told patient to return for periodic foot care and evaluation due to potential at risk complications.   Cordella Bold DPM

## 2024-02-15 ENCOUNTER — Other Ambulatory Visit: Payer: Self-pay

## 2024-02-15 ENCOUNTER — Ambulatory Visit: Payer: Self-pay | Admitting: Internal Medicine

## 2024-02-15 ENCOUNTER — Other Ambulatory Visit (HOSPITAL_COMMUNITY): Payer: Self-pay

## 2024-02-15 VITALS — BP 132/81 | HR 89 | Temp 97.4°F | Ht 67.0 in | Wt 152.2 lb

## 2024-02-15 DIAGNOSIS — Z113 Encounter for screening for infections with a predominantly sexual mode of transmission: Secondary | ICD-10-CM

## 2024-02-15 DIAGNOSIS — Z79899 Other long term (current) drug therapy: Secondary | ICD-10-CM | POA: Diagnosis not present

## 2024-02-15 DIAGNOSIS — B1920 Unspecified viral hepatitis C without hepatic coma: Secondary | ICD-10-CM

## 2024-02-15 DIAGNOSIS — B20 Human immunodeficiency virus [HIV] disease: Secondary | ICD-10-CM

## 2024-02-15 MED ORDER — BICTEGRAVIR-EMTRICITAB-TENOFOV 50-200-25 MG PO TABS
1.0000 | ORAL_TABLET | Freq: Every day | ORAL | 11 refills | Status: AC
  Filled 2024-02-15 – 2024-02-24 (×3): qty 30, 30d supply, fill #0

## 2024-02-15 NOTE — Patient Instructions (Signed)
 Vaccine: Hepatitis b shot 1 of 2 today (next shot in 2 months)   See me again 2 months to recheck hepatitis c level to see if you are cured And will do shot 2 of hepatitis b that time   I have renewed your biktarvy 

## 2024-02-15 NOTE — Addendum Note (Signed)
 Addended by: CELESTIA LELA HERO on: 02/15/2024 01:18 PM   Modules accepted: Orders

## 2024-02-15 NOTE — Progress Notes (Addendum)
 "  Subjective:    Patient ID: Russell [name], male    DOB: 04-12-1965, 59 y.o.   MRN: [id number]  HPI Shammond is here to establish care for HIV and for hospital follow-up. He was initially hospitalized in January for pneumonia and found to have streptococcal pneumonia bacteremia.  He was also found to be HIV positive with a low CD4 count of 116.  He was treated for the bacteremia.  He also had a positive RPR though T. pallidum antibody was negative.  He was given treatment for late latent syphilis.  An MRI of his brain did note some age advanced cerebral atrophy consistent with HIV dementia.  He is here with his sister who is his caregiver.  He is taking his medications.  Has not yet established with a primary physician.   07/09/23 id clinic f/u Patient staying with his sister who prepares his medication Patient doesn't remember his medications Patient is not working He has not complaint today He is not sexually active (at least not this year). heterosexual  Reviewed labs -- hcv rna positive/gt 1; he didn't know about hep c   02/15/24 id clinic f/u Finished hep c tx 2 months ago No missed dose biktarvy  last 4 weeks No complaint  Review of Systems  Constitutional:  Negative for fatigue.  Gastrointestinal:  Negative for diarrhea and nausea.  Skin:  Negative for rash.       Objective:   Physical Exam Eyes:     General: No scleral icterus. Pulmonary:     Effort: Pulmonary effort is normal.  Neurological:     Mental Status: He is alert.   SH: + tobacco -- quit doesn't remember date Substance -- no drugs/etoh  Labs: Lab Results  Component Value Date   WBC 3.6 (L) 06/04/2023   HGB 14.0 06/04/2023   HCT 40.8 06/04/2023   MCV 96.7 06/04/2023   PLT 85 (L) 06/04/2023   Last metabolic panel Lab Results  Component Value Date   GLUCOSE 98 06/04/2023   NA 137 06/04/2023   K 3.7 06/04/2023   CL 109 06/04/2023   CO2 24 06/04/2023   BUN 15 06/04/2023   CREATININE 1.13  06/04/2023   EGFR 74 06/04/2023   CALCIUM  9.1 06/04/2023   PHOS 3.0 03/02/2023   PROT 8.3 (H) 06/04/2023   ALBUMIN  2.2 (L) 03/28/2023   BILITOT 1.4 (H) 06/04/2023   ALKPHOS 79 03/28/2023   AST 29 06/04/2023   ALT 11 06/04/2023   ANIONGAP 9 03/28/2023   Imaging: Reviewed       Assessment & Plan:   Problem List Items Addressed This Visit   None Visit Diagnoses       HIV disease (HCC)    -  Primary   Relevant Medications   bictegravir-emtricitabine -tenofovir  AF (BIKTARVY ) 50-200-25 MG TABS tablet   Other Relevant Orders   CBC with Differential/Platelet   COMPLETE METABOLIC PANEL WITHOUT GFR   HIV-1 RNA quant-no reflex-bld   T-helper cell (CD4)- ([facility] clinic only)     Screening for STDs (sexually transmitted diseases)       Relevant Orders   RPR W/RFLX TO RPR TITER, TREPONEMAL AB, SCREEN AND DIAGNOSIS   Urine cytology ancillary only       #hiv #cad risk Dx'ed 01/2023 Heterosexual No ivdu hx Cd4 nadir 116 Bactrim  prophy  Well controlled on biktarvy    -he is on rosuvastatin  as per reprieve trial recommendation -discussed u=u -encourage compliance -continue current HIV medication biktarvy  -labs reviewed -f/u in  6 months  #social Lives with sister Not sexually active Not working On disability   #hcv Gt1 On epclusa ; started 05/08/23; planned 6 months for decompensated cirrhosis based on fib-4  Pharmacy team following F/u pharmacy as discussed -- chart forwarded to pharmacy team  Finished 6 months epclusa  by 11/20/23  -Will recheck hep c viral load next visit in 2 months   #hcm -vaccine Prevnar 20 @ 06/04/23 Tdap @ 06/04/23 -hepatitis 01/2023 hep b serology negative; first heplisav will do next visit (was going to give today but patient had to leave) -std No results found for: RPR Not sexually active  -cancer screening Defer to pcp    Follow up: Return in about 2 months (around 04/14/2024).   "

## 2024-02-16 LAB — T-HELPER CELL (CD4) - (RCID CLINIC ONLY)
CD4 % Helper T Cell: 18 % — ABNORMAL LOW (ref 33–65)
CD4 T Cell Abs: 267 /uL — ABNORMAL LOW (ref 400–1790)

## 2024-02-17 LAB — COMPLETE METABOLIC PANEL WITHOUT GFR
AG Ratio: 0.9 (calc) — ABNORMAL LOW (ref 1.0–2.5)
ALT: 9 U/L (ref 9–46)
AST: 24 U/L (ref 10–35)
Albumin: 4.1 g/dL (ref 3.6–5.1)
Alkaline phosphatase (APISO): 97 U/L (ref 35–144)
BUN/Creatinine Ratio: 13 (calc) (ref 6–22)
BUN: 18 mg/dL (ref 7–25)
CO2: 21 mmol/L (ref 20–32)
Calcium: 9.1 mg/dL (ref 8.6–10.3)
Chloride: 109 mmol/L (ref 98–110)
Creat: 1.39 mg/dL — ABNORMAL HIGH (ref 0.70–1.35)
Globulin: 4.4 g/dL — ABNORMAL HIGH (ref 1.9–3.7)
Glucose, Bld: 89 mg/dL (ref 65–99)
Potassium: 3.9 mmol/L (ref 3.5–5.3)
Sodium: 140 mmol/L (ref 135–146)
Total Bilirubin: 1.1 mg/dL (ref 0.2–1.2)
Total Protein: 8.5 g/dL — ABNORMAL HIGH (ref 6.1–8.1)

## 2024-02-17 LAB — CBC WITH DIFFERENTIAL/PLATELET
Absolute Lymphocytes: 1617 {cells}/uL (ref 850–3900)
Absolute Monocytes: 382 {cells}/uL (ref 200–950)
Basophils Absolute: 21 {cells}/uL (ref 0–200)
Basophils Relative: 0.5 %
Eosinophils Absolute: 181 {cells}/uL (ref 15–500)
Eosinophils Relative: 4.3 %
HCT: 46.5 % (ref 39.4–51.1)
Hemoglobin: 15.6 g/dL (ref 13.2–17.1)
MCH: 32.7 pg (ref 27.0–33.0)
MCHC: 33.5 g/dL (ref 31.6–35.4)
MCV: 97.5 fL (ref 81.4–101.7)
MPV: 11.6 fL (ref 7.5–12.5)
Monocytes Relative: 9.1 %
Neutro Abs: 1999 {cells}/uL (ref 1500–7800)
Neutrophils Relative %: 47.6 %
Platelets: 116 Thousand/uL — ABNORMAL LOW (ref 140–400)
RBC: 4.77 Million/uL (ref 4.20–5.80)
RDW: 13.4 % (ref 11.0–15.0)
Total Lymphocyte: 38.5 %
WBC: 4.2 Thousand/uL (ref 3.8–10.8)

## 2024-02-17 LAB — HIV-1 RNA QUANT-NO REFLEX-BLD
HIV 1 RNA Quant: <20 {copies}/mL — AB
HIV-1 RNA Quant, Log: <1.3 {Log_copies}/mL — AB

## 2024-02-17 LAB — SYPHILIS: RPR W/REFLEX TO RPR TITER AND TREPONEMAL ANTIBODIES, TRADITIONAL SCREENING AND DIAGNOSIS ALGORITHM: RPR Ser Ql: NONREACTIVE

## 2024-02-18 ENCOUNTER — Other Ambulatory Visit: Payer: Self-pay

## 2024-02-22 ENCOUNTER — Other Ambulatory Visit: Payer: Self-pay

## 2024-02-23 ENCOUNTER — Ambulatory Visit: Payer: Self-pay | Admitting: Internal Medicine

## 2024-02-24 ENCOUNTER — Other Ambulatory Visit: Payer: Self-pay

## 2024-02-24 NOTE — Progress Notes (Signed)
 Specialty Pharmacy Refill Coordination Note  Russell [name] is a 59 y.o. male, patients case manager was contacted today regarding refills of specialty medication(s) Bictegravir-Emtricitab-Tenofov (BIKTARVY )   Patient requested [name] at [facility] at [place] date: 02/24/24   Medication will be filled on: 02/24/24

## 2024-04-21 ENCOUNTER — Ambulatory Visit: Payer: Self-pay | Admitting: Internal Medicine

## 2024-04-27 ENCOUNTER — Ambulatory Visit: Admitting: Podiatry
